# Patient Record
Sex: Female | Born: 1950 | Race: White | Hispanic: No | Marital: Married | Smoking: Never smoker
Health system: Southern US, Community
[De-identification: ages and names within clinical notes are randomized; demographics above are authoritative.]

## PROBLEM LIST (undated history)

## (undated) DIAGNOSIS — T7840XA Allergy, unspecified, initial encounter: Secondary | ICD-10-CM

## (undated) DIAGNOSIS — N189 Chronic kidney disease, unspecified: Secondary | ICD-10-CM

## (undated) DIAGNOSIS — E785 Hyperlipidemia, unspecified: Secondary | ICD-10-CM

## (undated) DIAGNOSIS — M1990 Unspecified osteoarthritis, unspecified site: Secondary | ICD-10-CM

## (undated) DIAGNOSIS — K219 Gastro-esophageal reflux disease without esophagitis: Secondary | ICD-10-CM

## (undated) DIAGNOSIS — H269 Unspecified cataract: Secondary | ICD-10-CM

## (undated) DIAGNOSIS — K645 Perianal venous thrombosis: Secondary | ICD-10-CM

## (undated) DIAGNOSIS — H8109 Meniere's disease, unspecified ear: Secondary | ICD-10-CM

## (undated) DIAGNOSIS — Z780 Asymptomatic menopausal state: Secondary | ICD-10-CM

## (undated) DIAGNOSIS — H2510 Age-related nuclear cataract, unspecified eye: Secondary | ICD-10-CM

## (undated) HISTORY — DX: Chronic kidney disease, unspecified: N18.9

## (undated) HISTORY — DX: Unspecified osteoarthritis, unspecified site: M19.90

## (undated) HISTORY — DX: Gastro-esophageal reflux disease without esophagitis: K21.9

## (undated) HISTORY — DX: Hyperlipidemia, unspecified: E78.5

## (undated) HISTORY — DX: Asymptomatic menopausal state: Z78.0

## (undated) HISTORY — DX: Meniere's disease, unspecified ear: H81.09

## (undated) HISTORY — DX: Perianal venous thrombosis: K64.5

## (undated) HISTORY — DX: Allergy, unspecified, initial encounter: T78.40XA

---
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
## 1999-05-05 ENCOUNTER — Other Ambulatory Visit: Admission: RE | Admit: 1999-05-05 | Discharge: 1999-05-05 | Payer: Self-pay | Admitting: Obstetrics and Gynecology

## 2000-05-10 ENCOUNTER — Other Ambulatory Visit: Admission: RE | Admit: 2000-05-10 | Discharge: 2000-05-10 | Payer: Self-pay | Admitting: Obstetrics and Gynecology

## 2004-02-05 ENCOUNTER — Ambulatory Visit: Payer: Self-pay | Admitting: Internal Medicine

## 2004-02-16 ENCOUNTER — Ambulatory Visit: Payer: Self-pay | Admitting: Internal Medicine

## 2004-03-26 ENCOUNTER — Ambulatory Visit: Payer: Self-pay | Admitting: Internal Medicine

## 2004-04-02 ENCOUNTER — Ambulatory Visit: Payer: Self-pay | Admitting: Internal Medicine

## 2004-04-02 ENCOUNTER — Ambulatory Visit: Payer: Self-pay

## 2004-04-14 ENCOUNTER — Ambulatory Visit: Payer: Self-pay | Admitting: Internal Medicine

## 2004-04-29 ENCOUNTER — Ambulatory Visit: Payer: Self-pay | Admitting: Internal Medicine

## 2004-07-15 ENCOUNTER — Ambulatory Visit: Payer: Self-pay | Admitting: Internal Medicine

## 2004-07-30 ENCOUNTER — Ambulatory Visit: Payer: Self-pay | Admitting: Internal Medicine

## 2004-08-13 ENCOUNTER — Encounter: Payer: Self-pay | Admitting: Internal Medicine

## 2004-08-13 ENCOUNTER — Ambulatory Visit: Payer: Self-pay | Admitting: Internal Medicine

## 2004-08-13 ENCOUNTER — Encounter (INDEPENDENT_AMBULATORY_CARE_PROVIDER_SITE_OTHER): Payer: Self-pay | Admitting: Specialist

## 2004-08-13 LAB — HM COLONOSCOPY

## 2004-08-20 ENCOUNTER — Ambulatory Visit: Payer: Self-pay | Admitting: Internal Medicine

## 2004-09-30 ENCOUNTER — Ambulatory Visit: Payer: Self-pay | Admitting: Internal Medicine

## 2004-12-16 ENCOUNTER — Ambulatory Visit: Payer: Self-pay | Admitting: Internal Medicine

## 2004-12-23 ENCOUNTER — Ambulatory Visit: Payer: Self-pay | Admitting: Family Medicine

## 2005-01-08 ENCOUNTER — Ambulatory Visit: Payer: Self-pay | Admitting: Family Medicine

## 2005-02-10 ENCOUNTER — Ambulatory Visit: Payer: Self-pay | Admitting: Internal Medicine

## 2005-02-21 ENCOUNTER — Ambulatory Visit: Payer: Self-pay | Admitting: Internal Medicine

## 2005-03-15 ENCOUNTER — Ambulatory Visit: Payer: Self-pay | Admitting: Internal Medicine

## 2005-03-22 ENCOUNTER — Ambulatory Visit: Payer: Self-pay | Admitting: Internal Medicine

## 2005-06-27 ENCOUNTER — Ambulatory Visit: Payer: Self-pay | Admitting: Internal Medicine

## 2005-07-14 ENCOUNTER — Ambulatory Visit: Payer: Self-pay | Admitting: Internal Medicine

## 2005-08-11 ENCOUNTER — Ambulatory Visit: Payer: Self-pay | Admitting: Internal Medicine

## 2005-10-05 ENCOUNTER — Ambulatory Visit: Payer: Self-pay | Admitting: Internal Medicine

## 2005-10-20 ENCOUNTER — Ambulatory Visit: Payer: Self-pay | Admitting: Internal Medicine

## 2006-01-05 ENCOUNTER — Ambulatory Visit: Payer: Self-pay | Admitting: Internal Medicine

## 2006-02-24 ENCOUNTER — Ambulatory Visit: Payer: Self-pay | Admitting: Internal Medicine

## 2006-02-24 LAB — CONVERTED CEMR LAB
AST: 25 units/L (ref 0–37)
Albumin: 3.9 g/dL (ref 3.5–5.2)
Alkaline Phosphatase: 79 units/L (ref 39–117)
Direct LDL: 154.5 mg/dL
Total CHOL/HDL Ratio: 3.9
VLDL: 15 mg/dL (ref 0–40)

## 2006-03-03 ENCOUNTER — Ambulatory Visit: Payer: Self-pay | Admitting: Internal Medicine

## 2006-05-02 ENCOUNTER — Ambulatory Visit: Payer: Self-pay | Admitting: Internal Medicine

## 2006-05-02 LAB — CONVERTED CEMR LAB: Cholesterol: 188 mg/dL (ref 0–200)

## 2006-05-09 ENCOUNTER — Ambulatory Visit: Payer: Self-pay | Admitting: Internal Medicine

## 2006-06-26 ENCOUNTER — Ambulatory Visit: Payer: Self-pay | Admitting: Internal Medicine

## 2006-08-14 ENCOUNTER — Telehealth: Payer: Self-pay | Admitting: Internal Medicine

## 2006-10-23 ENCOUNTER — Telehealth: Payer: Self-pay | Admitting: Internal Medicine

## 2006-10-25 ENCOUNTER — Ambulatory Visit: Payer: Self-pay | Admitting: Internal Medicine

## 2006-10-25 DIAGNOSIS — T887XXA Unspecified adverse effect of drug or medicament, initial encounter: Secondary | ICD-10-CM | POA: Insufficient documentation

## 2006-10-25 DIAGNOSIS — E785 Hyperlipidemia, unspecified: Secondary | ICD-10-CM

## 2006-10-25 LAB — CONVERTED CEMR LAB
ALT: 25 units/L (ref 0–35)
Albumin: 4.1 g/dL (ref 3.5–5.2)
Alkaline Phosphatase: 58 units/L (ref 39–117)
Bilirubin, Direct: 0.1 mg/dL (ref 0.0–0.3)
Cholesterol: 193 mg/dL (ref 0–200)
HDL: 62.2 mg/dL (ref >39.0)
Total Bilirubin: 0.5 mg/dL (ref 0.3–1.2)
Total CHOL/HDL Ratio: 3.1
Total Protein: 6.8 g/dL (ref 6.0–8.3)

## 2006-11-03 ENCOUNTER — Ambulatory Visit: Payer: Self-pay | Admitting: Internal Medicine

## 2006-11-03 LAB — CONVERTED CEMR LAB: HDL goal, serum: 40 mg/dL

## 2006-11-07 ENCOUNTER — Telehealth: Payer: Self-pay | Admitting: *Deleted

## 2006-11-20 ENCOUNTER — Telehealth: Payer: Self-pay | Admitting: Internal Medicine

## 2006-12-02 ENCOUNTER — Telehealth: Payer: Self-pay | Admitting: Internal Medicine

## 2006-12-19 ENCOUNTER — Encounter: Admission: RE | Admit: 2006-12-19 | Discharge: 2006-12-19 | Payer: Self-pay | Admitting: Internal Medicine

## 2006-12-29 ENCOUNTER — Telehealth: Payer: Self-pay | Admitting: Internal Medicine

## 2007-01-08 ENCOUNTER — Ambulatory Visit: Payer: Self-pay | Admitting: Family Medicine

## 2007-01-08 ENCOUNTER — Encounter: Payer: Self-pay | Admitting: Internal Medicine

## 2007-03-22 ENCOUNTER — Telehealth: Payer: Self-pay | Admitting: Internal Medicine

## 2007-05-04 ENCOUNTER — Telehealth: Payer: Self-pay | Admitting: Internal Medicine

## 2007-05-08 ENCOUNTER — Telehealth (INDEPENDENT_AMBULATORY_CARE_PROVIDER_SITE_OTHER): Payer: Self-pay | Admitting: *Deleted

## 2007-05-16 ENCOUNTER — Encounter: Payer: Self-pay | Admitting: Internal Medicine

## 2007-06-07 ENCOUNTER — Ambulatory Visit: Payer: Self-pay | Admitting: Internal Medicine

## 2007-06-07 DIAGNOSIS — F325 Major depressive disorder, single episode, in full remission: Secondary | ICD-10-CM | POA: Insufficient documentation

## 2007-07-19 ENCOUNTER — Ambulatory Visit: Payer: Self-pay | Admitting: Internal Medicine

## 2007-10-01 ENCOUNTER — Ambulatory Visit: Payer: Self-pay | Admitting: Internal Medicine

## 2007-10-01 DIAGNOSIS — M25539 Pain in unspecified wrist: Secondary | ICD-10-CM | POA: Insufficient documentation

## 2007-10-22 ENCOUNTER — Ambulatory Visit: Payer: Self-pay | Admitting: Internal Medicine

## 2007-10-22 LAB — CONVERTED CEMR LAB
Bilirubin Urine: NEGATIVE
Glucose, Urine, Semiquant: NEGATIVE
Ketones, urine, test strip: NEGATIVE
Nitrite: NEGATIVE
Protein, U semiquant: NEGATIVE
Specific Gravity, Urine: 1.02
Urobilinogen, UA: 0.2
pH: 6

## 2007-12-12 ENCOUNTER — Telehealth: Payer: Self-pay | Admitting: Internal Medicine

## 2008-01-09 ENCOUNTER — Ambulatory Visit: Payer: Self-pay | Admitting: Internal Medicine

## 2008-01-09 DIAGNOSIS — K219 Gastro-esophageal reflux disease without esophagitis: Secondary | ICD-10-CM

## 2008-01-09 DIAGNOSIS — K645 Perianal venous thrombosis: Secondary | ICD-10-CM

## 2008-01-09 DIAGNOSIS — K589 Irritable bowel syndrome without diarrhea: Secondary | ICD-10-CM | POA: Insufficient documentation

## 2008-01-09 HISTORY — DX: Perianal venous thrombosis: K64.5

## 2008-01-30 ENCOUNTER — Ambulatory Visit: Payer: Self-pay | Admitting: Internal Medicine

## 2008-01-30 LAB — CONVERTED CEMR LAB
Albumin: 4.2 g/dL (ref 3.5–5.2)
BUN: 23 mg/dL (ref 6–23)
Calcium: 9.9 mg/dL (ref 8.4–10.5)
Chloride: 105 meq/L (ref 96–112)

## 2008-02-06 ENCOUNTER — Ambulatory Visit: Payer: Self-pay | Admitting: Internal Medicine

## 2008-04-04 ENCOUNTER — Ambulatory Visit: Payer: Self-pay | Admitting: Internal Medicine

## 2008-04-07 ENCOUNTER — Telehealth: Payer: Self-pay | Admitting: Internal Medicine

## 2008-06-19 ENCOUNTER — Telehealth (INDEPENDENT_AMBULATORY_CARE_PROVIDER_SITE_OTHER): Payer: Self-pay | Admitting: *Deleted

## 2008-06-30 ENCOUNTER — Ambulatory Visit: Payer: Self-pay | Admitting: Internal Medicine

## 2008-07-08 ENCOUNTER — Encounter: Payer: Self-pay | Admitting: Internal Medicine

## 2008-07-30 ENCOUNTER — Encounter: Payer: Self-pay | Admitting: Internal Medicine

## 2008-08-08 ENCOUNTER — Ambulatory Visit: Payer: Self-pay | Admitting: Internal Medicine

## 2008-08-08 LAB — CONVERTED CEMR LAB
Cholesterol: 183 mg/dL (ref 0–200)
Direct LDL: 90.3 mg/dL
HDL: 72.4 mg/dL (ref >39.00)

## 2008-08-12 ENCOUNTER — Encounter: Payer: Self-pay | Admitting: Internal Medicine

## 2008-08-19 ENCOUNTER — Telehealth (INDEPENDENT_AMBULATORY_CARE_PROVIDER_SITE_OTHER): Payer: Self-pay | Admitting: *Deleted

## 2008-08-29 ENCOUNTER — Encounter: Payer: Self-pay | Admitting: Internal Medicine

## 2008-10-17 LAB — CONVERTED CEMR LAB: Pap Smear: NORMAL

## 2008-10-29 ENCOUNTER — Encounter: Payer: Self-pay | Admitting: Internal Medicine

## 2008-11-03 ENCOUNTER — Ambulatory Visit: Payer: Self-pay | Admitting: Internal Medicine

## 2008-11-03 LAB — CONVERTED CEMR LAB
AST: 25 units/L (ref 0–37)
Albumin: 4.4 g/dL (ref 3.5–5.2)
Alkaline Phosphatase: 46 units/L (ref 39–117)
BUN: 25 mg/dL — ABNORMAL HIGH (ref 6–23)
Bilirubin, Direct: 0 mg/dL (ref 0.0–0.3)
Calcium: 9.2 mg/dL (ref 8.4–10.5)
Creatinine, Ser: 1 mg/dL (ref 0.4–1.2)
GFR calc non Af Amer: 60.25 mL/min (ref >60)
Glucose, Bld: 97 mg/dL (ref 70–99)
HCT: 39.6 % (ref 36.0–46.0)
HDL: 72.7 mg/dL (ref >39.00)
LDL Cholesterol: 102 mg/dL — ABNORMAL HIGH (ref 0–99)
Lymphs Abs: 1.4 10*3/uL (ref 0.7–4.0)
MCHC: 33.7 g/dL (ref 30.0–36.0)
MCV: 90.8 fL (ref 78.0–100.0)
Neutro Abs: 1.2 10*3/uL — ABNORMAL LOW (ref 1.4–7.7)
RBC: 4.36 M/uL (ref 3.87–5.11)
RDW: 12.3 % (ref 11.5–14.6)
TSH: 2.96 microintl units/mL (ref 0.35–5.50)
Total Bilirubin: 0.5 mg/dL (ref 0.3–1.2)
Total Protein: 7.4 g/dL (ref 6.0–8.3)
VLDL: 12.8 mg/dL (ref 0.0–40.0)
WBC: 3.1 10*3/uL — ABNORMAL LOW (ref 4.5–10.5)

## 2008-11-10 ENCOUNTER — Ambulatory Visit: Payer: Self-pay | Admitting: Internal Medicine

## 2009-01-28 ENCOUNTER — Ambulatory Visit: Payer: Self-pay | Admitting: Internal Medicine

## 2009-01-28 DIAGNOSIS — J218 Acute bronchiolitis due to other specified organisms: Secondary | ICD-10-CM | POA: Insufficient documentation

## 2009-01-30 ENCOUNTER — Telehealth: Payer: Self-pay | Admitting: Internal Medicine

## 2009-02-11 ENCOUNTER — Encounter: Payer: Self-pay | Admitting: Internal Medicine

## 2009-02-13 ENCOUNTER — Encounter: Payer: Self-pay | Admitting: Internal Medicine

## 2009-02-24 ENCOUNTER — Telehealth: Payer: Self-pay | Admitting: Internal Medicine

## 2009-03-23 ENCOUNTER — Telehealth: Payer: Self-pay | Admitting: Internal Medicine

## 2009-03-23 ENCOUNTER — Ambulatory Visit: Payer: Self-pay | Admitting: Internal Medicine

## 2009-04-14 ENCOUNTER — Ambulatory Visit: Payer: Self-pay | Admitting: Internal Medicine

## 2009-04-14 ENCOUNTER — Encounter: Payer: Self-pay | Admitting: Internal Medicine

## 2009-04-14 LAB — HM DEXA SCAN: HM Dexa Scan: NORMAL

## 2009-04-27 ENCOUNTER — Ambulatory Visit: Payer: Self-pay | Admitting: Internal Medicine

## 2009-04-27 DIAGNOSIS — R5383 Other fatigue: Secondary | ICD-10-CM

## 2009-04-27 DIAGNOSIS — R5381 Other malaise: Secondary | ICD-10-CM

## 2009-04-27 DIAGNOSIS — L659 Nonscarring hair loss, unspecified: Secondary | ICD-10-CM

## 2009-05-07 ENCOUNTER — Telehealth: Payer: Self-pay | Admitting: Internal Medicine

## 2009-05-29 ENCOUNTER — Encounter: Payer: Self-pay | Admitting: Internal Medicine

## 2009-07-22 ENCOUNTER — Encounter (INDEPENDENT_AMBULATORY_CARE_PROVIDER_SITE_OTHER): Payer: Self-pay | Admitting: *Deleted

## 2009-07-22 ENCOUNTER — Telehealth: Payer: Self-pay | Admitting: Internal Medicine

## 2009-09-28 ENCOUNTER — Telehealth: Payer: Self-pay | Admitting: Internal Medicine

## 2009-11-04 ENCOUNTER — Ambulatory Visit: Payer: Self-pay | Admitting: Internal Medicine

## 2009-11-04 LAB — CONVERTED CEMR LAB
AST: 34 units/L (ref 0–37)
Alkaline Phosphatase: 47 units/L (ref 39–117)
Basophils Absolute: 0.1 10*3/uL (ref 0.0–0.1)
Calcium: 10.7 mg/dL — ABNORMAL HIGH (ref 8.4–10.5)
Cholesterol: 193 mg/dL (ref 0–200)
Creatinine, Ser: 1 mg/dL (ref 0.4–1.2)
GFR calc non Af Amer: 60.04 mL/min (ref >60)
MCHC: 34.3 g/dL (ref 30.0–36.0)
Potassium: 4.4 meq/L (ref 3.5–5.1)
RBC: 4.16 M/uL (ref 3.87–5.11)
RDW: 13.8 % (ref 11.5–14.6)
Total Bilirubin: 0.4 mg/dL (ref 0.3–1.2)
Triglycerides: 68 mg/dL (ref 0.0–149.0)
VLDL: 13.6 mg/dL (ref 0.0–40.0)

## 2009-11-24 ENCOUNTER — Ambulatory Visit: Payer: Self-pay | Admitting: Internal Medicine

## 2009-11-25 ENCOUNTER — Telehealth: Payer: Self-pay | Admitting: Internal Medicine

## 2010-01-27 ENCOUNTER — Ambulatory Visit
Admission: RE | Admit: 2010-01-27 | Discharge: 2010-01-27 | Payer: Self-pay | Source: Home / Self Care | Attending: Internal Medicine | Admitting: Internal Medicine

## 2010-01-28 ENCOUNTER — Telehealth: Payer: Self-pay | Admitting: Internal Medicine

## 2010-02-05 ENCOUNTER — Telehealth: Payer: Self-pay | Admitting: Internal Medicine

## 2010-02-16 NOTE — Progress Notes (Signed)
Summary: numbness  Phone Note Call from Patient   Caller: Patient Call For: Stacie Glaze MD Summary of Call: Pt states she has had numbness of top of left ear that radiates to the back of her neck with a "pulling".  Her lips feel slightly numb on the left side also.  Symptoms started Sunday morning.  No weakness in extremity or difficulty with speech.  No eye droop.  Smile is symmetrical. Initial call taken by: Lynann Beaver CMA,  February 24, 2009 8:15 AM  Follow-up for Phone Call        could be bells palsy  monter for progressive symtoms is virla and ther is little to do to prevent any arm or leg numbness call back asap Follow-up by: Stacie Glaze MD,  February 24, 2009 10:02 AM  Additional Follow-up for Phone Call Additional follow up Details #1::        Pt. given Dr. York Ram recommendations, and she understands. Additional Follow-up by: Lynann Beaver CMA,  February 24, 2009 10:13 AM

## 2010-02-16 NOTE — Assessment & Plan Note (Signed)
Summary: HEAD/CHEST CONGESTION/FEELING WEAK/RCD   Vital Signs:  Patient profile:   60 year old female Height:      66 inches Weight:      164 pounds BMI:     26.57 Temp:     98.1 degrees F oral Pulse rate:   72 / minute Resp:     14 per minute BP sitting:   140 / 80  (left arm)  Vitals Entered By: Willy Eddy, LPN (January 28, 2009 9:55 AM) CC: c/o cough and congestion   CC:  c/o cough and congestion.  History of Present Illness: cough and cold for 1 weeks with muscinex and cold eaze, now chest is tight and cough results in asthma ( reactive airway) with wheezing no fever, but has chills cough is clear, slightly productive mild SOB, no leg sweeling of CV symptoms    Preventive Screening-Counseling & Management  Alcohol-Tobacco     Smoking Status: never     Passive Smoke Exposure: no  Problems Prior to Update: 1)  Preventive Health Care  (ICD-V70.0) 2)  Irritable Bowel Syndrome  (ICD-564.1) 3)  Hiatal Hernia With Reflux  (ICD-553.3) 4)  Internal Thrombosed Hemorrhoids  (ICD-455.1) 5)  Uti  (ICD-599.0) 6)  Wrist Pain, Right, Chronic  (ICD-719.43) 7)  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 Once Daily 2)  Mucinex Dm 30-600 Mg  Tb12 (Dextromethorphan-Guaifenesin) .... Two Times A Day As Needed 3)  Zyrtec Allergy 10 Mg  Tabs (Cetirizine Hcl) .... Once Daily 4)  Maxzide-25 37.5-25 Mg  Tabs (Triamterene-Hctz) .... Once Daily 5)  Tricor 145 Mg  Tabs (Fenofibrate) .... Once Daily 6)  Valium 5 Mg  Tabs (Diazepam) .... Two Times A Day As Needed 7)  Astelin 137 Mcg/spray  Soln (Azelastine Hcl) .... 2spray Each Nostril Once Daily 8)  Pristiq 100 Mg Xr24h-Tab (Desvenlafaxine Succinate) .... One By Mouth Daily 9)  Proctocort 30 Mg Supp (Hydrocortisone Acetate) .Marland Kitchen.. 1 Two Times A Day As Needed Hemorrhoidal  Painr 10)  Probiotic  Caps (Misc Intestinal Flora Regulat) .Marland Kitchen.. 1 Once Daily 11)  Ciprofloxacin Hcl 500 Mg Tabs (Ciprofloxacin Hcl) .... One Tablet Twice Daily  Current Medications (verified): 1)  Singulair 10 Mg  Tabs (Montelukast Sodium) .... Once Daily 2)  Mucinex Dm 30-600 Mg  Tb12 (Dextromethorphan-Guaifenesin) .... Two Times A Day As Needed 3)  Zyrtec Allergy 10 Mg  Tabs (Cetirizine Hcl) .... Once Daily 4)  Maxzide-25 37.5-25 Mg  Tabs (Triamterene-Hctz) .... Once Daily 5)  Tricor 145 Mg  Tabs (Fenofibrate) .... Once Daily 6)  Valium 5 Mg  Tabs (Diazepam) .... Two Times A Day As Needed 7)  Astelin 137 Mcg/spray  Soln (Azelastine Hcl) .... 2spray Each Nostril Once Daily 8)  Pristiq 100 Mg Xr24h-Tab (Desvenlafaxine Succinate) .... One By Mouth Daily 9)  Proctocort 30 Mg Supp (Hydrocortisone Acetate) .Marland Kitchen.. 1 Two Times A Day As Needed Hemorrhoidal Painr 10)  Probiotic  Caps (Misc Intestinal Flora Regulat) .Marland Kitchen.. 1 Once Daily 11)  Ciprofloxacin Hcl 500 Mg Tabs (Ciprofloxacin Hcl) .... One Tablet Twice Daily  Allergies (verified): 1)  ! Sulfa 2)  ! Erythromycin  Past History:  Family History: Last updated: 07/17/2006 Family History of Cardiovascular disorder  Social History: Last updated: 07/17/2006 Married Never Smoked Alcohol use-no  Risk Factors: Caffeine Use: 1 (11/03/2006)  Risk Factors: Smoking Status: never (01/28/2009) Passive  Smoke Exposure: no (01/28/2009)  Past medical, surgical, family and social histories (including risk factors) reviewed, and no changes noted (except as noted below).  Past Medical History: Reviewed history from 11/03/2006 and no changes required. Menopause Meniere's dz Hyperlipidemia  Family History: Reviewed history from 07/17/2006 and no changes required. Family History of Cardiovascular disorder  Social History: Reviewed history from 07/17/2006 and no changes required. Married Never Smoked Alcohol use-no  Review of Systems        The patient complains of dyspnea on exertion and prolonged cough.  The patient denies anorexia, fever, weight loss, weight gain, vision loss, decreased hearing, hoarseness, chest pain, syncope, peripheral edema, headaches, hemoptysis, abdominal pain, melena, hematochezia, severe indigestion/heartburn, hematuria, incontinence, genital sores, muscle weakness, suspicious skin lesions, transient blindness, difficulty walking, depression, unusual weight change, abnormal bleeding, enlarged lymph nodes, angioedema, and breast masses.    Physical Exam  General:  Well-developed,well-nourished,in no acute distress; alert,appropriate and cooperative throughout examination Head:  normocephalic and no abnormalities palpated.   Eyes:  pupils equal and pupils round.   Ears:  R ear normal and L ear normal.   Nose:  mucosal erythema, mucosal edema, and airflow obstruction.   Mouth:  posterior lymphoid hypertrophy, postnasal drip, and pharyngeal crowding.   Neck:  No deformities, masses, or tenderness noted. Lungs:  normal respiratory effort, R wheezes, and L wheezes.   Heart:  normal rate and regular rhythm.   Abdomen:  soft and non-tender.   Neurologic:  alert & oriented X3 and gait normal.   Skin:  turgor normal and color normal.   Psych:  Oriented X3 and good eye contact.     Impression & Recommendations:  Problem # 1:  ACUTE BRONCHIOLITIS DUE OTH INFECTIOUS ORGANISMS (ICD-466.19) Assessment New  The following medications were removed from the medication list:    Ciprofloxacin Hcl 500 Mg Tabs (Ciprofloxacin hcl) ..... One tablet twice daily Her updated medication list for this problem includes:    Singulair 10 Mg Tabs (Montelukast sodium) ..... Once daily    Mucinex Dm 30-600 Mg Tb12 (Dextromethorphan-guaifenesin) .Marland Kitchen..Marland Kitchen Two times a day as needed    Clarithromycin 500 Mg Xr24h-tab (Clarithromycin) ..... One by mouth two times a day with food    Hydromet 5-1.5 Mg/27ml Syrp (Hydrocodone-homatropine)  .Marland Kitchen..Marland Kitchen Two tsp by mouth q 6 hours  Orders: Depo- Medrol 80mg  (J1040) Admin of Therapeutic Inj  intramuscular or subcutaneous ([address])  Complete Medication List: 1)  Singulair 10 Mg Tabs (Montelukast sodium) .... Once daily 2)  Mucinex Dm 30-600 Mg Tb12 (Dextromethorphan-guaifenesin) .... Two times a day as needed 3)  Zyrtec Allergy 10 Mg Tabs (Cetirizine hcl) .... Once daily 4)  Maxzide-25 37.5-25 Mg Tabs (Triamterene-hctz) .... Once daily 5)  Tricor 145 Mg Tabs (Fenofibrate) .... Once daily 6)  Valium 5 Mg Tabs (Diazepam) .... Two times a day as needed 7)  Astelin 137 Mcg/spray Soln (Azelastine hcl) .... 2spray each nostril once daily 8)  Pristiq 100 Mg Xr24h-tab (Desvenlafaxine succinate) .... One by mouth daily 9)  Proctocort 30 Mg Supp (Hydrocortisone acetate) .Marland Kitchen.. 1 two times a day as needed hemorrhoidal painr 10)  Probiotic Caps (Misc intestinal flora regulat) .Marland Kitchen.. 1 once daily 11)  Clarithromycin 500 Mg Xr24h-tab (Clarithromycin) .... One by mouth two times a day with food 12)  Methylprednisolone 4 Mg Tabs (Methylprednisolone) .... 5 for one day then 4 for 1 days then 3 for 1 day then 2 for 1 day then 1 for 1 days 13)  Hydromet 5-1.5 Mg/70ml Syrp (  Hydrocodone-homatropine) .... Two tsp by mouth q 6 hours  Patient Instructions: 1)  Take your antibiotic as prescribed until ALL of it is gone, but stop if you develop a rash or swelling and contact our office as soon as possible. Prescriptions: HYDROMET 5-1.5 MG/5ML SYRP (HYDROCODONE-HOMATROPINE) two tsp by mouth q 6 hours  #6 oz x 1   Entered and Authorized by:   Stacie Glaze MD   Signed by:   Stacie Glaze MD on 01/28/2009   Method used:   Print then Give to Patient   RxID:   [id number] METHYLPREDNISOLONE 4 MG TABS (METHYLPREDNISOLONE) 5 for one day then 4 for 1 days then 3 for 1 day then 2 for 1 day then 1 for 1 days  #15 x 0   Entered and Authorized by:   Stacie Glaze MD   Signed by:   Stacie Glaze MD on 01/28/2009    Method used:   Electronically to        [facility]  [phone]* (retail)       [address] [place], [place]  [address]       Ph: [phone] or [phone]       Fax: [phone]   RxID:   [id number] CLARITHROMYCIN 500 MG XR24H-TAB (CLARITHROMYCIN) one by mouth two times a day with food  #20 x 0   Entered and Authorized by:   Stacie Glaze MD   Signed by:   Stacie Glaze MD on 01/28/2009   Method used:   Electronically to        [facility]  [phone]* (retail)       [address] [place], [place]  [address]       Ph: [phone] or [phone]       Fax: [phone]   RxID:   [id number]    Medication Administration  Injection # 1:    Medication: Depo- Medrol 80mg     Diagnosis: ACUTE BRONCHIOLITIS DUE OTH INFECTIOUS ORGANISMS (ICD-466.19)    Route: IM    Site: LUOQ gluteus    Exp Date: 10/27/2009    Lot #: [id number]    Mfr: Oswaldo Done    Given by: Stacie Glaze MD (January 28, 2009 10:48 AM)  Orders Added: 1)  Est. Patient Level IV [37106] 2)  Depo- Medrol 80mg  [J1040] 3)  Admin of Therapeutic Inj  intramuscular or subcutaneous [26948]

## 2010-02-16 NOTE — Medication Information (Signed)
Summary: Coverage Approval for Singulair Tablet  Coverage Approval for Singulair Tablet   Imported By: Maryln Gottron 06/05/2009 10:54:27  _____________________________________________________________________  External Attachment:    Type:   Image     Comment:   External Document

## 2010-02-16 NOTE — Progress Notes (Signed)
Summary: refill  Phone Note Refill Request Call back at Home Phone [phone] Message from:  Patient---live call  Refills Requested: Medication #1:  SINGULAIR 10 MG  TABS once daily fax to [facility]  Initial call taken by: Warnell Forester,  May 07, 2009 1:28 PM    Prescriptions: SINGULAIR 10 MG  TABS (MONTELUKAST SODIUM) once daily  #90 x 3   Entered by:   Willy Eddy, LPN   Authorized by:   Stacie Glaze MD   Signed by:   Willy Eddy, LPN on 29/56/2130   Method used:   Electronically to        [facility]* (mail-order)             ,          Ph: [phone]       Fax: [phone]   RxID:   [id number]

## 2010-02-16 NOTE — Progress Notes (Signed)
Summary: questions x3  Phone Note Call from Patient Call back at Home Phone [phone]   Caller: vm Call For: Zeven Kocak Reason for Call: Acute Illness Summary of Call: Wed ov shot & meds given. 1) Can I take Mucinex with other meds?  2)Am I contagious & how long? Started med Wed.   3)When will I start getting some energy back?  I still don't have any. Initial call taken by: Rudy Jew, RN,  January 30, 2009 11:50 AM  Follow-up for Phone Call        GIve it time-- she needs to rest and take it easy-can take mucinex,(per dr Cato Mulligan) but take as directed--drink plenty of liquids and take it easy Follow-up by: Willy Eddy, LPN,  January 30, 2009 11:53 AM  Additional Follow-up for Phone Call Additional follow up Details #1::        Phone Call Completed Additional Follow-up by: Rudy Jew, RN,  January 30, 2009 2:36 PM

## 2010-02-16 NOTE — Progress Notes (Signed)
Summary: Pt req refill of Valium, Maxzide and Tricor to [facility]  Phone Note Refill Request Call back at Home Phone [phone] Message from:  Patient on July 22, 2009 11:27 AM  Refills Requested: Medication #1:  VALIUM 5 MG  TABS two times a day as needed   Dosage confirmed as above?Dosage Confirmed   Brand Name Necessary? No   Supply Requested: 3 months  Medication #2:  MAXZIDE-25 37.5-25 MG  TABS once daily   Dosage confirmed as above?Dosage Confirmed   Brand Name Necessary? No   Supply Requested: 3 months  Medication #3:  TRICOR 145 MG  TABS once daily   Dosage confirmed as above?Dosage Confirmed   Brand Name Necessary? Yes   Supply Requested: 3 months Please send to [facility] and be sure to include pts member Id and dob.   Fax # [phone]  Phone [phone]   Method Requested: Electronic Initial call taken by: Lucy Antigua,  July 22, 2009 11:29 AM    Prescriptions: VALIUM 5 MG  TABS (DIAZEPAM) two times a day as needed  #180 x 1   Entered by:   Willy Eddy, LPN   Authorized by:   Stacie Glaze MD   Signed by:   Willy Eddy, LPN on 84/69/6295   Method used:   Printed then faxed to ...       [facility]* (retail)             ,          Ph: [phone]       Fax: [phone]   RxID:   [id number] MAXZIDE-25 37.5-25 MG  TABS (TRIAMTERENE-HCTZ) once daily  #90 x 3   Entered by:   Willy Eddy, LPN   Authorized by:   Stacie Glaze MD   Signed by:   Willy Eddy, LPN on 33/29/5188   Method used:   Electronically to        [facility]* (retail)             ,          Ph: [phone]       Fax: [phone]   RxID:   [id number] TRICOR 145 MG  TABS (FENOFIBRATE) once daily  #90 x 3   Entered by:   Willy Eddy, LPN   Authorized by:   Stacie Glaze MD   Signed by:   Willy Eddy, LPN on 83/15/1761   Method used:   Electronically to        [facility]* (retail)             ,           Ph: [phone]       Fax: [phone]   RxID:   [id number]

## 2010-02-16 NOTE — Assessment & Plan Note (Signed)
Summary: uri/bmw   Vital Signs:  Patient profile:   60 year old female Height:      66 inches Weight:      164 pounds BMI:     26.57 Temp:     98. degrees F oral Pulse rate:   72 / minute Resp:     14 per minute BP sitting:   130 / 80  (left arm)  Vitals Entered By: Willy Eddy, LPN (March 23, 1608 3:53 PM) CC: c/o head congestion, non produtive cough for about5-6 days although beginning to get better, URI symptoms   CC:  c/o head congestion, non produtive cough for about5-6 days although beginning to get better, and URI symptoms.  History of Present Illness:  URI Symptoms      This is a 60 year old woman who presents with URI symptoms.  The patient reports nasal congestion and dry cough.  The patient denies fever, low-grade fever (<100.5 degrees), fever of 100.5-103 degrees, fever of 103.1-104 degrees, fever to >104 degrees, stiff neck, dyspnea, wheezing, rash, vomiting, diarrhea, use of an antipyretic, and response to antipyretic.  The patient also reports headache and severe fatigue.  The patient denies the following risk factors for Strep sinusitis: unilateral facial pain, unilateral nasal discharge, poor response to decongestant, double sickening, tooth pain, Strep exposure, tender adenopathy, and absence of cough.    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) 10)  Advef, Drug/medicinal/biological Subst Nos  (ICD-995.20) 11)  Hyperlipidemia  (ICD-272.4)  Current Problems (verified): 1)  Wheezing   (ICD-786.07) 2)  Acute Bronchiolitis Due Oth Infectious Organisms  (ICD-466.19) 3)  Preventive Health Care  (ICD-V70.0) 4)  Irritable Bowel Syndrome  (ICD-564.1) 5)  Hiatal Hernia With Reflux  (ICD-553.3) 6)  Internal Thrombosed Hemorrhoids  (ICD-455.1) 7)  Uti  (ICD-599.0) 8)  Wrist Pain, Right, Chronic  (ICD-719.43) 9)  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 Once Daily 2)  Mucinex Dm 30-600 Mg  Tb12 (Dextromethorphan-Guaifenesin) .... Two Times A Day As Needed 3)  Zyrtec Allergy 10 Mg  Tabs (Cetirizine Hcl) .... Once Daily 4)  Maxzide-25 37.5-25 Mg  Tabs (Triamterene-Hctz) .... Once Daily 5)  Tricor 145 Mg  Tabs (Fenofibrate) .... Once Daily 6)  Valium 5 Mg  Tabs (Diazepam) .... Two Times A Day As Needed 7)  Astelin 137 Mcg/spray  Soln (Azelastine Hcl) .... 2spray Each Nostril Once Daily 8)  Pristiq 100 Mg Xr24h-Tab (Desvenlafaxine Succinate) .... One By Mouth Daily 9)  Proctocort 30 Mg Supp (Hydrocortisone Acetate) .Marland Kitchen.. 1 Two Times A Day As Needed Hemorrhoidal Painr 10)  Probiotic  Caps (Misc Intestinal Flora Regulat) .Marland Kitchen.. 1 Once Daily 11)  Clarithromycin 500 Mg Xr24h-Tab (Clarithromycin) .... One By Mouth Two Times A Day With Food 12)  Methylprednisolone 4 Mg Tabs (Methylprednisolone) .... 5 For One Day Then 4 For 1 Days Then 3 For  1 Day Then 2 For 1 Day Then 1 For 1 Days 13)  Hydromet 5-1.5 Mg/63ml Syrp (Hydrocodone-Homatropine) .... Two Tsp By Mouth Q 6 Hours  Current Medications (verified): 1)  Singulair 10 Mg  Tabs (Montelukast Sodium) .... Once Daily 2)  Mucinex Dm 30-600 Mg  Tb12 (Dextromethorphan-Guaifenesin) .... Two Times A Day As Needed 3)  Zyrtec Allergy 10 Mg  Tabs (Cetirizine Hcl) .... Once Daily 4)  Maxzide-25 37.5-25 Mg  Tabs (Triamterene-Hctz) .... Once Daily 5)  Tricor 145 Mg  Tabs (Fenofibrate)  .... Once Daily 6)  Valium 5 Mg  Tabs (Diazepam) .... Two Times A Day As Needed 7)  Astelin 137 Mcg/spray  Soln (Azelastine Hcl) .... 2spray Each Nostril Once Daily 8)  Pristiq 100 Mg Xr24h-Tab (Desvenlafaxine Succinate) .... One By Mouth Daily 9)  Proctocort 30 Mg Supp (Hydrocortisone Acetate) .Marland Kitchen.. 1 Two Times A Day As Needed Hemorrhoidal Painr 10)  Probiotic  Caps (Misc Intestinal Flora Regulat) .Marland Kitchen.. 1 Once Daily 11)  Clarithromycin 500 Mg Xr24h-Tab (Clarithromycin) .... One By Mouth Two Times A Day With Food 12)  Methylprednisolone 4 Mg Tabs (Methylprednisolone) .... 5 For One Day Then 4 For 1 Days Then 3 For 1 Day Then 2 For 1 Day Then 1 For 1 Days 13)  Hydromet 5-1.5 Mg/11ml Syrp (Hydrocodone-Homatropine) .... Two Tsp By Mouth Q 6 Hours  Allergies (verified): 1)  ! Sulfa 2)  ! Erythromycin  Past History:  Family History: Last updated: 07/17/2006 Family History of Cardiovascular disorder  Social History: Last updated: 07/17/2006 Married Never Smoked Alcohol use-no  Risk Factors: Caffeine Use: 1 (11/03/2006)  Risk Factors: Smoking Status: never (03/23/2009) Passive Smoke Exposure: no (03/23/2009)  Past medical, surgical, family and social histories (including risk factors) reviewed, and no changes noted (except as noted below).  Past Medical History: Reviewed history from 11/03/2006 and no changes required. Menopause Meniere's dz Hyperlipidemia  Family History: Reviewed history from 07/17/2006 and no changes required. Family History of Cardiovascular disorder  Social History: Reviewed history from 07/17/2006 and no changes required. Married Never Smoked Alcohol use-no  Review of Systems  The patient denies anorexia, fever, weight loss, weight gain, vision loss, decreased hearing, hoarseness, chest pain, syncope, dyspnea on exertion, peripheral edema, prolonged cough, headaches, hemoptysis, abdominal pain, melena, hematochezia, severe indigestion/heartburn,  hematuria, incontinence, genital sores, muscle weakness, suspicious skin lesions, transient blindness, difficulty walking, depression, unusual weight change, abnormal bleeding, enlarged lymph nodes, angioedema, breast masses, and testicular masses.    Physical Exam  General:  Well-developed,well-nourished,in no acute distress; alert,appropriate and cooperative throughout examination Head:  normocephalic and no abnormalities palpated.   Eyes:  pupils equal and pupils round.   Ears:  R ear normal and L ear normal.   Nose:  mucosal erythema, mucosal edema, and airflow obstruction.   Mouth:  posterior lymphoid hypertrophy, postnasal drip, and pharyngeal crowding.   Neck:  No deformities, masses, or tenderness noted. Lungs:  normal respiratory effort, R wheezes, and L wheezes.   Heart:  normal rate and regular rhythm.   Abdomen:  soft and non-tender.   Msk:  No deformity or scoliosis noted of thoracic or lumbar spine.     Impression & Recommendations:  Problem # 1:  ACUTE BRONCHIOLITIS DUE OTH INFECTIOUS ORGANISMS (ICD-466.19)  The following medications were removed from the medication list:    Clarithromycin 500 Mg Xr24h-tab (Clarithromycin) ..... One by mouth two times a day with food Her updated medication list for this problem includes:    Singulair 10 Mg  Tabs (Montelukast sodium) ..... Once daily    Mucinex Dm 30-600 Mg Tb12 (Dextromethorphan-guaifenesin) .Marland Kitchen..Marland Kitchen Two times a day as needed    Hydromet 5-1.5 Mg/29ml Syrp (Hydrocodone-homatropine) .Marland Kitchen..Marland Kitchen Two tsp by mouth q 6 hours    Cefuroxime Axetil 250 Mg Tabs (Cefuroxime axetil) ..... One by mouth bid    Dm-pe-chlor 3-1.5-1 Mg/ml Liqd (Phenylephrine-chlorphen-dm) .Marland Kitchen..Marland Kitchen Two twp by mouth two times a day  Take antibiotics and other medications as directed. Encouraged to push clear liquids, get enough rest, and take acetaminophen as needed. To be seen in 5-7 days if no improvement, sooner if worse.  Complete Medication List: 1)  Singulair  10 Mg Tabs (Montelukast sodium) .... Once daily 2)  Mucinex Dm 30-600 Mg Tb12 (Dextromethorphan-guaifenesin) .... Two times a day as needed 3)  Zyrtec Allergy 10 Mg Tabs (Cetirizine hcl) .... Once daily 4)  Maxzide-25 37.5-25 Mg Tabs (Triamterene-hctz) .... Once daily 5)  Tricor 145 Mg Tabs (Fenofibrate) .... Once daily 6)  Valium 5 Mg Tabs (Diazepam) .... Two times a day as needed 7)  Astelin 137 Mcg/spray Soln (Azelastine hcl) .... 2spray each nostril once daily 8)  Pristiq 100 Mg Xr24h-tab (Desvenlafaxine succinate) .... One by mouth daily 9)  Proctocort 30 Mg Supp (Hydrocortisone acetate) .Marland Kitchen.. 1 two times a day as needed hemorrhoidal painr 10)  Probiotic Caps (Misc intestinal flora regulat) .Marland Kitchen.. 1 once daily 11)  Hydromet 5-1.5 Mg/46ml Syrp (Hydrocodone-homatropine) .... Two tsp by mouth q 6 hours 12)  Cefuroxime Axetil 250 Mg Tabs (Cefuroxime axetil) .... One by mouth bid 13)  Dm-pe-chlor 3-1.5-1 Mg/ml Liqd (Phenylephrine-chlorphen-dm) .... Two twp by mouth two times a day Prescriptions: DM-PE-CHLOR 3-1.5-1 MG/ML LIQD (PHENYLEPHRINE-CHLORPHEN-DM) two twp by mouth two times a day  #6 0z x 1   Entered and Authorized by:   Stacie Glaze MD   Signed by:   Stacie Glaze MD on 03/23/2009   Method used:   Electronically to        [facility]  [phone]* (retail)       [address]       [place], [place]  [address]       Ph: [phone] or [phone]       Fax: [phone]   RxID:   [id number] CEFUROXIME AXETIL 250 MG TABS (CEFUROXIME AXETIL) one by mouth BID  #20 x 0   Entered and Authorized by:   Stacie Glaze MD   Signed by:   Stacie Glaze MD on 03/23/2009   Method used:   Electronically to        [facility]  [phone]* (retail)       [address]       [place], [place]  [address]       Ph: [phone] or [phone]       Fax: [phone]   RxID:   [id number]

## 2010-02-16 NOTE — Letter (Signed)
Summary: [facility]   Imported By: Maryln Gottron 02/19/2009 09:28:45  _____________________________________________________________________  External Attachment:    Type:   Image     Comment:   External Document

## 2010-02-16 NOTE — Letter (Signed)
Summary: Colonoscopy Letter  [facility]  [address] [place], [place] [address]   Phone: [phone]  Fax: [phone]      July 22, 2009 MRN: [id number]   [facility] [address] [place], [place]  [address]   Dear Ms. Hopwood,   According to your medical record, it is time for you to schedule a Colonoscopy. The American Cancer Society recommends this procedure as a method to detect early colon cancer. Patients with a family history of colon cancer, or a personal history of colon polyps or inflammatory bowel disease are at increased risk.  This letter has beeen generated based on the recommendations made at the time of your procedure. If you feel that in your particular situation this may no longer apply, please contact our office.  Please call our office at [phone] to schedule this appointment or to update your records at your earliest convenience.  Thank you for cooperating with [place] to provide you with the very best care possible.   Sincerely,  Hedwig Morton. Juanda Chance, M.D.  [facility] [phone]

## 2010-02-16 NOTE — Assessment & Plan Note (Signed)
Summary: cpx--no pap//ccm---PT RSC (BMP) // RS rsc bmp/njr   Vital Signs:  Patient profile:   60 year old female Height:      66 inches Weight:      170 pounds BMI:     27.54 Temp:     98.2 degrees F oral Pulse rate:   72 / minute Resp:     14 per minute BP sitting:   120 / 76  (left arm)  Vitals Entered By: Willy Eddy, LPN (November 24, 2009 2:49 PM) CC: cpx-colonoscopy next month Is Patient Diabetic? No   Primary Care Provider:  Stacie Glaze MD  CC:  cpx-colonoscopy next month.  History of Present Illness: The pt was asked about all immunizations, health maint. services that are appropriate to their age and was given guidance on diet exercize  and weight management   Preventive Screening-Counseling & Management  Alcohol-Tobacco     Smoking Status: never     Passive Smoke Exposure: no     Tobacco Counseling: not indicated; no tobacco use  Problems Prior to Update: 1)  Malaise and Fatigue  (ICD-780.79) 2)  Alopecia  (ICD-704.00) 3)  Wheezing  (ICD-786.07) 4)  Acute Bronchiolitis Due Oth Infectious Organisms  (ICD-466.19) 5)  Preventive Health Care  (ICD-V70.0) 6)  Irritable Bowel Syndrome  (ICD-564.1) 7)  Hiatal Hernia With Reflux  (ICD-553.3) 8)  Internal Thrombosed Hemorrhoids  (ICD-455.1) 9)  Uti  (ICD-599.0) 10)  Wrist Pain, Right, Chronic  (ICD-719.43) 11)  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) 12)  Advef, Drug/medicinal/biological Subst Nos  (ICD-995.20) 13)  Hyperlipidemia  (ICD-272.4)  Current Problems (verified): 1)  Malaise and Fatigue  (ICD-780.79) 2)  Alopecia  (ICD-704.00) 3)  Wheezing  (ICD-786.07) 4)  Acute Bronchiolitis Due Oth Infectious Organisms  (ICD-466.19) 5)  Preventive Health Care  (ICD-V70.0) 6)  Irritable Bowel Syndrome  (ICD-564.1) 7)  Hiatal Hernia With Reflux  (ICD-553.3) 8)  Internal Thrombosed Hemorrhoids  (ICD-455.1) 9)  Uti  (ICD-599.0) 10)  Wrist Pain, Right, Chronic  (ICD-719.43) 11)  Dysthymic Disorder   (ICD-300.4) 12)  Advef, Drug/medicinal/biological Subst Nos  (ICD-995.20) 13)  Hyperlipidemia  (ICD-272.4)  Medications Prior to Update: 1)  Singulair 10 Mg  Tabs (Montelukast Sodium) .... Once Daily 2)  Mucinex Dm 30-600 Mg  Tb12 (Dextromethorphan-Guaifenesin) .... Two Times A Day As Needed 3)  Zyrtec Allergy 10 Mg  Tabs (Cetirizine Hcl) .... Once Daily 4)  Maxzide-25 37.5-25 Mg  Tabs (Triamterene-Hctz) .... Once Daily 5)  Tricor 145 Mg  Tabs (Fenofibrate) .... Once Daily 6)  Valium 5 Mg  Tabs (Diazepam) .... Two Times A Day As Needed 7)  Astelin 137 Mcg/spray  Soln (Azelastine Hcl) .... 2spray Each Nostril Once Daily 8)  Pristiq 100 Mg Xr24h-Tab (Desvenlafaxine Succinate) .... One By Mouth Daily 9)  Proctocort 30 Mg Supp (Hydrocortisone Acetate) .Marland Kitchen.. 1 Two Times A Day As Needed Hemorrhoidal Painr 10)  Probiotic  Caps (Misc Intestinal Flora Regulat) .Marland Kitchen.. 1 Once Daily 11)  Hydromet 5-1.5 Mg/5ml Syrp (Hydrocodone-Homatropine) .... Two Tsp By Mouth Q 6 Hours  Current Medications (verified): 1)  Singulair 10 Mg  Tabs (Montelukast Sodium) .... Once Daily 2)  Mucinex Dm 30-600 Mg  Tb12 (Dextromethorphan-Guaifenesin) .... Two Times A Day As Needed 3)  Zyrtec Allergy 10 Mg  Tabs (Cetirizine Hcl) .... Once Daily 4)  Maxzide-25 37.5-25 Mg  Tabs (Triamterene-Hctz) .... Once Daily 5)  Tricor 145 Mg  Tabs (Fenofibrate) .... Once Daily 6)  Valium 5 Mg  Tabs (Diazepam) .Marland KitchenMarland KitchenMarland Kitchen  Two Times A Day As Needed 7)  Astelin 137 Mcg/spray  Soln (Azelastine Hcl) .... 2spray Each Nostril Once Daily As Needed 8)  Proctocort 30 Mg Supp (Hydrocortisone Acetate) .Marland Kitchen.. 1 Two Times A Day As Needed Hemorrhoidal Painr 9)  Probiotic  Caps (Misc Intestinal Flora Regulat) .Marland Kitchen.. 1 Once Daily 10)  Calcium 1200 1200-1000 Mg-Unit Chew (Calcium Carbonate-Vit D-Min) .... One By Mouth Daily 11)  Meclizine Hcl 25 Mg Tabs (Meclizine Hcl) .... One By Mouth Three Times A Day As Needed Dizzyness  Allergies (verified): 1)  ! Sulfa 2)  !  Erythromycin  Past History:  Family History: Last updated: 07/17/2006 Family History of Cardiovascular disorder  Social History: Last updated: 07/17/2006 Married Never Smoked Alcohol use-no  Risk Factors: Caffeine Use: 1 (11/03/2006)  Risk Factors: Smoking Status: never (11/24/2009) Passive Smoke Exposure: no (11/24/2009)  Past medical, surgical, family and social histories (including risk factors) reviewed, and no changes noted (except as noted below).  Past Medical History: Reviewed history from 11/03/2006 and no changes required. Menopause Meniere's dz Hyperlipidemia  Family History: Reviewed history from 07/17/2006 and no changes required. Family History of Cardiovascular disorder  Social History: Reviewed history from 07/17/2006 and no changes required. Married Never Smoked Alcohol use-no  Review of Systems  The patient denies anorexia, fever, weight loss, weight gain, vision loss, decreased hearing, hoarseness, chest pain, syncope, dyspnea on exertion, peripheral edema, prolonged cough, headaches, hemoptysis, abdominal pain, melena, hematochezia, severe indigestion/heartburn, hematuria, incontinence, genital sores, muscle weakness, suspicious skin lesions, transient blindness, difficulty walking, depression, unusual weight change, abnormal bleeding, enlarged lymph nodes, angioedema, breast masses, and testicular masses.         Flu Vaccine Consent Questions     Do you have a history of severe allergic reactions to this vaccine? no    Any prior history of allergic reactions to egg and/or gelatin? no    Do you have a sensitivity to the preservative Thimersol? no    Do you have a past history of Guillan-Barre Syndrome? no    Do you currently have an acute febrile illness? no    Have you ever had a severe reaction to latex? no    Vaccine information given and explained to patient? yes    Are you currently pregnant? no    Lot Number:[id number]   Exp  Date:07/17/2010   Site Given  Left Deltoid IM   Physical Exam  General:  Well-developed,well-nourished,in no acute distress; alert,appropriate and cooperative throughout examination Head:  normocephalic and no abnormalities palpated.   Eyes:  pupils equal and pupils round.   Ears:  R ear normal and L ear normal.   Nose:  mucosal erythema, mucosal edema, and airflow obstruction.   Mouth:  posterior lymphoid hypertrophy, postnasal drip, and pharyngeal crowding.   Neck:  No deformities, masses, or tenderness noted. Lungs:  normal respiratory effort, R wheezes, and L wheezes.   Heart:  normal rate and regular rhythm.   Abdomen:  soft and non-tender.   Msk:  No deformity or scoliosis noted of thoracic or lumbar spine.   Pulses:  R radial normal, R femoral normal, R popliteal normal, and R posterior tibial normal.   Extremities:  trace left pedal edema and trace right pedal edema.   Neurologic:  alert & oriented X3 and gait normal.   Skin:  turgor normal and color normal.   Psych:  Oriented X3 and good eye contact.     Impression & Recommendations:  Problem # 1:  PREVENTIVE HEALTH  CARE (ICD-V70.0) The pt was asked about all immunizations, health maint. services that are appropriate to their age and was given guidance on diet exercize  and weight management  Mammogram: normal (01/16/2009) Pap smear: normal (11/13/2009) Colonoscopy: abnormal (08/13/2004) Td Booster: Historical (01/18/2000)   Flu Vax: Fluvax 3+ (11/24/2009)   Chol: 193 (11/04/2009)   HDL: 67.40 (11/04/2009)   LDL: 112 (11/04/2009)   TG: 68.0 (11/04/2009) TSH: 3.39 (11/04/2009)   Next mammogram due:: 01/2010 (11/24/2009) Next Colonoscopy due:: 08/2009 (11/10/2008)  Discussed using sunscreen, use of alcohol, drug use, self breast exam, routine dental care, routine eye care, schedule for GYN exam, routine physical exam, seat belts, multiple vitamins, osteoporosis prevention, adequate calcium intake in diet, recommendations for  immunizations, mammograms and Pap smears.  Discussed exercise and checking cholesterol.  Discussed gun safety, safe sex, and contraception.  Complete Medication List: 1)  Singulair 10 Mg Tabs (Montelukast sodium) .... Once daily 2)  Mucinex Dm 30-600 Mg Tb12 (Dextromethorphan-guaifenesin) .... Two times a day as needed 3)  Zyrtec Allergy 10 Mg Tabs (Cetirizine hcl) .... Once daily 4)  Maxzide-25 37.5-25 Mg Tabs (Triamterene-hctz) .... Once daily 5)  Tricor 145 Mg Tabs (Fenofibrate) .... Once daily 6)  Valium 5 Mg Tabs (Diazepam) .... Two times a day as needed 7)  Astelin 137 Mcg/spray Soln (Azelastine hcl) .... 2spray each nostril once daily as needed 8)  Proctocort 30 Mg Supp (Hydrocortisone acetate) .Marland Kitchen.. 1 two times a day as needed hemorrhoidal painr 9)  Probiotic Caps (Misc intestinal flora regulat) .Marland Kitchen.. 1 once daily 10)  Calcium 1200 1200-1000 Mg-unit Chew (Calcium carbonate-vit d-min) .... One by mouth daily 11)  Meclizine Hcl 25 Mg Tabs (Meclizine hcl) .... One by mouth three times a day as needed dizzyness  Other Orders: Admin 1st Vaccine ([address]) Flu Vaccine 70yrs + ([address]454)  Patient Instructions: 1)  Please schedule a follow-up appointment in 6 months. Prescriptions: MECLIZINE HCL 25 MG TABS (MECLIZINE HCL) one by mouth three times a day as needed dizzyness  #30 x 11   Entered and Authorized by:   Stacie Glaze MD   Signed by:   Stacie Glaze MD on 11/24/2009   Method used:   Electronically to        [facility]  [phone]* (retail)       [address] [place], [place]  [address]       Ph: [phone] or [phone]       Fax: [phone]   RxID:   [id number]    Orders Added: 1)  Admin 1st Vaccine [90471] 2)  Flu Vaccine 32yrs + [25956] 3)  Est. Patient 40-64 years [38756]       Preventive Care Screening  Mammogram:    Date:  01/16/2009    Next Due:  01/2010    Results:  normal   Pap Smear:    Date:  11/13/2009    Next Due:  11/2010     Results:  normal

## 2010-02-16 NOTE — Progress Notes (Signed)
Summary: DC Pristque  Phone Note Call from Patient Call back at Home Phone [phone]   Caller: Patient Call For: Stacie Glaze MD Summary of Call: Pt wants to stop Pristique.....Marland KitchenMarland KitchenShe is down to 50 mg.  She cannot afford it any longer.  Does she taper?  Initial call taken by: Lynann Beaver CMA,  September 28, 2009 1:35 PM  Follow-up for Phone Call        50 mg q o days x 10 days and stop Follow-up by: Lynann Beaver CMA,  September 28, 2009 2:55 PM  Additional Follow-up for Phone Call Additional follow up Details #1::        Notified pt. Additional Follow-up by: Lynann Beaver CMA,  September 28, 2009 5:05 PM

## 2010-02-16 NOTE — Assessment & Plan Note (Signed)
Summary: 6 month rov/njr   Vital Signs:  Patient profile:   60 year old female Height:      66 inches Weight:      167 pounds BMI:     27.05 Temp:     98.2 degrees F oral Pulse rate:   72 / minute Resp:     14 per minute BP sitting:   130 / 80  (left arm)  Vitals Entered By: Willy Eddy, LPN (April 27, 2009 3:30 PM) CC: roa- c/o hair thinning,fatigue and chronic constipation   CC:  roa- c/o hair thinning and fatigue and chronic constipation.  History of Present Illness: reveiwed the bone density results with recommendations for vit d and calcium stopped the tricor and  she still has constipation she used magcitrate and "cleaned out" now used milk of magnesium as needed" 2-3 times a weeks taking fiber cabsules... 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 Once Daily 2)  Mucinex Dm 30-600 Mg  Tb12 (Dextromethorphan-Guaifenesin) .... Two Times A Day As Needed 3)  Zyrtec Allergy 10 Mg  Tabs (Cetirizine Hcl) .... Once Daily 4)  Maxzide-25 37.5-25 Mg  Tabs (Triamterene-Hctz) .... Once Daily 5)  Tricor 145 Mg  Tabs (Fenofibrate) .... Once Daily 6)  Valium 5 Mg  Tabs (Diazepam) .... Two Times A Day As Needed 7)  Astelin 137  Mcg/spray  Soln (Azelastine Hcl) .... 2spray Each Nostril Once Daily 8)  Pristiq 100 Mg Xr24h-Tab (Desvenlafaxine Succinate) .... One By Mouth Daily 9)  Proctocort 30 Mg Supp (Hydrocortisone Acetate) .Marland Kitchen.. 1 Two Times A Day As Needed Hemorrhoidal Painr 10)  Probiotic  Caps (Misc Intestinal Flora Regulat) .Marland Kitchen.. 1 Once Daily 11)  Hydromet 5-1.5 Mg/59ml Syrp (Hydrocodone-Homatropine) .... Two Tsp By Mouth Q 6 Hours 12)  Cefuroxime Axetil 250 Mg Tabs (Cefuroxime Axetil) .... One By Mouth Bid 13)  Dm-Pe-Chlor 3-1.5-1 Mg/ml Liqd (Phenylephrine-Chlorphen-Dm) .... Two Twp By Mouth Two Times A Day  Current Medications (verified): 1)  Singulair 10 Mg  Tabs (Montelukast Sodium) .... Once Daily 2)  Mucinex Dm 30-600 Mg  Tb12 (Dextromethorphan-Guaifenesin) .... Two Times A Day As Needed 3)  Zyrtec Allergy 10 Mg  Tabs (Cetirizine Hcl) .... Once Daily 4)  Maxzide-25 37.5-25 Mg  Tabs (Triamterene-Hctz) .... Once Daily 5)  Tricor 145 Mg  Tabs (Fenofibrate) .... Once Daily 6)  Valium 5 Mg  Tabs (Diazepam) .... Two Times A Day As Needed 7)  Astelin 137 Mcg/spray  Soln (Azelastine Hcl) .... 2spray Each Nostril Once Daily 8)  Pristiq 100 Mg Xr24h-Tab (Desvenlafaxine Succinate) .... One By Mouth Daily 9)  Proctocort 30 Mg Supp (Hydrocortisone Acetate) .Marland Kitchen.. 1 Two Times A Day As Needed Hemorrhoidal Painr 10)  Probiotic  Caps (Misc Intestinal Flora Regulat) .Marland KitchenMarland KitchenMarland Kitchen 1  Once Daily 11)  Hydromet 5-1.5 Mg/18ml Syrp (Hydrocodone-Homatropine) .... Two Tsp By Mouth Q 6 Hours  Allergies (verified): 1)  ! Sulfa 2)  ! Erythromycin  Past History:  Family History: Last updated: 07/17/2006 Family History of Cardiovascular disorder  Social History: Last updated: 07/17/2006 Married Never Smoked Alcohol use-no  Risk Factors: Caffeine Use: 1 (11/03/2006)  Risk Factors: Smoking Status: never (04/27/2009) Passive Smoke Exposure: no (04/27/2009)  Past medical, surgical, family and social histories (including risk factors)  reviewed, and no changes noted (except as noted below).  Past Medical History: Reviewed history from 11/03/2006 and no changes required. Menopause Meniere's dz Hyperlipidemia  Family History: Reviewed history from 07/17/2006 and no changes required. Family History of Cardiovascular disorder  Social History: Reviewed history from 07/17/2006 and no changes required. Married Never Smoked Alcohol use-no  Review of Systems  The patient denies anorexia, fever, weight loss, weight gain, vision loss, decreased hearing, hoarseness, chest pain, syncope, dyspnea on exertion, peripheral edema, prolonged cough, headaches, hemoptysis, abdominal pain, melena, hematochezia, severe indigestion/heartburn, hematuria, incontinence, genital sores, muscle weakness, suspicious skin lesions, transient blindness, difficulty walking, depression, unusual weight change, abnormal bleeding, enlarged lymph nodes, angioedema, and breast masses.    Physical Exam  General:  Well-developed,well-nourished,in no acute distress; alert,appropriate and cooperative throughout examination Head:  normocephalic and no abnormalities palpated.   Eyes:  pupils equal and pupils round.   Ears:  R ear normal and L ear normal.   Nose:  mucosal erythema, mucosal edema, and airflow obstruction.   Mouth:  posterior lymphoid hypertrophy, postnasal drip, and pharyngeal crowding.   Neck:  No deformities, masses, or tenderness noted. Lungs:  normal respiratory effort, R wheezes, and L wheezes.   Heart:  normal rate and regular rhythm.   Abdomen:  soft and non-tender.     Impression & Recommendations:  Problem # 1:  DYSTHYMIC DISORDER (ICD-300.4) mood is stable  Problem # 2:  ALOPECIA (ICD-704.00)  glass of soy mild a day and biotin 600 two times a day  Orders: Venipuncture ([address]) TLB-TSH (Thyroid Stimulating Hormone) (84443-TSH) TLB-Sedimentation Rate (ESR) (85652-ESR)  Problem # 3:  MALAISE AND FATIGUE  (ICD-780.79) Assessment: Unchanged  Orders: Venipuncture (60454) TLB-TSH (Thyroid Stimulating Hormone) (84443-TSH) TLB-Sedimentation Rate (ESR) (85652-ESR)  Complete Medication List: 1)  Singulair 10 Mg Tabs (Montelukast sodium) .... Once daily 2)  Mucinex Dm 30-600 Mg Tb12 (Dextromethorphan-guaifenesin) .... Two times a day as needed 3)  Zyrtec Allergy 10 Mg Tabs (Cetirizine hcl) .... Once daily 4)  Maxzide-25 37.5-25 Mg Tabs (Triamterene-hctz) .... Once daily 5)  Tricor 145 Mg Tabs (Fenofibrate) .... Once daily 6)  Valium 5 Mg Tabs (Diazepam) .... Two times a day as needed 7)  Astelin 137 Mcg/spray Soln (Azelastine hcl) .... 2spray each nostril once daily 8)  Pristiq 100 Mg Xr24h-tab (Desvenlafaxine succinate) .... One by mouth daily 9)  Proctocort 30 Mg Supp (Hydrocortisone acetate) .Marland Kitchen.. 1 two times a day as needed hemorrhoidal painr 10)  Probiotic Caps (Misc intestinal flora regulat) .Marland Kitchen.. 1 once daily 11)  Hydromet 5-1.5 Mg/65ml Syrp (Hydrocodone-homatropine) .... Two tsp by mouth q 6 hours  Patient Instructions: 1)  one glass of soy mild a day and biotin 600 two times a day 2)  oct for cpx

## 2010-02-16 NOTE — Progress Notes (Signed)
Summary: virus  Phone Note Call from Patient   Caller: Patient Call For: Stacie Glaze MD Summary of Call: Complaining of sore throat , cough, head congestion and chest congestion.  No fever. Taking Tussionex, but wants to know what else they can take? [phone] Initial call taken by: Lynann Beaver CMA,  March 23, 2009 10:07 AM  Follow-up for Phone Call        ov given for today Follow-up by: Willy Eddy, LPN,  March 23, 2009 11:28 AM

## 2010-02-16 NOTE — Progress Notes (Signed)
  Phone Note Call from Patient Call back at Home Phone [phone]   Caller: Patient Call For: Stacie Glaze MD Summary of Call: Pt. told Dr. Lovell Sheehan yesterday the wrong amount of Ca that she is taking..  She is on 1200 mg daily, and wants to know whether to continue?  Initial call taken by: Lynann Beaver CMA AAMA,  November 25, 2009 12:44 PM  Follow-up for Phone Call        yes continue that Follow-up by: Willy Eddy, LPN,  November 25, 2009 1:18 PM  Additional Follow-up for Phone Call Additional follow up Details #1::        Notified pt. Additional Follow-up by: Lynann Beaver CMA AAMA,  November 25, 2009 2:21 PM

## 2010-02-16 NOTE — Procedures (Signed)
Summary: [facility]  [facility]   Imported By: Maryln Gottron 02/16/2009 11:26:09  _____________________________________________________________________  External Attachment:    Type:   Image     Comment:   External Document

## 2010-02-16 NOTE — Miscellaneous (Signed)
Summary: BONE DENSITY  Clinical Lists Changes  Orders: Added new Test order of T-Bone Densitometry (77080) - Signed Added new Test order of T-Lumbar Vertebral Assessment (77082) - Signed 

## 2010-02-18 NOTE — Assessment & Plan Note (Signed)
Summary: fu on med/njr [facility] [place] PER DOC/NJR   Vital Signs:  Patient profile:   60 year old female Height:      66 inches Weight:      165 pounds BMI:     26.73 Temp:     98.2 degrees F oral Pulse rate:   72 / minute Resp:     14 per minute BP sitting:   130 / 76  (left arm)  Vitals Entered By: Willy Eddy, LPN (January 27, 2010 2:03 PM) CC: roa- discuss welbutrin and repeat colonoscopy due this January , Lipid Management, Depressive symptoms Is Patient Diabetic? No   Primary Care Provider:  Stacie Glaze MD  CC:  roa- discuss welbutrin and repeat colonoscopy due this January , Lipid Management, and Depressive symptoms.  History of Present Illness: The mood control is not as good as the pristiq but cost of the medication was too high   Depressive Symptoms      This is a 60 year old woman who presents with Depressive symptoms.  The patient reports depressed mood, but denies loss of interest/pleasure, significant weight loss, significant weight gain, insomnia, hypersomnia, psychomotor agitation, and psychomotor retardation.  The patient also reports fatigue or loss of energy and indecisiveness.  The patient denies feelings of worthlessness, diminished concentration, thoughts of death, thoughts of suicide, suicidal intent, and suicidal plans.  The patient reports the following manic symptoms: abnormally irritable mood and distractibility.    Lipid Management History:      Positive NCEP/ATP III risk factors include female age 63 years old or older, early menopause without estrogen hormone replacement, and family history for ischemic heart disease (males less than 28 years old).  Negative NCEP/ATP III risk factors include non-diabetic, HDL cholesterol greater than 60, non-tobacco-user status, non-hypertensive, no ASHD (atherosclerotic heart disease), no prior stroke/TIA, no peripheral vascular disease, and no history of aortic aneurysm.     Preventive Screening-Counseling &  Management  Alcohol-Tobacco     Smoking Status: never     Passive Smoke Exposure: no     Tobacco Counseling: not indicated; no tobacco use  Problems Prior to Update: 1)  Malaise and Fatigue  (ICD-780.79) 2)  Alopecia  (ICD-704.00) 3)  Wheezing  (ICD-786.07) 4)  Acute Bronchiolitis Due Oth Infectious Organisms  (ICD-466.19) 5)  Preventive Health Care  (ICD-V70.0) 6)  Irritable Bowel Syndrome  (ICD-564.1) 7)  Hiatal Hernia With Reflux  (ICD-553.3) 8)  Internal Thrombosed Hemorrhoids  (ICD-455.1) 9)  Uti  (ICD-599.0) 10)  Wrist Pain, Right, Chronic  (ICD-719.43) 11)  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) 12)  Advef, Drug/medicinal/biological Subst Nos  (ICD-995.20) 13)  Hyperlipidemia  (ICD-272.4)  Medications Prior to Update: 1)  Singulair 10 Mg  Tabs (Montelukast Sodium) .... Once Daily 2)  Mucinex Dm 30-600 Mg  Tb12 (Dextromethorphan-Guaifenesin) .... Two Times A Day As Needed 3)  Zyrtec Allergy 10 Mg  Tabs (Cetirizine Hcl) .... Once Daily 4)  Maxzide-25 37.5-25 Mg  Tabs (Triamterene-Hctz) .... Once Daily 5)  Tricor 145 Mg  Tabs (Fenofibrate) .... Once Daily 6)  Valium 5 Mg  Tabs (Diazepam) .... Two Times A Day As Needed 7)  Astelin 137 Mcg/spray  Soln (Azelastine Hcl) .... 2spray Each Nostril Once Daily As Needed 8)  Proctocort 30 Mg Supp (Hydrocortisone Acetate) .Marland Kitchen.. 1 Two Times A Day As Needed Hemorrhoidal Painr 9)  Probiotic  Caps (Misc Intestinal Flora Regulat) .Marland Kitchen.. 1 Once Daily 10)  Calcium 1200 1200-1000 Mg-Unit Chew (Calcium Carbonate-Vit D-Min) .... One  By Mouth Daily 11)  Meclizine Hcl 25 Mg Tabs (Meclizine Hcl) .... One By Mouth Three Times A Day As Needed Dizzyness  Current Medications (verified): 1)  Singulair 10 Mg  Tabs (Montelukast Sodium) .... Once Daily 2)  Zyrtec Allergy 10 Mg  Tabs (Cetirizine Hcl) .... Once Daily 3)  Maxzide-25 37.5-25 Mg  Tabs (Triamterene-Hctz) .... Once Daily 4)  Tricor 145 Mg  Tabs (Fenofibrate) .... Once Daily 5)  Valium 5 Mg  Tabs  (Diazepam) .... Two Times A Day As Needed 6)  Calcium 1200 1200-1000 Mg-Unit Chew (Calcium Carbonate-Vit D-Min) .... One By Mouth Daily 7)  Wellbutrin Xl 150 Mg Xr24h-Tab (Bupropion Hcl) .... One By Mouth Daily ( She Has Branded Copay Card) 8)  Miralax  Powd (Polyethylene Glycol 3350) .... One Scoop Twice A Week Prn 9)  Biotin 1000 Mcg Tabs (Biotin) .... One By Mouth Two Times A Day  Allergies (verified): 1)  ! Sulfa 2)  ! Erythromycin  Past History:  Family History: Last updated: 07/17/2006 Family History of Cardiovascular disorder  Social History: Last updated: 07/17/2006 Married Never Smoked Alcohol use-no  Risk Factors: Caffeine Use: 1 (11/03/2006)  Risk Factors: Smoking Status: never (01/27/2010) Passive Smoke Exposure: no (01/27/2010)  Past medical, surgical, family and social histories (including risk factors) reviewed, and no changes noted (except as noted below).  Past Medical History: Reviewed history from 11/03/2006 and no changes required. Menopause Meniere's dz Hyperlipidemia  Family History: Reviewed history from 07/17/2006 and no changes required. Family History of Cardiovascular disorder  Social History: Reviewed history from 07/17/2006 and no changes required. Married Never Smoked Alcohol use-no  Review of Systems  The patient denies anorexia, fever, weight loss, weight gain, vision loss, decreased hearing, hoarseness, chest pain, syncope, dyspnea on exertion, peripheral edema, prolonged cough, headaches, hemoptysis, abdominal pain, melena, hematochezia, severe indigestion/heartburn, hematuria, incontinence, genital sores, muscle weakness, suspicious skin lesions, transient blindness, difficulty walking, depression, unusual weight change, abnormal bleeding, enlarged lymph nodes, angioedema, and breast masses.    Physical Exam  General:  Well-developed,well-nourished,in no acute distress; alert,appropriate and cooperative throughout  examination Head:  normocephalic and no abnormalities palpated.   Eyes:  pupils equal and pupils round.   Ears:  R ear normal and L ear normal.   Nose:  mucosal erythema, mucosal edema, and airflow obstruction.   Mouth:  posterior lymphoid hypertrophy, postnasal drip, and pharyngeal crowding.   Neck:  No deformities, masses, or tenderness noted. Lungs:  normal respiratory effort, R wheezes, and L wheezes.   Heart:  normal rate and regular rhythm.   Abdomen:  soft and non-tender.   Neurologic:  alert & oriented X3 and gait normal.   Psych:  flat affect, subdued, and withdrawn.     Impression & Recommendations:  Problem # 1:  MALAISE AND FATIGUE (ICD-780.79) secondary to number 2  Problem # 2:  DYSTHYMIC DISORDER (ICD-300.4) change to welbutrin  Complete Medication List: 1)  Singulair 10 Mg Tabs (Montelukast sodium) .... Once daily 2)  Zyrtec Allergy 10 Mg Tabs (Cetirizine hcl) .... Once daily 3)  Maxzide-25 37.5-25 Mg Tabs (Triamterene-hctz) .... Once daily 4)  Tricor 145 Mg Tabs (Fenofibrate) .... Once daily 5)  Valium 5 Mg Tabs (Diazepam) .... Two times a day as needed 6)  Calcium 1200 1200-1000 Mg-unit Chew (Calcium carbonate-vit d-min) .... One by mouth daily 7)  Bupropion Hcl 150 Mg Xr24h-tab (Bupropion hcl) .Marland Kitchen.. 1 once daily 8)  Miralax Powd (Polyethylene glycol 3350) .... One scoop twice a week prn 9)  Biotin 1000 Mcg Tabs (Biotin) .... One by mouth two times a day  Lipid Assessment/Plan:      Based on NCEP/ATP III, the patient's risk factor category is "2 or more risk factors and a calculated 10 year CAD risk of > 20%".  The patient's lipid goals are as follows: Total cholesterol goal is 200; LDL cholesterol goal is 130; HDL cholesterol goal is 40; Triglyceride goal is 150.  Her LDL cholesterol goal has been met.    Patient Instructions: 1)  Please keep april appointment Prescriptions: WELLBUTRIN XL 150 MG XR24H-TAB (BUPROPION HCL) one by mouth daily ( she has branded  copay card)  #30 x 11   Entered and Authorized by:   Stacie Glaze MD   Signed by:   Stacie Glaze MD on 01/27/2010   Method used:   Electronically to        [facility]  [phone]* (retail)       [address] [place], [place]  [address]       Ph: [phone] or [phone]       Fax: [phone]   RxID:   [id number]    Orders Added: 1)  Est. Patient Level III [32202]

## 2010-02-18 NOTE — Progress Notes (Signed)
Summary: Pt req refill of generic Valium to [facility]  Phone Note Refill Request Call back at [facility] Phone [phone]   Refills Requested: Medication #1:  VALIUM 5 MG  TABS two times a day as needed   Dosage confirmed as above?Dosage Confirmed   Supply Requested: 3 months Pls send this to [facility] order.    Method Requested: Telephone to [facility] Initial call taken by: Lucy Antigua,  February 05, 2010 2:45 PM    Prescriptions: VALIUM 5 MG  TABS (DIAZEPAM) two times a day as needed  #180 x 1   Entered by:   Willy Eddy, LPN   Authorized by:   Stacie Glaze MD   Signed by:   Willy Eddy, LPN on 09/81/1914   Method used:   Printed then faxed to ...       [facility]  [phone]* (retail)       [address] [place], [place]  [address]       Ph: [phone] or [phone]       Fax: [phone]   RxID:   [id number]   Appended Document: Pt req refill of generic Valium [facility]

## 2010-02-18 NOTE — Progress Notes (Signed)
Summary: please advise of generics  Phone Note Call from Patient Call back at Home Phone [phone]   Caller: Patient---triage vm Summary of Call: Wellbutirn XL 150mg , at the brand name. Even with the discount card, she would still owe around $130.00 per month. She is requesting the generic to be sent to [facility] @ [phone].  [phone]. she will need something to take until mail order arrives......samples or rx. please return her call. Initial call taken by: Warnell Forester,  January 28, 2010 9:39 AM  Follow-up for Phone Call        may send in generic 150 mg 90 day to mail order Follow-up by: Stacie Glaze MD,  January 28, 2010 1:11 PM    New/Updated Medications: BUPROPION HCL 150 MG XR24H-TAB (BUPROPION HCL) 1 once daily Prescriptions: BUPROPION HCL 150 MG XR24H-TAB (BUPROPION HCL) 1 once daily  #90 x 3   Entered by:   Willy Eddy, LPN   Authorized by:   Stacie Glaze MD   Signed by:   Willy Eddy, LPN on 02/72/5366   Method used:   Electronically to        [facility]* (retail)             ,          Ph: [phone]       Fax: [phone]   RxID:   [id number]   Appended Document: please advise of generics Pt wants to know what to take until the mail order comes in, Dr. Lovell Sheehan.   You were going to think about this.........Marland Kitchen

## 2010-04-28 ENCOUNTER — Ambulatory Visit: Payer: Self-pay | Admitting: Internal Medicine

## 2010-04-30 ENCOUNTER — Encounter: Payer: Self-pay | Admitting: Internal Medicine

## 2010-05-11 ENCOUNTER — Encounter: Payer: Self-pay | Admitting: Internal Medicine

## 2010-05-11 ENCOUNTER — Ambulatory Visit (INDEPENDENT_AMBULATORY_CARE_PROVIDER_SITE_OTHER): Payer: PRIVATE HEALTH INSURANCE | Admitting: Internal Medicine

## 2010-05-11 VITALS — BP 120/70 | HR 72 | Temp 98.2°F | Resp 14 | Ht 62.0 in | Wt 161.0 lb

## 2010-05-11 DIAGNOSIS — K219 Gastro-esophageal reflux disease without esophagitis: Secondary | ICD-10-CM

## 2010-05-11 DIAGNOSIS — R0789 Other chest pain: Secondary | ICD-10-CM

## 2010-05-11 MED ORDER — ESOMEPRAZOLE MAGNESIUM 40 MG PO CPDR: 40.0000 mg | DELAYED_RELEASE_CAPSULE | Freq: Two times a day (BID) | ORAL | Status: DC

## 2010-05-11 NOTE — Progress Notes (Signed)
  Subjective:    Patient ID: Kristin Bonilla, female    DOB: March 01, 1950, 60 y.o.   MRN: [id number]  HPI is a 60 year old white female who presents with a copy of her recent [facility] she had a polyp at the cecum which was considered benign with any evidence of cancer she will have a colonoscopy in 2 years.  Other issues we are covering today include her dysthymic disorder for whichs he is taking bupropion, history of irritable bowel syndrome status post recent colonoscopy and a new complaint of she has a history of hiatal hernia with reflux and now presents with a severe episode of acid reflux resulting in esophagitis has not been responsive to the ranitidine.  I I recommend that she be  put on a proton pump inhibitor for a period of time for complete esophageal healing   Review of Systems  Constitutional: Negative for activity change, appetite change and fatigue.  HENT: Negative for ear pain, congestion, neck pain, postnasal drip and sinus pressure.   Eyes: Negative for redness and visual disturbance.  Respiratory: Negative for cough, shortness of breath and wheezing.   Gastrointestinal: Negative for abdominal pain and abdominal distention.  Genitourinary: Negative for dysuria, frequency and menstrual problem.  Musculoskeletal: Negative for myalgias, joint swelling and arthralgias.  Skin: Negative for rash and wound.  Neurological: Negative for dizziness, weakness and headaches.  Hematological: Negative for adenopathy. Does not bruise/bleed easily.  Psychiatric/Behavioral: Negative for sleep disturbance and decreased concentration.       Objective:   Physical Exam  Nursing note and vitals reviewed. Constitutional: She is oriented to person, place, and time. She appears well-developed and well-nourished. No distress.  HENT:  Head: Normocephalic and atraumatic.  Right Ear: External ear normal.  Left Ear: External ear normal.  Nose: Nose normal.    Mouth/Throat: Oropharynx is clear and moist.  Eyes: Conjunctivae and EOM are normal. Pupils are equal, round, and reactive to light.  Neck: Normal range of motion. Neck supple. No JVD present. No tracheal deviation present. No thyromegaly present.  Cardiovascular: Normal rate, regular rhythm, normal heart sounds and intact distal pulses.   No murmur heard. Pulmonary/Chest: Effort normal and breath sounds normal. She has no wheezes. She exhibits no tenderness.  Abdominal: Soft. Bowel sounds are normal.  Musculoskeletal: Normal range of motion. She exhibits no edema and no tenderness.  Lymphadenopathy:    She has no cervical adenopathy.  Neurological: She is alert and oriented to person, place, and time. She has normal reflexes. No cranial nerve deficit.  Skin: Skin is warm and dry. She is not diaphoretic.  Psychiatric: She has a normal mood and affect. Her behavior is normal.          Assessment & Plan:  Erosive esophagitis we will begin a proton pump inhibitor I have samples of Nexium we'll put her on Nexium 40 mg twice daily for a minimum of 6 weeks and then she can resume her ranitidine on a nightly basis I believe since she has a hiatal hernia and this is recurrent reflux she should be on ranitidine chronically. Her colonoscopy results were reviewed we agree with colonoscopy in 2 years Her dysthymic symptoms are stable on the generic Wellbutrin

## 2010-05-14 ENCOUNTER — Telehealth: Payer: Self-pay | Admitting: Internal Medicine

## 2010-05-14 NOTE — Telephone Encounter (Signed)
Pt called and said that Dr Lovell Sheehan gave pt Nexium samples to try. Pt thought that Dr Lovell Sheehan told her to take this med twice a day for 6 wks, but pt appt is not until 07/12/10. Pt wants to know when she should start taking the Ranitidine? Pls call to clarify instructions from doctor.

## 2010-05-14 NOTE — Telephone Encounter (Signed)
As instructed  6weeks and then start ranitidine- and at ov will evaluate-pt informed

## 2010-05-25 ENCOUNTER — Ambulatory Visit: Payer: Self-pay | Admitting: Internal Medicine

## 2010-06-20 ENCOUNTER — Other Ambulatory Visit: Payer: Self-pay | Admitting: Internal Medicine

## 2010-07-09 ENCOUNTER — Other Ambulatory Visit: Payer: Self-pay | Admitting: Internal Medicine

## 2010-07-12 ENCOUNTER — Encounter: Payer: Self-pay | Admitting: Internal Medicine

## 2010-07-12 ENCOUNTER — Ambulatory Visit (INDEPENDENT_AMBULATORY_CARE_PROVIDER_SITE_OTHER): Payer: PRIVATE HEALTH INSURANCE | Admitting: Internal Medicine

## 2010-07-12 VITALS — BP 124/80 | HR 72 | Temp 98.2°F | Resp 16 | Ht 62.0 in | Wt 159.0 lb

## 2010-07-12 DIAGNOSIS — R5381 Other malaise: Secondary | ICD-10-CM

## 2010-07-12 DIAGNOSIS — K219 Gastro-esophageal reflux disease without esophagitis: Secondary | ICD-10-CM

## 2010-07-12 DIAGNOSIS — E785 Hyperlipidemia, unspecified: Secondary | ICD-10-CM

## 2010-07-12 DIAGNOSIS — K589 Irritable bowel syndrome without diarrhea: Secondary | ICD-10-CM

## 2010-07-12 DIAGNOSIS — R5383 Other fatigue: Secondary | ICD-10-CM

## 2010-07-12 MED ORDER — RANITIDINE HCL 150 MG PO TABS: 150.0000 mg | ORAL_TABLET | Freq: Two times a day (BID) | ORAL | Status: DC

## 2010-07-12 MED ORDER — DESVENLAFAXINE SUCCINATE ER 50 MG PO TB24: 50.0000 mg | ORAL_TABLET | Freq: Every day | ORAL | Status: DC

## 2010-07-12 MED ORDER — FENOFIBRATE 160 MG PO TABS: 160.0000 mg | ORAL_TABLET | Freq: Every day | ORAL | Status: DC

## 2010-07-12 NOTE — Progress Notes (Signed)
  Subjective:    Patient ID: Kristin Bonilla, female    DOB: May 13, 1950, 60 y.o.   MRN: [id number]  HPI  The pristiq worked better but she is doing "ok" weight stable Has increased stressors asthma stable Change in lipid therapy for cost  Review of Systems  Constitutional: Negative for activity change, appetite change and fatigue.  HENT: Negative for ear pain, congestion, neck pain, postnasal drip and sinus pressure.   Eyes: Negative for redness and visual disturbance.  Respiratory: Negative for cough, shortness of breath and wheezing.   Gastrointestinal: Negative for abdominal pain and abdominal distention.  Genitourinary: Negative for dysuria, frequency and menstrual problem.  Musculoskeletal: Negative for myalgias, joint swelling and arthralgias.  Skin: Negative for rash and wound.  Neurological: Negative for dizziness, weakness and headaches.  Hematological: Negative for adenopathy. Does not bruise/bleed easily.  Psychiatric/Behavioral: Negative for sleep disturbance and decreased concentration.   Past Medical History  Diagnosis Date  . Menopause   . Meniere disease   . Hyperlipidemia    History reviewed. No pertinent past surgical history.  reports that she has never smoked. She does not have any smokeless tobacco history on file. She reports that she does not drink alcohol or use illicit drugs. family history includes Heart disease in an unspecified family member. Allergies  Allergen Reactions  . Erythromycin   . Sulfonamide Derivatives        Objective:   Physical Exam  Constitutional: She is oriented to person, place, and time. She appears well-developed and well-nourished. No distress.  HENT:  Head: Normocephalic and atraumatic.  Right Ear: External ear normal.  Left Ear: External ear normal.  Nose: Nose normal.  Mouth/Throat: Oropharynx is clear and moist.  Eyes: Conjunctivae and EOM are normal. Pupils are equal, round, and reactive to light.  Neck: Normal  range of motion. Neck supple. No JVD present. No tracheal deviation present. No thyromegaly present.  Cardiovascular: Normal rate, regular rhythm, normal heart sounds and intact distal pulses.   No murmur heard. Pulmonary/Chest: Effort normal and breath sounds normal. She has no wheezes. She exhibits no tenderness.  Abdominal: Soft. Bowel sounds are normal.  Musculoskeletal: Normal range of motion. She exhibits no edema and no tenderness.  Lymphadenopathy:    She has no cervical adenopathy.  Neurological: She is alert and oriented to person, place, and time. She has normal reflexes. No cranial nerve deficit.  Skin: Skin is warm and dry. She is not diaphoretic.  Psychiatric: She has a normal mood and affect. Her behavior is normal.          Assessment & Plan:  Change back to pristiq Good reponse to the zantac increase to BID Add  Nioxin shampoo

## 2010-08-05 ENCOUNTER — Other Ambulatory Visit: Payer: Self-pay | Admitting: Internal Medicine

## 2010-08-05 MED ORDER — TRIAMTERENE-HCTZ 37.5-25 MG PO TABS: 1.0000 | ORAL_TABLET | Freq: Every day | ORAL | Status: DC

## 2010-08-13 ENCOUNTER — Other Ambulatory Visit: Payer: Self-pay | Admitting: *Deleted

## 2010-08-13 MED ORDER — DIAZEPAM 5 MG PO TABS: 5.0000 mg | ORAL_TABLET | Freq: Two times a day (BID) | ORAL | Status: DC | PRN

## 2010-09-27 ENCOUNTER — Telehealth: Payer: Self-pay | Admitting: Internal Medicine

## 2010-09-27 NOTE — Telephone Encounter (Signed)
Pt has ov sch for nov 2012. Pt is requesting more samples of pristiq

## 2010-09-28 NOTE — Telephone Encounter (Signed)
Samples up front ready for p/u, pt aware 

## 2010-10-29 ENCOUNTER — Other Ambulatory Visit: Payer: Self-pay | Admitting: *Deleted

## 2010-10-29 ENCOUNTER — Telehealth: Payer: Self-pay | Admitting: *Deleted

## 2010-10-29 MED ORDER — TRIAMTERENE-HCTZ 37.5-25 MG PO TABS: 1.0000 | ORAL_TABLET | Freq: Every day | ORAL | Status: DC

## 2010-10-29 NOTE — Telephone Encounter (Signed)
Done

## 2010-11-19 ENCOUNTER — Other Ambulatory Visit (INDEPENDENT_AMBULATORY_CARE_PROVIDER_SITE_OTHER): Payer: BC Managed Care – PPO

## 2010-11-19 DIAGNOSIS — Z0000 Encounter for general adult medical examination without abnormal findings: Secondary | ICD-10-CM

## 2010-11-19 LAB — CBC WITH DIFFERENTIAL/PLATELET
Basophils Absolute: 0.1 10*3/uL (ref 0.0–0.1)
Eosinophils Absolute: 0.2 10*3/uL (ref 0.0–0.7)
Lymphocytes Relative: 39.8 % (ref 12.0–46.0)
MCHC: 33.9 g/dL (ref 30.0–36.0)
Neutrophils Relative %: 40.7 % — ABNORMAL LOW (ref 43.0–77.0)
Platelets: 262 10*3/uL (ref 150.0–400.0)
RDW: 14.1 % (ref 11.5–14.6)

## 2010-11-19 LAB — HEPATIC FUNCTION PANEL
Albumin: 4.5 g/dL (ref 3.5–5.2)
Alkaline Phosphatase: 51 U/L (ref 39–117)
Bilirubin, Direct: 0.1 mg/dL (ref 0.0–0.3)

## 2010-11-19 LAB — BASIC METABOLIC PANEL
CO2: 29 mEq/L (ref 19–32)
Calcium: 10.2 mg/dL (ref 8.4–10.5)
Creatinine, Ser: 1.4 mg/dL — ABNORMAL HIGH (ref 0.4–1.2)
Glucose, Bld: 96 mg/dL (ref 70–99)

## 2010-11-19 LAB — LIPID PANEL
HDL: 69.5 mg/dL (ref >39.00)
Total CHOL/HDL Ratio: 3
VLDL: 16 mg/dL (ref 0.0–40.0)

## 2010-11-19 LAB — TSH: TSH: 3.03 u[IU]/mL (ref 0.35–5.50)

## 2010-11-19 NOTE — Progress Notes (Signed)
Addended by: Bonnye Fava on: 11/19/2010 08:39 AM   Modules accepted: Orders

## 2010-11-26 ENCOUNTER — Encounter: Payer: Self-pay | Admitting: Internal Medicine

## 2010-11-26 ENCOUNTER — Ambulatory Visit (INDEPENDENT_AMBULATORY_CARE_PROVIDER_SITE_OTHER): Payer: BC Managed Care – PPO | Admitting: Internal Medicine

## 2010-11-26 ENCOUNTER — Ambulatory Visit (INDEPENDENT_AMBULATORY_CARE_PROVIDER_SITE_OTHER)
Admission: RE | Admit: 2010-11-26 | Discharge: 2010-11-26 | Disposition: A | Discharge status: Home / Self Care | Payer: BC Managed Care – PPO | Source: Ambulatory Visit | Attending: Internal Medicine | Admitting: Internal Medicine

## 2010-11-26 VITALS — BP 124/80 | HR 72 | Temp 98.2°F | Resp 16 | Ht 62.5 in | Wt 162.0 lb

## 2010-11-26 DIAGNOSIS — T887XXA Unspecified adverse effect of drug or medicament, initial encounter: Secondary | ICD-10-CM

## 2010-11-26 DIAGNOSIS — M169 Osteoarthritis of hip, unspecified: Secondary | ICD-10-CM

## 2010-11-26 DIAGNOSIS — M1610 Unilateral primary osteoarthritis, unspecified hip: Secondary | ICD-10-CM

## 2010-11-26 DIAGNOSIS — M7150 Other bursitis, not elsewhere classified, unspecified site: Secondary | ICD-10-CM

## 2010-11-26 DIAGNOSIS — N289 Disorder of kidney and ureter, unspecified: Secondary | ICD-10-CM

## 2010-11-26 DIAGNOSIS — Z23 Encounter for immunization: Secondary | ICD-10-CM

## 2010-11-26 DIAGNOSIS — N390 Urinary tract infection, site not specified: Secondary | ICD-10-CM

## 2010-11-26 DIAGNOSIS — Z0000 Encounter for general adult medical examination without abnormal findings: Secondary | ICD-10-CM

## 2010-11-26 DIAGNOSIS — Z136 Encounter for screening for cardiovascular disorders: Secondary | ICD-10-CM

## 2010-11-26 LAB — BASIC METABOLIC PANEL
CO2: 30 mEq/L (ref 19–32)
Calcium: 10.1 mg/dL (ref 8.4–10.5)
Creatinine, Ser: 1.2 mg/dL (ref 0.4–1.2)

## 2010-11-26 MED ORDER — METHYLPREDNISOLONE ACETATE 40 MG/ML IJ SUSP: 40.0000 mg | Freq: Once | INTRAMUSCULAR | Status: DC

## 2010-11-26 MED ORDER — CIPROFLOXACIN HCL 250 MG PO TABS: 250.0000 mg | ORAL_TABLET | Freq: Two times a day (BID) | ORAL | Status: AC

## 2010-11-26 NOTE — Patient Instructions (Signed)
The patient is instructed to continue all medications as prescribed. Schedule followup with check out clerk upon leaving the clinic  

## 2010-11-26 NOTE — Progress Notes (Signed)
Subjective:    Patient ID: Kristin Bonilla, female    DOB: 01-24-1950, 60 y.o.   MRN: [id number]  HPI  CPX She is also followed for hyperlipidemia mild depression hiatal hernia with reflux . Reviewed all her medications and past history  uti symtoms currently she has some mild dysuria increased frequency without back pain or fever Renal insuffliciency Bursitis in left hip... Taking aleve. The patient has been using Naprosyn for bursitis in her hip she states that her pain began after a fall  Review of Systems  Constitutional: Negative for activity change, appetite change and fatigue.  HENT: Negative for ear pain, congestion, neck pain, postnasal drip and sinus pressure.   Eyes: Negative for redness and visual disturbance.  Respiratory: Negative for cough, shortness of breath and wheezing.   Gastrointestinal: Negative for abdominal pain and abdominal distention.  Genitourinary: Negative for dysuria, frequency and menstrual problem.  Musculoskeletal: Negative for myalgias, joint swelling and arthralgias.  Skin: Negative for rash and wound.  Neurological: Negative for dizziness, weakness and headaches.  Hematological: Negative for adenopathy. Does not bruise/bleed easily.  Psychiatric/Behavioral: Negative for sleep disturbance and decreased concentration.   Past Medical History  Diagnosis Date  . Menopause   . Meniere disease   . Hyperlipidemia    No past surgical history on file.  reports that she has never smoked. She does not have any smokeless tobacco history on file. She reports that she does not drink alcohol or use illicit drugs. family history includes Heart disease in an unspecified family member. Allergies  Allergen Reactions  . Erythromycin   . Sulfonamide Derivatives         Objective:   Physical Exam  Vitals reviewed. Constitutional: She is oriented to person, place, and time. She appears well-developed and well-nourished. No distress.  HENT:  Head:  Normocephalic and atraumatic.  Right Ear: External ear normal.  Left Ear: External ear normal.  Nose: Nose normal.  Mouth/Throat: Oropharynx is clear and moist.  Eyes: Conjunctivae and EOM are normal. Pupils are equal, round, and reactive to light.  Neck: Normal range of motion. Neck supple. No JVD present. No tracheal deviation present. No thyromegaly present.  Cardiovascular: Normal rate, regular rhythm, normal heart sounds and intact distal pulses.   No murmur heard. Pulmonary/Chest: Effort normal and breath sounds normal. She has no wheezes. She exhibits no tenderness.  Abdominal: Soft. Bowel sounds are normal.  Musculoskeletal: Normal range of motion. She exhibits no edema and no tenderness.  Lymphadenopathy:    She has no cervical adenopathy.  Neurological: She is alert and oriented to person, place, and time. She has normal reflexes. No cranial nerve deficit.  Skin: Skin is warm and dry. She is not diaphoretic.  Psychiatric: She has a normal mood and affect. Her behavior is normal.          Assessment & Plan:   This is a routine physical examination for this healthy  Female. Reviewed all health maintenance protocols including mammography colonoscopy bone density and reviewed appropriate screening labs. Her immunization history was reviewed as well as her current medications and allergies refills of her chronic medications were given and the plan for yearly health maintenance was discussed all orders and referrals were made as appropriate.  Acute renal insufficiency not seen before this could be due to the high dehydration this could also be due to the excessive use of nonsteroidals for her hip pain.  Traumatic bursitis  Today her hip was prepped and a Depo-Medrol injection  was placed into the hip joint x-rays of the hip will followup if pain persists she is instructed not to take nonsteroidals and renal function will be drawn today to monitor for progression of acute renal  insufficiency. Ultrasound the abdomen would be a next evaluate chronic renal insufficiency

## 2010-11-30 ENCOUNTER — Telehealth: Payer: Self-pay | Admitting: *Deleted

## 2010-11-30 NOTE — Telephone Encounter (Signed)
Please give pt her lab results.

## 2010-11-30 NOTE — Telephone Encounter (Signed)
Ok to  call, and review lab work

## 2010-12-06 NOTE — Telephone Encounter (Signed)
Patient is aware of lab results but is worried about her GFR.  She does complain of some body aches.  She has been drinking more fluids. Any suggestions?

## 2010-12-07 NOTE — Telephone Encounter (Signed)
Pt came to office and was given labs results with explanation

## 2011-01-27 ENCOUNTER — Encounter: Payer: Self-pay | Admitting: Internal Medicine

## 2011-01-27 ENCOUNTER — Ambulatory Visit (INDEPENDENT_AMBULATORY_CARE_PROVIDER_SITE_OTHER): Payer: BC Managed Care – PPO | Admitting: Internal Medicine

## 2011-01-27 VITALS — BP 124/74 | HR 72 | Temp 98.2°F | Resp 16 | Ht 62.0 in | Wt 162.0 lb

## 2011-01-27 DIAGNOSIS — N289 Disorder of kidney and ureter, unspecified: Secondary | ICD-10-CM

## 2011-01-27 DIAGNOSIS — M549 Dorsalgia, unspecified: Secondary | ICD-10-CM

## 2011-01-27 DIAGNOSIS — I10 Essential (primary) hypertension: Secondary | ICD-10-CM

## 2011-01-27 MED ORDER — DIAZEPAM 5 MG PO TABS: 5.0000 mg | ORAL_TABLET | Freq: Two times a day (BID) | ORAL | Status: DC | PRN

## 2011-01-27 MED ORDER — BUPROPION HCL ER (XL) 150 MG PO TB24: 150.0000 mg | ORAL_TABLET | Freq: Every day | ORAL | Status: DC

## 2011-01-27 MED ORDER — CELECOXIB 200 MG PO CAPS: 200.0000 mg | ORAL_CAPSULE | Freq: Two times a day (BID) | ORAL | Status: AC

## 2011-01-27 MED ORDER — TRIAMTERENE-HCTZ 37.5-25 MG PO TABS: 0.5000 | ORAL_TABLET | Freq: Every day | ORAL | Status: DC

## 2011-01-27 NOTE — Progress Notes (Signed)
Subjective:    Patient ID: Kristin Bonilla, female    DOB: 1950-03-07, 61 y.o.   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  We discussed these findings with the patient in detail patient has a history of irritable bowel syndrome and hiatal hernia with reflux on chronic proton pump inhibitor would also related this to risk for osteoporosis    Review of Systems  Constitutional: Negative for activity change, appetite change and fatigue.  HENT: Negative for ear pain, congestion, neck pain, postnasal drip and sinus pressure.   Eyes: Negative for redness and visual disturbance.  Respiratory: Negative for cough, shortness of breath and wheezing.   Gastrointestinal: Negative for abdominal pain and abdominal distention.  Genitourinary: Negative for dysuria, frequency and menstrual problem.  Musculoskeletal: Negative for myalgias, joint swelling and arthralgias.  Skin: Negative for rash and wound.  Neurological: Negative for dizziness, weakness and headaches.  Hematological: Negative for adenopathy. Does not bruise/bleed easily.  Psychiatric/Behavioral: Negative for sleep disturbance and decreased concentration.   Past Medical History  Diagnosis Date  . Menopause   . Meniere disease   . Hyperlipidemia     History   Social History  . Marital Status: Married    Spouse Name: N/A    Number of Children: N/A  . Years of Education: N/A   Occupational History  . Not on file.   Social History Main Topics  . Smoking status: Never Smoker   . Smokeless tobacco: Not on file  . Alcohol Use: No  . Drug Use: No  . Sexually Active: Yes   Other Topics Concern  . Not on file   Social History Narrative  . No narrative on file    No past surgical history on file.  Family History  Problem  Relation Age of Onset  . Heart disease      Allergies  Allergen Reactions  . Erythromycin   . Sulfonamide Derivatives     Current Outpatient Prescriptions on File Prior to Visit  Medication Sig Dispense Refill  . Biotin 1000 MCG tablet Take 1,000 mcg by mouth 2 (two) times daily.        . Calcium Carbonate-Vit D-Min (CALCIUM 1200 PO) Take by mouth daily.        . cetirizine (ZYRTEC) 10 MG tablet Take 10 mg by mouth daily.        . fenofibrate (TRICOR) 160 MG tablet Take 1 tablet (160 mg total) by mouth daily.  90 tablet  2  . polyethylene glycol powder (MIRALAX) powder Take 17 g by mouth 2 (two) times a week.        . ranitidine (ZANTAC) 150 MG tablet Take 1 tablet (150 mg total) by mouth 2 (two) times daily.  180 tablet  3   Current Facility-Administered Medications on File Prior to Visit  Medication Dose Route Frequency Provider Last Rate Last Dose  . methylPREDNISolone acetate (DEPO-MEDROL) injection 40 mg  40 mg Intra-articular Once Stacie Glaze, MD        BP 124/74  Pulse 72  Temp 98.2 F (36.8 C)  Resp 16  Ht 5\' 2"  (1.575 m)  Wt 162 lb (73.483 kg)  BMI 29.63 kg/m2       Objective:   Physical Exam  Nursing note and vitals reviewed. Constitutional:  She is oriented to person, place, and time. She appears well-developed and well-nourished. No distress.  HENT:  Head: Normocephalic and atraumatic.  Right Ear: External ear normal.  Left Ear: External ear normal.  Nose: Nose normal.  Mouth/Throat: Oropharynx is clear and moist.  Eyes: Conjunctivae and EOM are normal. Pupils are equal, round, and reactive to light.  Neck: Normal range of motion. Neck supple. No JVD present. No tracheal deviation present. No thyromegaly present.  Cardiovascular: Normal rate, regular rhythm, normal heart sounds and intact distal pulses.   No murmur heard. Pulmonary/Chest: Effort normal and breath sounds normal. She has no wheezes. She exhibits no tenderness.  Abdominal: Soft. Bowel  sounds are normal.  Musculoskeletal: Normal range of motion. She exhibits no edema and no tenderness.  Lymphadenopathy:    She has no cervical adenopathy.  Neurological: She is alert and oriented to person, place, and time. She has normal reflexes. No cranial nerve deficit.  Skin: Skin is warm and dry. She is not diaphoretic.  Psychiatric: She has a normal mood and affect. Her behavior is normal.          Assessment & Plan:  We had a detailed discussion of radicular pain versus true hip pain as etiology of her hip discomfort and will review an x-ray of the LS-spine and hip was appropriate referral for either physical therapy or orthopedic. She is stable on her Wellbutrin.  She tolerates the fenofibrate for her hyperlipidemia and we discussed the use of Zantac rather than a PPI for GERD due to the increased risk for osteoporosis.  Her blood pressure is stable and she is on Maxide as a diuretic therapy both for hypertension and for Mnire's. There is an elevation of creatinine and BUN and maybe dehydration we recommended that she cut the Maxzide to one half tablet by mouth daily and we will monitor his basic metabolic panel in approximately 2 months

## 2011-01-27 NOTE — Patient Instructions (Signed)
Back Exercises Back exercises help treat and prevent back injuries. The goal of back exercises is to increase the strength of your abdominal and back muscles and the flexibility of your back. These exercises should be started when you no longer have back pain. Back exercises include:  Pelvic Tilt. Lie on your back with your knees bent. Tilt your pelvis until the lower part of your back is against the floor. Hold this position 5 to 10 sec and repeat 5 to 10 times.   Knee to Chest. Pull first 1 knee up against your chest and hold for 20 to 30 seconds, repeat this with the other knee, and then both knees. This may be done with the other leg straight or bent, whichever feels better.   Sit-Ups or Curl-Ups. Bend your knees 90 degrees. Start with tilting your pelvis, and do a partial, slow sit-up, lifting your trunk only 30 to 45 degrees off the floor. Take at least 2 to 3 seconds for each sit-up. Do not do sit-ups with your knees out straight. If partial sit-ups are difficult, simply do the above but with only tightening your abdominal muscles and holding it as directed.   Hip-Lift. Lie on your back with your knees flexed 90 degrees. Push down with your feet and shoulders as you raise your hips a couple inches off the floor; hold for 10 seconds, repeat 5 to 10 times.   Back arches. Lie on your stomach, propping yourself up on bent elbows. Slowly press on your hands, causing an arch in your low back. Repeat 3 to 5 times. Any initial stiffness and discomfort should lessen with repetition over time.   Shoulder-Lifts. Lie face down with arms beside your body. Keep hips and torso pressed to floor as you slowly lift your head and shoulders off the floor.  Do not overdo your exercises, especially in the beginning. Exercises may cause you some mild back discomfort which lasts for a few minutes; however, if the pain is more severe, or lasts for more than 15 minutes, do not continue exercises until you see your  caregiver. Improvement with exercise therapy for back problems is slow.  See your caregivers for assistance with developing a proper back exercise program. Document Released: 02/11/2004 Document Revised: 09/01/2010 Document Reviewed: 01/03/2005 [employer] Patient Information 2012 [employer]. 

## 2011-01-28 ENCOUNTER — Telehealth: Payer: Self-pay | Admitting: *Deleted

## 2011-01-28 NOTE — Telephone Encounter (Signed)
Pt went to get her Maxzide and they do not have the tablet anymore.   It comes in a capsule now.  Advised to take one q o day until Monday, and we will talk to Dr. Lovell Sheehan.

## 2011-02-08 ENCOUNTER — Other Ambulatory Visit: Payer: Self-pay | Admitting: *Deleted

## 2011-02-08 MED ORDER — DIAZEPAM 5 MG PO TABS: 5.0000 mg | ORAL_TABLET | Freq: Two times a day (BID) | ORAL | Status: DC | PRN

## 2011-02-23 ENCOUNTER — Other Ambulatory Visit: Payer: Self-pay | Admitting: Internal Medicine

## 2011-03-02 ENCOUNTER — Other Ambulatory Visit (INDEPENDENT_AMBULATORY_CARE_PROVIDER_SITE_OTHER): Payer: BC Managed Care – PPO

## 2011-03-02 DIAGNOSIS — N289 Disorder of kidney and ureter, unspecified: Secondary | ICD-10-CM

## 2011-03-02 LAB — BASIC METABOLIC PANEL
CO2: 28 mEq/L (ref 19–32)
Calcium: 10.4 mg/dL (ref 8.4–10.5)
Chloride: 105 mEq/L (ref 96–112)
Sodium: 143 mEq/L (ref 135–145)

## 2011-03-03 ENCOUNTER — Other Ambulatory Visit: Payer: BC Managed Care – PPO

## 2011-03-10 ENCOUNTER — Encounter: Payer: Self-pay | Admitting: Internal Medicine

## 2011-03-10 ENCOUNTER — Ambulatory Visit (INDEPENDENT_AMBULATORY_CARE_PROVIDER_SITE_OTHER): Payer: BC Managed Care – PPO | Admitting: Internal Medicine

## 2011-03-10 VITALS — BP 130/80 | HR 72 | Temp 98.3°F | Resp 16 | Ht 62.0 in | Wt 160.0 lb

## 2011-03-10 DIAGNOSIS — N289 Disorder of kidney and ureter, unspecified: Secondary | ICD-10-CM

## 2011-03-10 NOTE — Progress Notes (Signed)
Subjective:    Patient ID: Kristin Bonilla, female    DOB: Aug 27, 1950, 61 y.o.   MRN: [id number]  HPI Monitor for increased creatinine Stable blood pressure   Review of Systems  Constitutional: Negative for activity change, appetite change and fatigue.  HENT: Negative for ear pain, congestion, neck pain, postnasal drip and sinus pressure.   Eyes: Negative for redness and visual disturbance.  Respiratory: Negative for cough, shortness of breath and wheezing.   Gastrointestinal: Negative for abdominal pain and abdominal distention.  Genitourinary: Negative for dysuria, frequency and menstrual problem.  Musculoskeletal: Negative for myalgias, joint swelling and arthralgias.  Skin: Negative for rash and wound.  Neurological: Negative for dizziness, weakness and headaches.  Hematological: Negative for adenopathy. Does not bruise/bleed easily.  Psychiatric/Behavioral: Negative for sleep disturbance and decreased concentration.   Past Medical History  Diagnosis Date  . Menopause   . Meniere disease   . Hyperlipidemia     History   Social History  . Marital Status: Married    Spouse Name: N/A    Number of Children: N/A  . Years of Education: N/A   Occupational History  . Not on file.   Social History Main Topics  . Smoking status: Never Smoker   . Smokeless tobacco: Not on file  . Alcohol Use: No  . Drug Use: No  . Sexually Active: Yes   Other Topics Concern  . Not on file   Social History Narrative  . No narrative on file    No past surgical history on file.  Family History  Problem Relation Age of Onset  . Heart disease      Allergies  Allergen Reactions  . Erythromycin   . Sulfonamide Derivatives     Current Outpatient Prescriptions on File Prior to Visit  Medication Sig Dispense Refill  . Biotin 1000 MCG tablet Take 1,000 mcg by mouth 2 (two) times daily.        Marland Kitchen buPROPion (WELLBUTRIN XL) 150 MG 24 hr tablet Take 1 tablet (150 mg total) by mouth  daily.  90 tablet  3  . Calcium Carbonate-Vit D-Min (CALCIUM 1200 PO) Take by mouth daily.        . cetirizine (ZYRTEC) 10 MG tablet Take 10 mg by mouth daily.        . diazepam (VALIUM) 5 MG tablet Take 1 tablet (5 mg total) by mouth every 12 (twelve) hours as needed.  180 tablet  1  . fenofibrate (TRICOR) 160 MG tablet Take 1 tablet (160 mg total) by mouth daily.  90 tablet  2  . montelukast (SINGULAIR) 10 MG tablet TAKE 1 TABLET DAILY  90 tablet  1  . polyethylene glycol powder (MIRALAX) powder Take 17 g by mouth 2 (two) times a week.        . ranitidine (ZANTAC) 150 MG tablet Take 1 tablet (150 mg total) by mouth 2 (two) times daily.  180 tablet  3  . triamterene-hydrochlorothiazide (MAXZIDE-25) 37.5-25 MG per tablet Take 0.5 each (0.5 tablets total) by mouth daily.  90 tablet  3   Current Facility-Administered Medications on File Prior to Visit  Medication Dose Route Frequency Provider Last Rate Last Dose  . methylPREDNISolone acetate (DEPO-MEDROL) injection 40 mg  40 mg Intra-articular Once Carrie Mew, MD        BP 130/80  Pulse 72  Temp 98.3 F (36.8 C)  Resp 16  Ht 5\' 2"  (1.575 m)  Wt 160 lb (72.576 kg)  BMI  29.26 kg/m2       Objective:   Physical Exam  Nursing note and vitals reviewed. Constitutional: She is oriented to person, place, and time. She appears well-developed and well-nourished. No distress.  HENT:  Head: Normocephalic and atraumatic.  Right Ear: External ear normal.  Left Ear: External ear normal.  Nose: Nose normal.  Mouth/Throat: Oropharynx is clear and moist.  Eyes: Conjunctivae and EOM are normal. Pupils are equal, round, and reactive to light.  Neck: Normal range of motion. Neck supple. No JVD present. No tracheal deviation present. No thyromegaly present.  Cardiovascular: Normal rate, regular rhythm, normal heart sounds and intact distal pulses.   No murmur heard. Pulmonary/Chest: Effort normal and breath sounds normal. She has no wheezes.  She exhibits no tenderness.  Abdominal: Soft. Bowel sounds are normal.  Musculoskeletal: Normal range of motion. She exhibits no edema and no tenderness.  Lymphadenopathy:    She has no cervical adenopathy.  Neurological: She is alert and oriented to person, place, and time. She has normal reflexes. No cranial nerve deficit.  Skin: Skin is warm and dry. She is not diaphoretic.  Psychiatric: She has a normal mood and affect. Her behavior is normal.          Assessment & Plan:  The trend shows gradual improvement of the creatinine as well as gradual improvement of the glomerular filtration rate indicating that changing to every other day on the diuretic was an appropriate plan.  She has not experienced worsening of her Mnire's she will continue with the diuretic on an every other day basis and continue to encourage fluid hydration

## 2011-03-10 NOTE — Patient Instructions (Signed)
The patient is instructed to continue all medications as prescribed. Schedule followup with check out clerk upon leaving the clinic  

## 2011-04-04 ENCOUNTER — Telehealth: Payer: Self-pay | Admitting: *Deleted

## 2011-04-04 NOTE — Telephone Encounter (Signed)
Left message on machine We sent that in on 2-6 so there must be a problem at [facility]- please give them a call and ask what is going on

## 2011-04-04 NOTE — Telephone Encounter (Signed)
[facility] needs refills on Montelukast 10 mg 90 days Case number [id number] or phone number [phone].

## 2011-04-05 ENCOUNTER — Other Ambulatory Visit: Payer: Self-pay | Admitting: *Deleted

## 2011-04-05 MED ORDER — MONTELUKAST SODIUM 10 MG PO TABS: 10.0000 mg | ORAL_TABLET | Freq: Every day | ORAL | Status: DC

## 2011-04-19 ENCOUNTER — Other Ambulatory Visit: Payer: Self-pay | Admitting: Internal Medicine

## 2011-05-02 ENCOUNTER — Other Ambulatory Visit (INDEPENDENT_AMBULATORY_CARE_PROVIDER_SITE_OTHER): Payer: BC Managed Care – PPO

## 2011-05-02 DIAGNOSIS — N289 Disorder of kidney and ureter, unspecified: Secondary | ICD-10-CM

## 2011-05-02 LAB — BASIC METABOLIC PANEL
BUN: 27 mg/dL — ABNORMAL HIGH (ref 6–23)
Chloride: 107 mEq/L (ref 96–112)
Potassium: 3.8 mEq/L (ref 3.5–5.1)

## 2011-05-09 ENCOUNTER — Ambulatory Visit (INDEPENDENT_AMBULATORY_CARE_PROVIDER_SITE_OTHER): Payer: BC Managed Care – PPO | Admitting: Internal Medicine

## 2011-05-09 VITALS — BP 134/80 | HR 72 | Temp 98.3°F | Resp 16 | Ht 62.0 in | Wt 162.0 lb

## 2011-05-09 DIAGNOSIS — T887XXA Unspecified adverse effect of drug or medicament, initial encounter: Secondary | ICD-10-CM

## 2011-05-09 DIAGNOSIS — N181 Chronic kidney disease, stage 1: Secondary | ICD-10-CM

## 2011-05-09 NOTE — Patient Instructions (Signed)
The patient is instructed to continue all medications as prescribed. Schedule followup with check out clerk upon leaving the clinic  

## 2011-05-09 NOTE — Progress Notes (Signed)
Subjective:    Patient ID: Kristin Bonilla, female    DOB: 31-Dec-1950, 61 y.o.   MRN: [id number]  HPI  Patient is a 61 year old female followed for hypertension hyperlipidemia gastroesophageal reflux she presents today to discuss dysthymic disorder.  She has had a increasing problem with mood and anxiety control.  She is approaching retirement and has increased anxiety.  This is also shown itself increased gastroesophageal reflux and irritable bowel symptomatology including constipation alternating with diarrhea Slight worsening of creatinine has been noted possibly medication side effects possibly early renal disease versus dehydration  Review of Systems  Constitutional: Negative for activity change, appetite change and fatigue.  HENT: Negative for ear pain, congestion, neck pain, postnasal drip and sinus pressure.   Eyes: Negative for redness and visual disturbance.  Respiratory: Negative for cough, shortness of breath and wheezing.   Gastrointestinal: Positive for abdominal pain and abdominal distention.  Genitourinary: Negative for dysuria, frequency and menstrual problem.  Musculoskeletal: Negative for myalgias, joint swelling and arthralgias.  Skin: Negative for rash and wound.  Neurological: Negative for dizziness, weakness and headaches.  Hematological: Negative for adenopathy. Does not bruise/bleed easily.  Psychiatric/Behavioral: Positive for dysphoric mood and agitation. Negative for disturbed wake/sleep cycle and decreased concentration.   Past Medical History  Diagnosis Date  . Menopause   . Meniere disease   . Hyperlipidemia   . Arthritis   . GERD (gastroesophageal reflux disease)     History   Social History  . Marital Status: Married    Spouse Name: N/A    Number of Children: N/A  . Years of Education: N/A   Occupational History  . Not on file.   Social History Main Topics  . Smoking status: Never Smoker   . Smokeless tobacco: Never Used  . Alcohol  Use: No  . Drug Use: No  . Sexually Active: Yes   Other Topics Concern  . Not on file   Social History Narrative  . No narrative on file    No past surgical history on file.  Family History  Problem Relation Age of Onset  . Heart disease    . Cancer Father     melanoma    Allergies  Allergen Reactions  . Erythromycin   . Sulfonamide Derivatives     Current Outpatient Prescriptions on File Prior to Visit  Medication Sig Dispense Refill  . Biotin 1000 MCG tablet Take 1,000 mcg by mouth 3 (three) times daily.       Marland Kitchen buPROPion (WELLBUTRIN XL) 150 MG 24 hr tablet TAKE 1 TABLET DAILY  90 tablet  2  . Calcium Carbonate-Vit D-Min (CALCIUM 1200 PO) Take by mouth daily.        . cetirizine (ZYRTEC) 10 MG tablet Take 10 mg by mouth daily.        . fish oil-omega-3 fatty acids 1000 MG capsule Take 2 g by mouth daily.      . montelukast (SINGULAIR) 10 MG tablet Take 1 tablet (10 mg total) by mouth at bedtime.  90 tablet  1  . Multiple Vitamin (MULTIVITAMIN) tablet Take 1 tablet by mouth daily.      . polyethylene glycol powder (MIRALAX) powder Take 17 g by mouth 2 (two) times a week.        . triamterene-hydrochlorothiazide (MAXZIDE-25) 37.5-25 MG per tablet Take 0.5 each (0.5 tablets total) by mouth daily.  90 tablet  3  . fenofibrate 160 MG tablet TAKE 1 TABLET DAILY  90 tablet  1  .  ranitidine (ZANTAC) 150 MG tablet TAKE 1 TABLET TWICE A DAY  180 tablet  2   Current Facility-Administered Medications on File Prior to Visit  Medication Dose Route Frequency Provider Last Rate Last Dose  . methylPREDNISolone acetate (DEPO-MEDROL) injection 40 mg  40 mg Intra-articular Once Stacie Glaze, MD        BP 134/80  Pulse 72  Temp 98.3 F (36.8 C)  Resp 16  Ht 5\' 2"  (1.575 m)  Wt 162 lb (73.483 kg)  BMI 29.63 kg/m2       Objective:   Physical Exam  Nursing note and vitals reviewed. Constitutional: She is oriented to person, place, and time. She appears well-developed and  well-nourished. No distress.  HENT:  Head: Normocephalic and atraumatic.  Right Ear: External ear normal.  Left Ear: External ear normal.  Nose: Nose normal.  Mouth/Throat: Oropharynx is clear and moist.  Eyes: Conjunctivae normal and EOM are normal. Pupils are equal, round, and reactive to light.  Neck: Normal range of motion. Neck supple. No JVD present. No tracheal deviation present. No thyromegaly present.  Cardiovascular: Normal rate, regular rhythm, normal heart sounds and intact distal pulses.   No murmur heard. Pulmonary/Chest: Effort normal and breath sounds normal. She has no wheezes. She exhibits no tenderness.  Abdominal: Soft. Bowel sounds are normal. She exhibits distension. There is tenderness.  Musculoskeletal: Normal range of motion. She exhibits no edema and no tenderness.  Lymphadenopathy:    She has no cervical adenopathy.  Neurological: She is alert and oriented to person, place, and time. She has normal reflexes. No cranial nerve deficit.  Skin: Skin is warm and dry. She is not diaphoretic.          Assessment & Plan:  Stable hypertension.  Worsening irritable bowel syndrome secondary to anxiety depression discuss alternative medications to Wellbutrin trial of SSRI may be warranted to help with the anxiety component of her depression.  Continued use of diazepam as a anxiety a lytic.  Asthma stable. Irritable bowel has been more recently constipation prone leaving to increase use of MiraLax Monitoring of basic metabolic panel in the setting of chronic renal insufficiency that may be due to dehydration

## 2011-06-25 ENCOUNTER — Other Ambulatory Visit: Payer: Self-pay | Admitting: Internal Medicine

## 2011-06-29 ENCOUNTER — Other Ambulatory Visit: Payer: Self-pay | Admitting: Internal Medicine

## 2011-08-01 ENCOUNTER — Other Ambulatory Visit (INDEPENDENT_AMBULATORY_CARE_PROVIDER_SITE_OTHER): Payer: BC Managed Care – PPO

## 2011-08-01 DIAGNOSIS — T887XXA Unspecified adverse effect of drug or medicament, initial encounter: Secondary | ICD-10-CM

## 2011-08-01 LAB — BASIC METABOLIC PANEL
Calcium: 10.1 mg/dL (ref 8.4–10.5)
GFR: 50.3 mL/min — ABNORMAL LOW (ref >60.00)
Potassium: 4.1 mEq/L (ref 3.5–5.1)
Sodium: 144 mEq/L (ref 135–145)

## 2011-08-08 ENCOUNTER — Ambulatory Visit (INDEPENDENT_AMBULATORY_CARE_PROVIDER_SITE_OTHER): Payer: BC Managed Care – PPO | Admitting: Internal Medicine

## 2011-08-08 ENCOUNTER — Encounter: Payer: Self-pay | Admitting: Internal Medicine

## 2011-08-08 VITALS — BP 110/70 | HR 72 | Temp 98.2°F | Resp 16 | Ht 62.0 in | Wt 162.0 lb

## 2011-08-08 DIAGNOSIS — H8109 Meniere's disease, unspecified ear: Secondary | ICD-10-CM

## 2011-08-08 DIAGNOSIS — K589 Irritable bowel syndrome without diarrhea: Secondary | ICD-10-CM

## 2011-08-08 DIAGNOSIS — N289 Disorder of kidney and ureter, unspecified: Secondary | ICD-10-CM

## 2011-08-08 DIAGNOSIS — I10 Essential (primary) hypertension: Secondary | ICD-10-CM

## 2011-08-08 MED ORDER — DIAZEPAM 5 MG PO TABS: 5.0000 mg | ORAL_TABLET | Freq: Two times a day (BID) | ORAL | Status: DC | PRN

## 2011-08-08 NOTE — Progress Notes (Signed)
Subjective:    Patient ID: Kristin Bonilla, female    DOB: [birth year], 61 y.o.   MRN: [id number]  HPI Patient presents for followup of hyperlipidemia.  She also wishes to discuss diet and exercise since she continues to gain weight in the face of needing to lose weight for multiple medical problems.  We also discussed her irritable bowel symptoms she has increased episodes of diarrhea associated with cramping intermittently and followed by constipation.     Review of Systems  Constitutional: Negative for activity change, appetite change and fatigue.  HENT: Negative for ear pain, congestion, neck pain, postnasal drip and sinus pressure.   Eyes: Negative for redness and visual disturbance.  Respiratory: Negative for cough, shortness of breath and wheezing.   Gastrointestinal: Negative for abdominal pain and abdominal distention.  Genitourinary: Negative for dysuria, frequency and menstrual problem.  Musculoskeletal: Negative for myalgias, joint swelling and arthralgias.  Skin: Negative for rash and wound.  Neurological: Negative for dizziness, weakness and headaches.  Hematological: Negative for adenopathy. Does not bruise/bleed easily.  Psychiatric/Behavioral: Negative for disturbed wake/sleep cycle and decreased concentration.   Past Medical History  Diagnosis Date  . Menopause   . Meniere disease   . Hyperlipidemia     History   Social History  . Marital Status: Married    Spouse Name: N/A    Number of Children: N/A  . Years of Education: N/A   Occupational History  . Not on file.   Social History Main Topics  . Smoking status: Never Smoker   . Smokeless tobacco: Not on file  . Alcohol Use: No  . Drug Use: No  . Sexually Active: Yes   Other Topics Concern  . Not on file   Social History Narrative  . No narrative on file    No past surgical history on file.  Family History  Problem Relation Age of Onset  . Heart disease      Allergies  Allergen  Reactions  . Erythromycin   . Sulfonamide Derivatives     Current Outpatient Prescriptions on File Prior to Visit  Medication Sig Dispense Refill  . Biotin 1000 MCG tablet Take 1,000 mcg by mouth 2 (two) times daily.        Marland Kitchen buPROPion (WELLBUTRIN XL) 150 MG 24 hr tablet TAKE 1 TABLET DAILY  90 tablet  2  . Calcium Carbonate-Vit D-Min (CALCIUM 1200 PO) Take by mouth daily.        . cetirizine (ZYRTEC) 10 MG tablet Take 10 mg by mouth daily.        . diazepam (VALIUM) 5 MG tablet Take 1 tablet (5 mg total) by mouth every 12 (twelve) hours as needed.  180 tablet  1  . fenofibrate 160 MG tablet TAKE 1 TABLET DAILY  90 tablet  1  . fish oil-omega-3 fatty acids 1000 MG capsule Take 2 g by mouth daily.      . montelukast (SINGULAIR) 10 MG tablet Take 1 tablet (10 mg total) by mouth at bedtime.  90 tablet  1  . Multiple Vitamin (MULTIVITAMIN) tablet Take 1 tablet by mouth daily.      . polyethylene glycol powder (MIRALAX) powder Take 17 g by mouth 2 (two) times a week.        . ranitidine (ZANTAC) 150 MG tablet TAKE 1 TABLET TWICE A DAY  180 tablet  2  . triamterene-hydrochlorothiazide (MAXZIDE-25) 37.5-25 MG per tablet Take 0.5 each (0.5 tablets total) by mouth daily.  90  tablet  3   Current Facility-Administered Medications on File Prior to Visit  Medication Dose Route Frequency Provider Last Rate Last Dose  . methylPREDNISolone acetate (DEPO-MEDROL) injection 40 mg  40 mg Intra-articular Once Stacie Glaze, MD        BP 110/70  Pulse 72  Temp 98.2 F (36.8 C)  Resp 16  Ht 5\' 2"  (1.575 m)  Wt 162 lb (73.483 kg)  BMI 29.63 kg/m2       Objective:   Physical Exam  Nursing note and vitals reviewed. Constitutional: She is oriented to person, place, and time. She appears well-developed and well-nourished. No distress.  HENT:  Head: Normocephalic and atraumatic.  Right Ear: External ear normal.  Left Ear: External ear normal.  Nose: Nose normal.  Mouth/Throat: Oropharynx is clear  and moist.  Eyes: Conjunctivae and EOM are normal. Pupils are equal, round, and reactive to light.  Neck: Normal range of motion. Neck supple. No JVD present. No tracheal deviation present. No thyromegaly present.  Cardiovascular: Normal rate, regular rhythm, normal heart sounds and intact distal pulses.   No murmur heard. Pulmonary/Chest: Effort normal and breath sounds normal. She has no wheezes. She exhibits no tenderness.  Abdominal: Soft. Bowel sounds are normal.  Musculoskeletal: Normal range of motion. She exhibits no edema and no tenderness.  Lymphadenopathy:    She has no cervical adenopathy.  Neurological: She is alert and oriented to person, place, and time. She has normal reflexes. No cranial nerve deficit.  Skin: Skin is warm and dry. She is not diaphoretic.  Psychiatric: She has a normal mood and affect. Her behavior is normal.          Assessment & Plan:  discussed gluten free diet Hearing loss and Mnire's Discussed findings of the ENT is whether not she would want to be referred to [facility] for further evaluation.  Reviewed hyperlipidemia lab set goals for weight loss exercise.  Stable dysthymic disorder stable irritable bowel

## 2011-08-15 ENCOUNTER — Telehealth: Payer: Self-pay | Admitting: Internal Medicine

## 2011-08-15 NOTE — Telephone Encounter (Signed)
Caller: Kristin Bonilla/Patient; PCP: Darryll Capers; CB#: [phone]; ; ; Call regarding Stepped On A. Nail That Went Into Foot. When She Pulled It Out, Did Not Bleed. Washed With Soap and Water, Hydrogen Peroxide, ABX Ointment and A. Bandaid. Onset Today, 08/15/11 @. 1415.; All emeregent sxs per Abrasions, Lacerations and Puncture Wounds protocol r/o. Home care advice given. Verified Tetanus UTD in EPIC.

## 2011-09-05 ENCOUNTER — Telehealth: Payer: Self-pay | Admitting: Internal Medicine

## 2011-09-05 ENCOUNTER — Encounter: Payer: Self-pay | Admitting: Internal Medicine

## 2011-09-05 NOTE — Telephone Encounter (Signed)
Recall Project: no contact with pt, letter mailed °

## 2011-09-20 ENCOUNTER — Ambulatory Visit (INDEPENDENT_AMBULATORY_CARE_PROVIDER_SITE_OTHER): Payer: BC Managed Care – PPO | Admitting: Family Medicine

## 2011-09-20 ENCOUNTER — Encounter: Payer: Self-pay | Admitting: Family Medicine

## 2011-09-20 VITALS — BP 110/80 | Temp 98.5°F | Wt 159.0 lb

## 2011-09-20 DIAGNOSIS — L0232 Furuncle of buttock: Secondary | ICD-10-CM

## 2011-09-20 DIAGNOSIS — L0233 Carbuncle of buttock: Secondary | ICD-10-CM

## 2011-09-20 MED ORDER — DOXYCYCLINE HYCLATE 100 MG PO CAPS: 100.0000 mg | ORAL_CAPSULE | Freq: Two times a day (BID) | ORAL | Status: DC

## 2011-09-20 NOTE — Progress Notes (Signed)
  Subjective:    Patient ID: Kristin Bonilla, female    DOB: 30-Jun-1950, 62 y.o.   MRN: [id number]  HPI Here for 5 days of a painful lump on the left buttock. No fever. Applying heat.    Review of Systems  Constitutional: Negative.        Objective:   Physical Exam  Constitutional: She appears well-developed and well-nourished.       She avoids sitting on that side due to pain   Skin:       The left buttock has a firm, tender, erythematous mass towards the intergluteal fold           Assessment & Plan:  Use hot tub soaks. Start on Doxycycline. Recheck if this gets any larger

## 2011-09-23 ENCOUNTER — Encounter: Payer: Self-pay | Admitting: Family Medicine

## 2011-09-23 ENCOUNTER — Other Ambulatory Visit: Payer: Self-pay | Admitting: Internal Medicine

## 2011-09-23 ENCOUNTER — Telehealth: Payer: Self-pay | Admitting: Internal Medicine

## 2011-09-23 ENCOUNTER — Ambulatory Visit (INDEPENDENT_AMBULATORY_CARE_PROVIDER_SITE_OTHER): Payer: BC Managed Care – PPO | Admitting: Family Medicine

## 2011-09-23 VITALS — BP 110/70 | HR 99 | Temp 98.6°F | Wt 155.0 lb

## 2011-09-23 DIAGNOSIS — L03317 Cellulitis of buttock: Secondary | ICD-10-CM

## 2011-09-23 DIAGNOSIS — L0231 Cutaneous abscess of buttock: Secondary | ICD-10-CM

## 2011-09-23 MED ORDER — CIPROFLOXACIN HCL 500 MG PO TABS: 500.0000 mg | ORAL_TABLET | Freq: Two times a day (BID) | ORAL | Status: DC

## 2011-09-23 NOTE — Progress Notes (Signed)
  Subjective:    Patient ID: Kristin Bonilla, female    DOB: Jul 04, 1950, 61 y.o.   MRN: [id number]  HPI Here to recheck an abscess on the right buttock. She was seen 3 days ago and was started on Doxycycline. She feels about the same now with pain in the area. It is difficult to sit. No fever.    Review of Systems  Constitutional: Negative.        Objective:   Physical Exam  Constitutional: She appears well-developed and well-nourished.  Skin:       The area of erythema and firmness  on the right buttock is slightly smaller than before but is still quite tender.           Assessment & Plan:  We lanced the abscess with a scalpel today and had a return of some bloody purulent material. This was sent for a culture. We will add Cipro to the Doxycycline. Recheck prn

## 2011-09-23 NOTE — Telephone Encounter (Signed)
Caller: Dametra/Patient; Patient Name: Kristin Bonilla; PCP: Darryll Capers (Adults only); Best Callback Phone Number: [phone].  Patient states shewas seen in office 09/20/11, by Dr. Clent Ridges, for an abscess on buttock. States she was prescribed Doxycyline and was advised to return call to office at the end of the week to report progress. Patient states area is approximately the size of a nickel. States white "head" on top of abscess, approximately the size of a pencil eraser. States some of the skin has peeled off. States area is draining yellowish, watery, blood tinged drainage. Denies purulent drainage. Patient states she has been applying warm compresses. Afebrile. States no increase in pain but are remains tender. States area is "hard" to touch. States area has increased in size since office visit 09/20/11. Triage per Rectal Symptoms Protocol. No emergent symptoms identified. Care advice given per guidelines related to positive triage assessment for " Constant pain in rectum or rectal area AND Swelling."  Patient advised warm sitz bath, continue Doxycycline as prescribed. Appointment scheduled at office with Dr. Clent Ridges due to patient currently taking Doxycycline and had initial evaluation. Appointment scheduled for 1330 09/23/11 with Dr. Clent Ridges. Patient advised to return call if symtoms increase or fever develops prior to appointment time. Call back parameters reviewed. Patient verbalizes understanding.

## 2011-09-23 NOTE — Addendum Note (Signed)
Addended by: Gershon Crane A on: 09/23/2011 03:45 PM   Modules accepted: Orders

## 2011-09-27 LAB — WOUND CULTURE: Gram Stain: NONE SEEN

## 2011-09-28 ENCOUNTER — Telehealth: Payer: Self-pay | Admitting: Family Medicine

## 2011-09-28 NOTE — Telephone Encounter (Signed)
Pt would like blood work results ordered by Dr Clent Ridges

## 2011-09-28 NOTE — Progress Notes (Signed)
Quick Note:  I spoke with pt ______ 

## 2011-09-28 NOTE — Telephone Encounter (Signed)
I spoke with pt and gave results.  

## 2011-09-30 ENCOUNTER — Ambulatory Visit (INDEPENDENT_AMBULATORY_CARE_PROVIDER_SITE_OTHER): Payer: BC Managed Care – PPO | Admitting: Family Medicine

## 2011-09-30 ENCOUNTER — Other Ambulatory Visit: Payer: Self-pay | Admitting: Internal Medicine

## 2011-09-30 ENCOUNTER — Encounter (INDEPENDENT_AMBULATORY_CARE_PROVIDER_SITE_OTHER): Payer: Self-pay | Admitting: General Surgery

## 2011-09-30 ENCOUNTER — Encounter: Payer: Self-pay | Admitting: Family Medicine

## 2011-09-30 ENCOUNTER — Ambulatory Visit (INDEPENDENT_AMBULATORY_CARE_PROVIDER_SITE_OTHER): Payer: BC Managed Care – PPO | Admitting: General Surgery

## 2011-09-30 VITALS — BP 108/72 | HR 91 | Temp 98.5°F | Wt 156.0 lb

## 2011-09-30 VITALS — BP 110/80 | HR 89 | Temp 98.2°F | Ht 62.5 in | Wt 157.2 lb

## 2011-09-30 DIAGNOSIS — L03119 Cellulitis of unspecified part of limb: Secondary | ICD-10-CM

## 2011-09-30 DIAGNOSIS — L0232 Furuncle of buttock: Secondary | ICD-10-CM

## 2011-09-30 DIAGNOSIS — L02419 Cutaneous abscess of limb, unspecified: Secondary | ICD-10-CM

## 2011-09-30 DIAGNOSIS — L0231 Cutaneous abscess of buttock: Secondary | ICD-10-CM

## 2011-09-30 MED ORDER — DOXYCYCLINE HYCLATE 100 MG PO CAPS: 100.0000 mg | ORAL_CAPSULE | Freq: Two times a day (BID) | ORAL | Status: AC

## 2011-09-30 NOTE — Progress Notes (Signed)
Subjective:     Patient ID: Kristin Bonilla, female   DOB: [birth year], 61 y.o.   MRN: [id number]  HPI The patient is a 61 year old female with approximately 2 week history of any new abscess. Patient presented to her PCP evaluation was done on antibiotics which was key in her healing process. The area and never had any drainage has decreased in size with previous 2 weeks. Patient now able to sit without any complaints.  Review of Systems  All other systems reviewed and are negative.       Objective:   Physical Exam  Constitutional: She is oriented to person, place, and time. She appears well-developed and well-nourished.  HENT:  Head: Normocephalic and atraumatic.  Eyes: Conjunctivae normal are normal. Pupils are equal, round, and reactive to light.  Neck: Neck supple.  Cardiovascular: Normal rate and regular rhythm.   Pulmonary/Chest: Breath sounds normal.  Abdominal: Soft. Bowel sounds are normal.  Genitourinary: Vagina normal.          Small area of induration without any drainage or erythema  Neurological: She is alert and oriented to person, place, and time.       Assessment:     61 year old female with a healing gluteal abscess.    Plan:     1. Continue antibiotics as per Dr. Clent Ridges.  2. Secondary to the healing and the antibiotics I believe that the abscess has decreased and likely resolved at this time. The area of induration that she feels most likely scar tissue from the healing of the abscess. I spoke with her and recommended warm compresses to the area until the area is soft again.  3. Patient followup with Dr. Clent Ridges as scheduled.  Thank you for the referral.

## 2011-09-30 NOTE — Progress Notes (Signed)
  Subjective:    Patient ID: Kristin Bonilla, female    DOB: 01/05/1951, 61 y.o.   MRN: [id number]  HPI Here to follow up a boil on the left buttock that grew MRSA out of the culture we obtained. She has been on Doxycycline for 10 days, and she feels much better. There is less pain and the knot is much smaller.    Review of Systems  Constitutional: Negative.        Objective:   Physical Exam  Constitutional: She appears well-developed and well-nourished.  Skin:       The firm knot on the left buttock is smaller and less tender. No erythema now           Assessment & Plan:  After the current 2 weeks of Doxycycline in finished next week, she will take an additional 30 days of this to eradicate the MRSA. Recheck prn

## 2011-10-14 ENCOUNTER — Encounter: Payer: Self-pay | Admitting: Internal Medicine

## 2011-12-01 ENCOUNTER — Other Ambulatory Visit: Payer: Self-pay | Admitting: Internal Medicine

## 2011-12-05 ENCOUNTER — Other Ambulatory Visit (INDEPENDENT_AMBULATORY_CARE_PROVIDER_SITE_OTHER): Payer: BC Managed Care – PPO

## 2011-12-05 DIAGNOSIS — Z0000 Encounter for general adult medical examination without abnormal findings: Secondary | ICD-10-CM

## 2011-12-05 LAB — CBC WITH DIFFERENTIAL/PLATELET
Basophils Relative: 2.3 % (ref 0.0–3.0)
Eosinophils Absolute: 0.2 10*3/uL (ref 0.0–0.7)
HCT: 39.5 % (ref 36.0–46.0)
Lymphs Abs: 1.4 10*3/uL (ref 0.7–4.0)
MCHC: 33.1 g/dL (ref 30.0–36.0)
MCV: 90.1 fl (ref 78.0–100.0)
Monocytes Absolute: 0.4 10*3/uL (ref 0.1–1.0)
Neutrophils Relative %: 36.1 % — ABNORMAL LOW (ref 43.0–77.0)
Platelets: 267 10*3/uL (ref 150.0–400.0)

## 2011-12-05 LAB — BASIC METABOLIC PANEL
BUN: 25 mg/dL — ABNORMAL HIGH (ref 6–23)
CO2: 30 mEq/L (ref 19–32)
Calcium: 9.9 mg/dL (ref 8.4–10.5)
GFR: 51.26 mL/min — ABNORMAL LOW (ref >60.00)
Glucose, Bld: 105 mg/dL — ABNORMAL HIGH (ref 70–99)
Sodium: 143 mEq/L (ref 135–145)

## 2011-12-05 LAB — HEPATIC FUNCTION PANEL
Bilirubin, Direct: 0.1 mg/dL (ref 0.0–0.3)
Total Bilirubin: 0.4 mg/dL (ref 0.3–1.2)
Total Protein: 7.4 g/dL (ref 6.0–8.3)

## 2011-12-05 LAB — POCT URINALYSIS DIPSTICK
Ketones, UA: NEGATIVE
Leukocytes, UA: NEGATIVE
Nitrite, UA: NEGATIVE
Protein, UA: NEGATIVE
Urobilinogen, UA: 0.2

## 2011-12-05 LAB — LIPID PANEL
Cholesterol: 205 mg/dL — ABNORMAL HIGH (ref 0–200)
VLDL: 14.8 mg/dL (ref 0.0–40.0)

## 2011-12-05 LAB — TSH: TSH: 4.31 u[IU]/mL (ref 0.35–5.50)

## 2011-12-12 ENCOUNTER — Encounter: Payer: Self-pay | Admitting: Internal Medicine

## 2011-12-12 ENCOUNTER — Ambulatory Visit (INDEPENDENT_AMBULATORY_CARE_PROVIDER_SITE_OTHER): Payer: BC Managed Care – PPO | Admitting: Internal Medicine

## 2011-12-12 VITALS — BP 130/80 | HR 76 | Temp 98.2°F | Resp 16 | Ht 60.25 in | Wt 164.0 lb

## 2011-12-12 DIAGNOSIS — Z0000 Encounter for general adult medical examination without abnormal findings: Secondary | ICD-10-CM

## 2011-12-12 DIAGNOSIS — N289 Disorder of kidney and ureter, unspecified: Secondary | ICD-10-CM

## 2011-12-12 DIAGNOSIS — E785 Hyperlipidemia, unspecified: Secondary | ICD-10-CM

## 2011-12-12 DIAGNOSIS — Z23 Encounter for immunization: Secondary | ICD-10-CM

## 2011-12-12 NOTE — Patient Instructions (Signed)
The patient is instructed to continue all medications as prescribed. Schedule followup with check out clerk upon leaving the clinic  

## 2011-12-12 NOTE — Progress Notes (Signed)
Subjective:    Patient ID: Kristin Bonilla, female    DOB: 24-Dec-1950, 61 y.o.   MRN: [id number]  HPI for yearly examination.  We discussed weight its effect on cholesterol as well as blood pressure.  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  HENT: Negative for ear pain, congestion, neck pain, postnasal drip and sinus pressure.   Eyes: Negative for redness and visual disturbance.  Respiratory: Negative for cough, shortness of breath and wheezing.   Gastrointestinal: Negative for abdominal pain and abdominal distention.  Genitourinary: Negative for dysuria, frequency and menstrual problem.  Musculoskeletal: Negative for myalgias, joint swelling and arthralgias.  Skin: Negative for rash and wound.  Neurological: Negative for dizziness, weakness and headaches.  Hematological: Negative for adenopathy. Does not bruise/bleed easily.  Psychiatric/Behavioral: Negative for sleep disturbance and decreased concentration.   Past Medical History  Diagnosis Date  . Menopause   . Meniere disease   . Hyperlipidemia   . Arthritis   . GERD (gastroesophageal reflux disease)     History   Social History  . Marital Status: Married    Spouse Name: N/A    Number of Children: N/A  . Years of Education: N/A   Occupational History  . Not on file.   Social History Main Topics  . Smoking status: Never Smoker   . Smokeless tobacco: Never Used  . Alcohol Use: No  . Drug Use: No  . Sexually Active: Yes   Other Topics Concern  . Not on file   Social History Narrative  . No narrative on file    No past surgical history on file.  Family History  Problem Relation Age of Onset  . Heart disease    . Cancer Father     melanoma    Allergies  Allergen Reactions  . Erythromycin   . Sulfonamide Derivatives     Current Outpatient Prescriptions on File Prior to Visit    Medication Sig Dispense Refill  . Biotin 1000 MCG tablet Take 1,000 mcg by mouth 3 (three) times daily.       Marland Kitchen buPROPion (WELLBUTRIN XL) 150 MG 24 hr tablet TAKE 1 TABLET DAILY  90 tablet  2  . Calcium Carbonate-Vit D-Min (CALCIUM 1200 PO) Take by mouth daily.        . cetirizine (ZYRTEC) 10 MG tablet Take 10 mg by mouth daily.        . diazepam (VALIUM) 5 MG tablet Take 1 tablet (5 mg total) by mouth every 12 (twelve) hours as needed.  180 tablet  1  . fenofibrate 160 MG tablet TAKE 1 TABLET DAILY  90 tablet  0  . fish oil-omega-3 fatty acids 1000 MG capsule Take 2 g by mouth every other day.       . montelukast (SINGULAIR) 10 MG tablet Take 1 tablet (10 mg total) by mouth at bedtime.  90 tablet  1  . Multiple Vitamin (MULTIVITAMIN) tablet Take 1 tablet by mouth daily.      . polyethylene glycol powder (MIRALAX) powder Take 17 g by mouth 2 (two) times a week.        . ranitidine (ZANTAC) 150 MG tablet TAKE 1 TABLET TWICE A DAY  180 tablet  2  . triamterene-hydrochlorothiazide (DYAZIDE) 37.5-25 MG per capsule TAKE 1 CAPSULE DAILY  90 capsule  2  . [DISCONTINUED] triamterene-hydrochlorothiazide (MAXZIDE-25) 37.5-25 MG per tablet Take 0.5  each (0.5 tablets total) by mouth daily.  90 tablet  3   Current Facility-Administered Medications on File Prior to Visit  Medication Dose Route Frequency Provider Last Rate Last Dose  . methylPREDNISolone acetate (DEPO-MEDROL) injection 40 mg  40 mg Intra-articular Once Stacie Glaze, MD        BP 130/80  Pulse 76  Temp 98.2 F (36.8 C)  Resp 16  Ht 5' 0.25" (1.53 m)  Wt 164 lb (74.39 kg)  BMI 31.76 kg/m2        Objective:   Physical Exam  Constitutional: She is oriented to person, place, and time. She appears well-developed and well-nourished. No distress.  HENT:  Head: Normocephalic and atraumatic.  Right Ear: External ear normal.  Left Ear: External ear normal.  Nose: Nose normal.  Mouth/Throat: Oropharynx is clear and moist.  Eyes:  Conjunctivae normal and EOM are normal. Pupils are equal, round, and reactive to light.  Neck: Normal range of motion. Neck supple. No JVD present. No tracheal deviation present. No thyromegaly present.  Cardiovascular: Normal rate, regular rhythm, normal heart sounds and intact distal pulses.   No murmur heard. Pulmonary/Chest: Effort normal and breath sounds normal. She has no wheezes. She exhibits no tenderness.  Abdominal: Soft. Bowel sounds are normal.  Musculoskeletal: Normal range of motion. She exhibits no edema and no tenderness.  Lymphadenopathy:    She has no cervical adenopathy.  Neurological: She is alert and oriented to person, place, and time. She has normal reflexes. No cranial nerve deficit.  Skin: Skin is warm and dry. She is not diaphoretic.  Psychiatric: She has a normal mood and affect. Her behavior is normal.          Assessment & Plan:   This is a routine physical examination for this healthy  Female. Reviewed all health maintenance protocols including mammography colonoscopy bone density and reviewed appropriate screening labs. Her immunization history was reviewed as well as her current medications and allergies refills of her chronic medications were given and the plan for yearly health maintenance was discussed all orders and referrals were made as appropriate.    10 pound weight loss is the primary intervention for hyperlipidemia and elevated blood pressure

## 2012-02-09 ENCOUNTER — Other Ambulatory Visit: Payer: Self-pay | Admitting: Obstetrics and Gynecology

## 2012-03-02 ENCOUNTER — Other Ambulatory Visit: Payer: Self-pay | Admitting: Internal Medicine

## 2012-03-15 ENCOUNTER — Telehealth: Payer: Self-pay | Admitting: Internal Medicine

## 2012-03-15 MED ORDER — BUPROPION HCL ER (XL) 150 MG PO TB24: 150.0000 mg | ORAL_TABLET | ORAL | Status: DC

## 2012-03-15 MED ORDER — DIAZEPAM 5 MG PO TABS: 5.0000 mg | ORAL_TABLET | Freq: Two times a day (BID) | ORAL | Status: DC | PRN

## 2012-03-15 NOTE — Telephone Encounter (Signed)
[facility] to [facility]

## 2012-03-15 NOTE — Telephone Encounter (Signed)
Pt requesting rx refill of diazepam (VALIUM) 5 MG tablet and buPROPion (WELLBUTRIN XL) 150 MG 24 hr tablet 90 day supply sent to [facility] order pharmacy.

## 2012-03-30 ENCOUNTER — Other Ambulatory Visit (INDEPENDENT_AMBULATORY_CARE_PROVIDER_SITE_OTHER): Payer: BC Managed Care – PPO

## 2012-03-30 DIAGNOSIS — N289 Disorder of kidney and ureter, unspecified: Secondary | ICD-10-CM

## 2012-03-30 DIAGNOSIS — E785 Hyperlipidemia, unspecified: Secondary | ICD-10-CM

## 2012-03-30 LAB — BASIC METABOLIC PANEL
CO2: 27 mEq/L (ref 19–32)
Calcium: 10.8 mg/dL — ABNORMAL HIGH (ref 8.4–10.5)
Glucose, Bld: 100 mg/dL — ABNORMAL HIGH (ref 70–99)
Potassium: 4.3 mEq/L (ref 3.5–5.1)
Sodium: 140 mEq/L (ref 135–145)

## 2012-03-30 LAB — LIPID PANEL
HDL: 62.1 mg/dL (ref >39.00)
LDL Cholesterol: 110 mg/dL — ABNORMAL HIGH (ref 0–99)
Total CHOL/HDL Ratio: 3
Triglycerides: 69 mg/dL (ref 0.0–149.0)
VLDL: 13.8 mg/dL (ref 0.0–40.0)

## 2012-03-30 LAB — HEPATIC FUNCTION PANEL
Albumin: 4.5 g/dL (ref 3.5–5.2)
Total Bilirubin: 0.8 mg/dL (ref 0.3–1.2)

## 2012-04-03 ENCOUNTER — Other Ambulatory Visit: Payer: BC Managed Care – PPO

## 2012-04-06 ENCOUNTER — Encounter: Payer: Self-pay | Admitting: Internal Medicine

## 2012-04-09 ENCOUNTER — Encounter: Payer: Self-pay | Admitting: Internal Medicine

## 2012-04-09 ENCOUNTER — Ambulatory Visit (INDEPENDENT_AMBULATORY_CARE_PROVIDER_SITE_OTHER): Payer: BC Managed Care – PPO | Admitting: Internal Medicine

## 2012-04-09 ENCOUNTER — Other Ambulatory Visit: Payer: Self-pay | Admitting: *Deleted

## 2012-04-09 VITALS — BP 124/80 | HR 72 | Temp 98.0°F | Resp 16 | Ht 62.0 in | Wt 150.0 lb

## 2012-04-09 DIAGNOSIS — L500 Allergic urticaria: Secondary | ICD-10-CM

## 2012-04-09 DIAGNOSIS — T887XXA Unspecified adverse effect of drug or medicament, initial encounter: Secondary | ICD-10-CM

## 2012-04-09 DIAGNOSIS — E785 Hyperlipidemia, unspecified: Secondary | ICD-10-CM

## 2012-04-09 DIAGNOSIS — J309 Allergic rhinitis, unspecified: Secondary | ICD-10-CM

## 2012-04-09 DIAGNOSIS — K219 Gastro-esophageal reflux disease without esophagitis: Secondary | ICD-10-CM

## 2012-04-09 DIAGNOSIS — Z23 Encounter for immunization: Secondary | ICD-10-CM

## 2012-04-09 MED ORDER — FENOFIBRATE 160 MG PO TABS: ORAL_TABLET | ORAL | Status: DC

## 2012-04-09 MED ORDER — RANITIDINE HCL 150 MG PO TABS: ORAL_TABLET | ORAL | Status: DC

## 2012-04-09 MED ORDER — LEVOCETIRIZINE DIHYDROCHLORIDE 5 MG PO TABS: 5.0000 mg | ORAL_TABLET | Freq: Every evening | ORAL | Status: DC

## 2012-04-09 NOTE — Addendum Note (Signed)
Addended by: Willy Eddy on: 04/09/2012 10:27 AM   Modules accepted: Orders

## 2012-04-09 NOTE — Progress Notes (Signed)
Subjective:    Patient ID: Kristin Bonilla, female    DOB: 1950-04-06, 62 y.o.   MRN: [id number]  HPI followup of mild renal insufficiency is seen on lab work hyperlipidemia and liver function and to assess possible side effects of medications.she's been on a fenofibrate with good results her total cholesterol has declined to 186 her triglycerides are 69 her HDL cholesterol remains greater than 50 at 61 and her LDL cholesterol is 110 liver functions are normal.  Renal functions are stable  Due shingles vaccine    Review of Systems  Constitutional: Negative for activity change, appetite change and fatigue.  HENT: Negative for ear pain, congestion, neck pain, postnasal drip and sinus pressure.   Eyes: Negative for redness and visual disturbance.  Respiratory: Negative for cough, shortness of breath and wheezing.   Gastrointestinal: Negative for abdominal pain and abdominal distention.  Genitourinary: Negative for dysuria, frequency and menstrual problem.  Musculoskeletal: Negative for myalgias, joint swelling and arthralgias.  Skin: Negative for rash and wound.  Neurological: Negative for dizziness, weakness and headaches.  Hematological: Negative for adenopathy. Does not bruise/bleed easily.  Psychiatric/Behavioral: Negative for sleep disturbance and decreased concentration.   Past Medical History  Diagnosis Date  . Menopause   . Meniere disease   . Hyperlipidemia   . Arthritis   . GERD (gastroesophageal reflux disease)     History   Social History  . Marital Status: Married    Spouse Name: N/A    Number of Children: N/A  . Years of Education: N/A   Occupational History  . Not on file.   Social History Main Topics  . Smoking status: Never Smoker   . Smokeless tobacco: Never Used  . Alcohol Use: No  . Drug Use: No  . Sexually Active: Yes   Other Topics Concern  . Not on file   Social History Narrative  . No narrative on file    No past surgical history on  file.  Family History  Problem Relation Age of Onset  . Heart disease    . Cancer Father     melanoma    Allergies  Allergen Reactions  . Erythromycin   . Sulfonamide Derivatives     Current Outpatient Prescriptions on File Prior to Visit  Medication Sig Dispense Refill  . aspirin 81 MG tablet Take 81 mg by mouth daily.      . Biotin 1000 MCG tablet Take 1,000 mcg by mouth 3 (three) times daily.       . Calcium Carbonate-Vit D-Min (CALCIUM 1200 PO) Take by mouth daily.        . fish oil-omega-3 fatty acids 1000 MG capsule Take 2 g by mouth every other day.       . montelukast (SINGULAIR) 10 MG tablet Take 1 tablet (10 mg total) by mouth at bedtime.  90 tablet  1  . Multiple Vitamin (MULTIVITAMIN) tablet Take 1 tablet by mouth daily.      . polyethylene glycol powder (MIRALAX) powder Take 17 g by mouth 2 (two) times a week.        . triamterene-hydrochlorothiazide (DYAZIDE) 37.5-25 MG per capsule TAKE 1 CAPSULE DAILY  90 capsule  2  . [DISCONTINUED] cetirizine (ZYRTEC) 10 MG tablet Take 10 mg by mouth daily.        Marland Kitchen buPROPion (WELLBUTRIN XL) 150 MG 24 hr tablet Take 1 tablet (150 mg total) by mouth every morning.  90 tablet  3  . diazepam (VALIUM) 5 MG  tablet Take 1 tablet (5 mg total) by mouth every 12 (twelve) hours as needed.  180 tablet  1   Current Facility-Administered Medications on File Prior to Visit  Medication Dose Route Frequency Provider Last Rate Last Dose  . methylPREDNISolone acetate (DEPO-MEDROL) injection 40 mg  40 mg Intra-articular Once Stacie Glaze, MD        BP 124/80  Pulse 72  Temp(Src) 98 F (36.7 C)  Resp 16  Ht 5\' 2"  (1.575 m)  Wt 150 lb (68.04 kg)  BMI 27.43 kg/m2       Objective:   Physical Exam  Nursing note and vitals reviewed. Constitutional: She is oriented to person, place, and time. She appears well-developed and well-nourished. No distress.  HENT:  Head: Normocephalic and atraumatic.  Right Ear: External ear normal.  Left Ear:  External ear normal.  Nose: Nose normal.  Mouth/Throat: Oropharynx is clear and moist.  Eyes: Conjunctivae and EOM are normal. Pupils are equal, round, and reactive to light.  Neck: Normal range of motion. Neck supple. No JVD present. No tracheal deviation present. No thyromegaly present.  Cardiovascular: Normal rate, regular rhythm, normal heart sounds and intact distal pulses.   No murmur heard. Pulmonary/Chest: Effort normal and breath sounds normal. She has no wheezes. She exhibits no tenderness.  Abdominal: Soft. Bowel sounds are normal.  Musculoskeletal: Normal range of motion. She exhibits no edema and no tenderness.  Lymphadenopathy:    She has no cervical adenopathy.  Neurological: She is alert and oriented to person, place, and time. She has normal reflexes. No cranial nerve deficit.  Skin: Skin is warm and dry. She is not diaphoretic.  Psychiatric: She has a normal mood and affect. Her behavior is normal.          Assessment & Plan:  Shingles vaccine Stable  CRI IBS Discussed protient in diet, exercise and hydration

## 2012-04-13 ENCOUNTER — Telehealth: Payer: Self-pay | Admitting: Internal Medicine

## 2012-04-13 NOTE — Telephone Encounter (Signed)
Patient Information:  Caller Name: Camdyn  Phone: [phone]  Patient: Kristin Bonilla, Kristin Bonilla  Gender: Female  DOB: 17-Jul-1950  Age: 62 Years  PCP: Darryll Capers (Adults only)  Office Follow Up:  Does the office need to follow up with this patient?: No  Instructions For The Office: N/A  RN Note:  Site of imunization is not painful but is red in color. Has itching. No swelling. Home care advice given per Skin Lesion protocol.  Symptoms  Reason For Call & Symptoms: Had shingles vaccine 3-24. Has red area on arm and is itching since 3-27.  Reviewed Health History In EMR: Yes  Reviewed Medications In EMR: Yes  Reviewed Allergies In EMR: Yes  Reviewed Surgeries / Procedures: Yes  Date of Onset of Symptoms: 04/12/2012  Guideline(s) Used:  Adult Immunizations, Contraindications and Precautions  No Protocol Available - Sick Adult  Disposition Per Guideline:   Home Care  Reason For Disposition Reached:   Patient's symptoms are safe to treat at home per nursing judgment  Advice Given:  N/A  Patient Will Follow Care Advice:  YES

## 2012-05-24 ENCOUNTER — Other Ambulatory Visit: Payer: Self-pay | Admitting: Internal Medicine

## 2012-08-27 ENCOUNTER — Other Ambulatory Visit: Payer: Self-pay | Admitting: *Deleted

## 2012-08-27 ENCOUNTER — Encounter: Payer: Self-pay | Admitting: Internal Medicine

## 2012-09-24 ENCOUNTER — Encounter: Payer: Self-pay | Admitting: Internal Medicine

## 2012-09-24 ENCOUNTER — Ambulatory Visit (INDEPENDENT_AMBULATORY_CARE_PROVIDER_SITE_OTHER): Payer: BC Managed Care – PPO | Admitting: Internal Medicine

## 2012-09-24 VITALS — BP 110/72 | HR 72 | Temp 98.6°F | Resp 16 | Ht 62.0 in | Wt 156.0 lb

## 2012-09-24 DIAGNOSIS — N289 Disorder of kidney and ureter, unspecified: Secondary | ICD-10-CM

## 2012-09-24 DIAGNOSIS — K589 Irritable bowel syndrome without diarrhea: Secondary | ICD-10-CM

## 2012-09-24 DIAGNOSIS — E785 Hyperlipidemia, unspecified: Secondary | ICD-10-CM

## 2012-09-24 DIAGNOSIS — R5381 Other malaise: Secondary | ICD-10-CM

## 2012-09-24 LAB — BASIC METABOLIC PANEL
BUN: 30 mg/dL — ABNORMAL HIGH (ref 6–23)
CO2: 28 mEq/L (ref 19–32)
Chloride: 109 mEq/L (ref 96–112)
Creatinine, Ser: 1.1 mg/dL (ref 0.4–1.2)
Potassium: 4.7 mEq/L (ref 3.5–5.1)

## 2012-09-24 LAB — T4, FREE: Free T4: 0.97 ng/dL (ref 0.60–1.60)

## 2012-09-24 NOTE — Progress Notes (Signed)
Subjective:    Patient ID: Kristin Bonilla, female    DOB: 1950/02/05, 62 y.o.   MRN: [id number]  HPI Vague symptoms Took cipro ( she had some left) and that "helped" Describes "feeling like crud"  No SOB, mild congestion, "sighing" No rash, no tick bites or fever, no diarrhea    Review of Systems  Constitutional: Positive for fatigue. Negative for activity change and appetite change.  HENT: Negative for ear pain, congestion, neck pain, postnasal drip and sinus pressure.   Eyes: Negative for redness and visual disturbance.  Respiratory: Positive for shortness of breath. Negative for cough and wheezing.   Gastrointestinal: Negative for abdominal pain and abdominal distention.  Genitourinary: Negative for dysuria, frequency and menstrual problem.  Musculoskeletal: Negative for myalgias, joint swelling and arthralgias.  Skin: Negative for rash and wound.  Neurological: Positive for weakness and light-headedness. Negative for dizziness and headaches.  Hematological: Negative for adenopathy. Does not bruise/bleed easily.  Psychiatric/Behavioral: Negative for sleep disturbance and decreased concentration.   Past Medical History  Diagnosis Date  . Menopause   . Meniere disease   . Hyperlipidemia   . Arthritis   . GERD (gastroesophageal reflux disease)     History   Social History  . Marital Status: Married    Spouse Name: N/A    Number of Children: N/A  . Years of Education: N/A   Occupational History  . Not on file.   Social History Main Topics  . Smoking status: Never Smoker   . Smokeless tobacco: Never Used  . Alcohol Use: No  . Drug Use: No  . Sexual Activity: Yes   Other Topics Concern  . Not on file   Social History Narrative  . No narrative on file    History reviewed. No pertinent past surgical history.  Family History  Problem Relation Age of Onset  . Heart disease    . Cancer Father     melanoma    Allergies  Allergen Reactions  . Erythromycin    . Sulfonamide Derivatives     Current Outpatient Prescriptions on File Prior to Visit  Medication Sig Dispense Refill  . aspirin 81 MG tablet Take 81 mg by mouth daily.      . Biotin 1000 MCG tablet Take 1,000 mcg by mouth 3 (three) times daily.       Marland Kitchen buPROPion (WELLBUTRIN XL) 150 MG 24 hr tablet Take 1 tablet (150 mg total) by mouth every morning.  90 tablet  3  . Calcium Carbonate-Vit D-Min (CALCIUM 1200 PO) Take by mouth daily.        . diazepam (VALIUM) 5 MG tablet Take 1 tablet (5 mg total) by mouth every 12 (twelve) hours as needed.  180 tablet  1  . fenofibrate 160 MG tablet TAKE 1 TABLET DAILY  90 tablet  3  . fish oil-omega-3 fatty acids 1000 MG capsule Take 2 g by mouth every other day.       . montelukast (SINGULAIR) 10 MG tablet TAKE 1 TABLET AT BEDTIME  90 tablet  49  . Multiple Vitamin (MULTIVITAMIN) tablet Take 1 tablet by mouth daily.      . polyethylene glycol powder (MIRALAX) powder Take 17 g by mouth 2 (two) times a week.        . ranitidine (ZANTAC) 150 MG tablet TAKE 1 TABLET TWICE A DAY  180 tablet  3  . triamterene-hydrochlorothiazide (DYAZIDE) 37.5-25 MG per capsule TAKE 1 CAPSULE DAILY  90 capsule  2  . [  DISCONTINUED] cetirizine (ZYRTEC) 10 MG tablet Take 10 mg by mouth daily.         No current facility-administered medications on file prior to visit.    BP 110/72  Pulse 72  Temp(Src) 98.6 F (37 C)  Resp 16  Ht 5\' 2"  (1.575 m)  Wt 156 lb (70.761 kg)  BMI 28.53 kg/m2       Objective:   Physical Exam  Nursing note and vitals reviewed. Constitutional: She is oriented to person, place, and time. She appears well-developed and well-nourished. No distress.  HENT:  Head: Normocephalic and atraumatic.  Eyes: Conjunctivae and EOM are normal. Pupils are equal, round, and reactive to light.  Neck: Normal range of motion. Neck supple. No JVD present. No tracheal deviation present. No thyromegaly present.  Cardiovascular: Normal rate, regular rhythm and  intact distal pulses.   Murmur heard. Pulmonary/Chest: Effort normal and breath sounds normal. She has no wheezes. She exhibits no tenderness.  Abdominal: Soft. Bowel sounds are normal.  Musculoskeletal: Normal range of motion. She exhibits no edema and no tenderness.  Lymphadenopathy:    She has no cervical adenopathy.  Neurological: She is alert and oriented to person, place, and time. She has normal reflexes. No cranial nerve deficit.  Skin: Skin is warm and dry. She is not diaphoretic.  Psychiatric: She has a normal mood and affect. Her behavior is normal.          Assessment & Plan:  Increased the Wellbutrin to twice a day Stable on Dyazide for Mnire's.  Monitoring for alternative causes of fatigue including a thyroid panel a CBC of the neck to make sure the renals insufficiency has not worsened.

## 2012-09-24 NOTE — Patient Instructions (Signed)
The patient is instructed to continue all medications as prescribed. Schedule followup with check out clerk upon leaving the clinic  

## 2012-09-27 ENCOUNTER — Encounter: Payer: Self-pay | Admitting: Internal Medicine

## 2012-11-22 ENCOUNTER — Other Ambulatory Visit: Payer: Self-pay

## 2012-12-03 ENCOUNTER — Other Ambulatory Visit (INDEPENDENT_AMBULATORY_CARE_PROVIDER_SITE_OTHER): Payer: BC Managed Care – PPO

## 2012-12-03 DIAGNOSIS — Z0000 Encounter for general adult medical examination without abnormal findings: Secondary | ICD-10-CM

## 2012-12-03 LAB — LIPID PANEL
HDL: 63.2 mg/dL (ref >39.00)
LDL Cholesterol: 84 mg/dL (ref 0–99)
Total CHOL/HDL Ratio: 3
VLDL: 13 mg/dL (ref 0.0–40.0)

## 2012-12-03 LAB — POCT URINALYSIS DIPSTICK
Bilirubin, UA: NEGATIVE
Blood, UA: NEGATIVE
Ketones, UA: NEGATIVE
Protein, UA: NEGATIVE
Spec Grav, UA: 1.025
pH, UA: 6.5

## 2012-12-03 LAB — BASIC METABOLIC PANEL
BUN: 16 mg/dL (ref 6–23)
CO2: 29 mEq/L (ref 19–32)
Calcium: 9.7 mg/dL (ref 8.4–10.5)
GFR: 53.81 mL/min — ABNORMAL LOW (ref >60.00)
Glucose, Bld: 92 mg/dL (ref 70–99)
Potassium: 3.6 mEq/L (ref 3.5–5.1)
Sodium: 142 mEq/L (ref 135–145)

## 2012-12-03 LAB — CBC WITH DIFFERENTIAL/PLATELET
Basophils Absolute: 0 10*3/uL (ref 0.0–0.1)
Eosinophils Absolute: 0.2 10*3/uL (ref 0.0–0.7)
HCT: 37.9 % (ref 36.0–46.0)
Hemoglobin: 12.7 g/dL (ref 12.0–15.0)
Lymphs Abs: 1.6 10*3/uL (ref 0.7–4.0)
MCHC: 33.4 g/dL (ref 30.0–36.0)
Monocytes Relative: 9 % (ref 3.0–12.0)
Neutro Abs: 1.2 10*3/uL — ABNORMAL LOW (ref 1.4–7.7)
Platelets: 254 10*3/uL (ref 150.0–400.0)
RDW: 13.3 % (ref 11.5–14.6)

## 2012-12-03 LAB — HEPATIC FUNCTION PANEL
ALT: 20 U/L (ref 0–35)
AST: 25 U/L (ref 0–37)
Alkaline Phosphatase: 41 U/L (ref 39–117)
Bilirubin, Direct: 0.1 mg/dL (ref 0.0–0.3)
Total Bilirubin: 0.5 mg/dL (ref 0.3–1.2)
Total Protein: 6.9 g/dL (ref 6.0–8.3)

## 2012-12-05 ENCOUNTER — Other Ambulatory Visit: Payer: BC Managed Care – PPO

## 2012-12-07 ENCOUNTER — Encounter: Payer: Self-pay | Admitting: Internal Medicine

## 2012-12-12 ENCOUNTER — Ambulatory Visit (INDEPENDENT_AMBULATORY_CARE_PROVIDER_SITE_OTHER): Payer: BC Managed Care – PPO | Admitting: Internal Medicine

## 2012-12-12 ENCOUNTER — Encounter: Payer: Self-pay | Admitting: Internal Medicine

## 2012-12-12 VITALS — BP 120/78 | HR 72 | Temp 98.2°F | Resp 16 | Ht 62.0 in | Wt 156.0 lb

## 2012-12-12 DIAGNOSIS — Z23 Encounter for immunization: Secondary | ICD-10-CM

## 2012-12-12 MED ORDER — DESVENLAFAXINE SUCCINATE ER 50 MG PO TB24: 50.0000 mg | ORAL_TABLET | Freq: Every day | ORAL | Status: DC

## 2012-12-12 NOTE — Patient Instructions (Signed)
The patient is instructed to continue all medications as prescribed. Schedule followup with check out clerk upon leaving the clinic  

## 2012-12-12 NOTE — Progress Notes (Signed)
Pre visit review using our clinic review tool, if applicable. No additional management support is needed unless otherwise documented below in the visit note. 

## 2012-12-12 NOTE — Progress Notes (Signed)
Subjective:    Patient ID: Kristin Bonilla, female    DOB: March 20, 1950, 62 y.o.   MRN: [id number]  HPI CPX  .increased mood issues Review of Systems  Constitutional: Negative for activity change, appetite change and fatigue.  HENT: Negative for congestion, ear pain, postnasal drip and sinus pressure.   Eyes: Negative for redness and visual disturbance.  Respiratory: Negative for cough, shortness of breath and wheezing.   Gastrointestinal: Negative for abdominal pain and abdominal distention.  Genitourinary: Negative for dysuria, frequency and menstrual problem.  Musculoskeletal: Negative for arthralgias, joint swelling, myalgias and neck pain.  Skin: Negative for rash and wound.  Neurological: Negative for dizziness, weakness and headaches.  Hematological: Negative for adenopathy. Does not bruise/bleed easily.  Psychiatric/Behavioral: Positive for dysphoric mood. Negative for sleep disturbance and decreased concentration.   Past Medical History  Diagnosis Date  . Menopause   . Meniere disease   . Hyperlipidemia   . Arthritis   . GERD (gastroesophageal reflux disease)     History   Social History  . Marital Status: Married    Spouse Name: N/A    Number of Children: N/A  . Years of Education: N/A   Occupational History  . Not on file.   Social History Main Topics  . Smoking status: Never Smoker   . Smokeless tobacco: Never Used  . Alcohol Use: No  . Drug Use: No  . Sexual Activity: Yes   Other Topics Concern  . Not on file   Social History Narrative  . No narrative on file    No past surgical history on file.  Family History  Problem Relation Age of Onset  . Heart disease    . Cancer Father     melanoma    Allergies  Allergen Reactions  . Erythromycin   . Sulfonamide Derivatives     Current Outpatient Prescriptions on File Prior to Visit  Medication Sig Dispense Refill  . aspirin 81 MG tablet Take 81 mg by mouth daily.      . Biotin 1000 MCG  tablet Take 1,000 mcg by mouth 3 (three) times daily.       Marland Kitchen buPROPion (WELLBUTRIN XL) 150 MG 24 hr tablet Take 1 tablet (150 mg total) by mouth every morning.  90 tablet  3  . Calcium Carbonate-Vit D-Min (CALCIUM 1200 PO) Take by mouth daily.        . diazepam (VALIUM) 5 MG tablet Take 1 tablet (5 mg total) by mouth every 12 (twelve) hours as needed.  180 tablet  1  . fenofibrate 160 MG tablet TAKE 1 TABLET DAILY  90 tablet  3  . fish oil-omega-3 fatty acids 1000 MG capsule Take 2 g by mouth every other day.       . montelukast (SINGULAIR) 10 MG tablet TAKE 1 TABLET AT BEDTIME  90 tablet  49  . Multiple Vitamin (MULTIVITAMIN) tablet Take 1 tablet by mouth daily.      . polyethylene glycol powder (MIRALAX) powder Take 17 g by mouth 2 (two) times a week.        . ranitidine (ZANTAC) 150 MG tablet TAKE 1 TABLET TWICE A DAY  180 tablet  3  . triamterene-hydrochlorothiazide (DYAZIDE) 37.5-25 MG per capsule TAKE 1 CAPSULE DAILY  90 capsule  2  . [DISCONTINUED] cetirizine (ZYRTEC) 10 MG tablet Take 10 mg by mouth daily.         No current facility-administered medications on file prior to visit.  BP 120/78  Pulse 72  Temp(Src) 98.2 F (36.8 C)  Resp 16  Ht 5\' 2"  (1.575 m)  Wt 156 lb (70.761 kg)  BMI 28.53 kg/m2       Objective:   Physical Exam  Nursing note and vitals reviewed. Constitutional: She is oriented to person, place, and time. She appears well-developed and well-nourished. No distress.  HENT:  Head: Normocephalic and atraumatic.  Eyes: Conjunctivae and EOM are normal. Pupils are equal, round, and reactive to light.  Neck: Normal range of motion. Neck supple. No JVD present. No tracheal deviation present. No thyromegaly present.  Cardiovascular: Normal rate and regular rhythm.   No murmur heard. Pulmonary/Chest: Effort normal and breath sounds normal. She has no wheezes. She exhibits no tenderness.  Abdominal: Soft. Bowel sounds are normal.  Musculoskeletal: Normal range  of motion. She exhibits no edema and no tenderness.  Lymphadenopathy:    She has no cervical adenopathy.  Neurological: She is alert and oriented to person, place, and time. She has normal reflexes. No cranial nerve deficit.  Skin: Skin is warm and dry. She is not diaphoretic.  Psychiatric: She has a normal mood and affect. Her behavior is normal.          Assessment & Plan:  Pristiq worked best The welbutrin is not helping as well  This is a routine physical examination for this healthy  Female. Reviewed all health maintenance protocols including mammography colonoscopy bone density and reviewed appropriate screening labs. Her immunization history was reviewed as well as her current medications and allergies refills of her chronic medications were given and the plan for yearly health maintenance was discussed all orders and referrals were made as appropriate.

## 2013-02-11 ENCOUNTER — Other Ambulatory Visit: Payer: Self-pay | Admitting: Obstetrics and Gynecology

## 2013-02-16 ENCOUNTER — Encounter: Payer: Self-pay | Admitting: Internal Medicine

## 2013-02-18 ENCOUNTER — Other Ambulatory Visit: Payer: Self-pay | Admitting: *Deleted

## 2013-02-18 MED ORDER — TRIAMTERENE-HCTZ 37.5-25 MG PO CAPS: ORAL_CAPSULE | ORAL | Status: DC

## 2013-02-18 MED ORDER — DIAZEPAM 5 MG PO TABS: 5.0000 mg | ORAL_TABLET | Freq: Two times a day (BID) | ORAL | Status: DC | PRN

## 2013-04-03 ENCOUNTER — Encounter: Payer: Self-pay | Admitting: Internal Medicine

## 2013-04-03 ENCOUNTER — Ambulatory Visit (INDEPENDENT_AMBULATORY_CARE_PROVIDER_SITE_OTHER): Payer: BC Managed Care – PPO | Admitting: Internal Medicine

## 2013-04-03 ENCOUNTER — Telehealth: Payer: Self-pay | Admitting: Internal Medicine

## 2013-04-03 VITALS — BP 120/88 | HR 72 | Temp 98.0°F | Wt 155.0 lb

## 2013-04-03 DIAGNOSIS — T887XXA Unspecified adverse effect of drug or medicament, initial encounter: Secondary | ICD-10-CM

## 2013-04-03 DIAGNOSIS — J45909 Unspecified asthma, uncomplicated: Secondary | ICD-10-CM

## 2013-04-03 MED ORDER — DESVENLAFAXINE SUCCINATE ER 50 MG PO TB24: 50.0000 mg | ORAL_TABLET | Freq: Every day | ORAL | Status: DC

## 2013-04-03 NOTE — Progress Notes (Signed)
Subjective:    Patient ID: Kristin Bonilla, female    DOB: Jul 12, 1950, 63 y.o.   MRN: [id number]  HPI pristiq stable working well Mood is very stable "i feel like me" Stable asthma     Review of Systems  Constitutional: Negative for activity change, appetite change and fatigue.  HENT: Negative for congestion, ear pain, postnasal drip and sinus pressure.   Eyes: Negative for redness and visual disturbance.  Respiratory: Negative for cough, shortness of breath and wheezing.   Gastrointestinal: Negative for abdominal pain and abdominal distention.  Genitourinary: Negative for dysuria, frequency and menstrual problem.  Musculoskeletal: Negative for arthralgias, joint swelling, myalgias and neck pain.  Skin: Negative for rash and wound.  Neurological: Negative for dizziness, weakness and headaches.  Hematological: Negative for adenopathy. Does not bruise/bleed easily.  Psychiatric/Behavioral: Negative for sleep disturbance and decreased concentration.   Past Medical History  Diagnosis Date  . Menopause   . Meniere disease   . Hyperlipidemia   . Arthritis   . GERD (gastroesophageal reflux disease)     History   Social History  . Marital Status: Married    Spouse Name: N/A    Number of Children: N/A  . Years of Education: N/A   Occupational History  . Not on file.   Social History Main Topics  . Smoking status: Never Smoker   . Smokeless tobacco: Never Used  . Alcohol Use: No  . Drug Use: No  . Sexual Activity: Yes   Other Topics Concern  . Not on file   Social History Narrative  . No narrative on file    No past surgical history on file.  Family History  Problem Relation Age of Onset  . Heart disease    . Cancer Father     melanoma    Allergies  Allergen Reactions  . Erythromycin   . Sulfonamide Derivatives     Current Outpatient Prescriptions on File Prior to Visit  Medication Sig Dispense Refill  . aspirin 81 MG tablet Take 81 mg by mouth  daily.      . Biotin 1000 MCG tablet Take 1,000 mcg by mouth 3 (three) times daily.       . Calcium Carbonate-Vit D-Min (CALCIUM 1200 PO) Take by mouth daily.        Marland Kitchen desvenlafaxine (PRISTIQ) 50 MG 24 hr tablet Take 1 tablet (50 mg total) by mouth daily.    3  . diazepam (VALIUM) 5 MG tablet Take 1 tablet (5 mg total) by mouth every 12 (twelve) hours as needed.  180 tablet  1  . fenofibrate 160 MG tablet TAKE 1 TABLET DAILY  90 tablet  3  . fish oil-omega-3 fatty acids 1000 MG capsule Take 2 g by mouth every other day.       . montelukast (SINGULAIR) 10 MG tablet TAKE 1 TABLET AT BEDTIME  90 tablet  49  . Multiple Vitamin (MULTIVITAMIN) tablet Take 1 tablet by mouth daily.      . polyethylene glycol powder (MIRALAX) powder Take 17 g by mouth 2 (two) times a week.        . ranitidine (ZANTAC) 150 MG tablet TAKE 1 TABLET TWICE A DAY  180 tablet  3  . triamterene-hydrochlorothiazide (DYAZIDE) 37.5-25 MG per capsule TAKE 1 CAPSULE DAILY  90 capsule  3  . [DISCONTINUED] cetirizine (ZYRTEC) 10 MG tablet Take 10 mg by mouth daily.         No current facility-administered medications on  file prior to visit.    BP 120/88  Pulse 72  Temp(Src) 98 F (36.7 C) (Oral)  Wt 155 lb (70.308 kg)       Objective:   Physical Exam  Nursing note and vitals reviewed. Constitutional: She is oriented to person, place, and time. She appears well-developed and well-nourished. No distress.  HENT:  Head: Normocephalic and atraumatic.  Eyes: Conjunctivae and EOM are normal. Pupils are equal, round, and reactive to light.  Neck: Normal range of motion. Neck supple. No JVD present. No tracheal deviation present. No thyromegaly present.  Cardiovascular: Normal rate and regular rhythm.   No murmur heard. Pulmonary/Chest: Effort normal and breath sounds normal. She has no wheezes. She exhibits no tenderness.  Abdominal: Soft. Bowel sounds are normal.  Musculoskeletal: Normal range of motion. She exhibits no edema  and no tenderness.  Lymphadenopathy:    She has no cervical adenopathy.  Neurological: She is alert and oriented to person, place, and time. She has normal reflexes. No cranial nerve deficit.  Skin: Skin is warm and dry. She is not diaphoretic.  Psychiatric: She has a normal mood and affect. Her behavior is normal.          Assessment & Plan:  Lipid  Management asthama stable Mood on pristiq GERD stable reviewed immunizations

## 2013-04-03 NOTE — Progress Notes (Signed)
Pre visit review using our clinic review tool, if applicable. No additional management support is needed unless otherwise documented below in the visit note. 

## 2013-04-03 NOTE — Telephone Encounter (Signed)
Pt states dr Arnoldo Morale sent this for her this am, but [facility] has no record , an you resend desvenlafaxine (PRISTIQ) 50 MG 24 hr tablet To [facility] on [address]? Thanks!

## 2013-04-03 NOTE — Patient Instructions (Signed)
The patient is instructed to continue all medications as prescribed. Schedule followup with check out clerk upon leaving the clinic  

## 2013-04-04 MED ORDER — DESVENLAFAXINE SUCCINATE ER 50 MG PO TB24: 50.0000 mg | ORAL_TABLET | Freq: Every day | ORAL | Status: DC

## 2013-04-04 NOTE — Telephone Encounter (Signed)
rx sent in electronically 

## 2013-06-03 ENCOUNTER — Encounter: Payer: Self-pay | Admitting: Internal Medicine

## 2013-06-03 DIAGNOSIS — E785 Hyperlipidemia, unspecified: Secondary | ICD-10-CM

## 2013-06-03 MED ORDER — DIAZEPAM 5 MG PO TABS: 5.0000 mg | ORAL_TABLET | Freq: Two times a day (BID) | ORAL | Status: DC | PRN

## 2013-06-12 ENCOUNTER — Other Ambulatory Visit: Payer: Self-pay | Admitting: Internal Medicine

## 2013-06-27 ENCOUNTER — Other Ambulatory Visit: Payer: Self-pay | Admitting: Internal Medicine

## 2013-07-22 ENCOUNTER — Other Ambulatory Visit: Payer: Self-pay | Admitting: Internal Medicine

## 2013-10-28 ENCOUNTER — Telehealth: Payer: Self-pay | Admitting: Internal Medicine

## 2013-10-28 MED ORDER — DIAZEPAM 5 MG PO TABS: 5.0000 mg | ORAL_TABLET | Freq: Two times a day (BID) | ORAL | Status: DC | PRN

## 2013-10-28 NOTE — Telephone Encounter (Signed)
Pt request refill of the following: diazepam (VALIUM) 5 MG tablet  Pt said it is a 90 day supply    Phamacy:  [facility]

## 2013-10-28 NOTE — Telephone Encounter (Signed)
rx faxed to pharmacy

## 2013-10-29 ENCOUNTER — Ambulatory Visit (INDEPENDENT_AMBULATORY_CARE_PROVIDER_SITE_OTHER): Payer: BC Managed Care – PPO

## 2013-10-29 DIAGNOSIS — Z23 Encounter for immunization: Secondary | ICD-10-CM

## 2013-11-22 ENCOUNTER — Encounter: Payer: Self-pay | Admitting: Internal Medicine

## 2013-12-09 ENCOUNTER — Other Ambulatory Visit: Payer: BC Managed Care – PPO

## 2013-12-09 ENCOUNTER — Telehealth: Payer: Self-pay | Admitting: Internal Medicine

## 2013-12-09 MED ORDER — AMOXICILLIN-POT CLAVULANATE 875-125 MG PO TABS: 1.0000 | ORAL_TABLET | Freq: Two times a day (BID) | ORAL | Status: DC

## 2013-12-09 NOTE — Telephone Encounter (Signed)
Patient Information:  Caller Name: Takyah  Phone: [phone]  Patient: Kristin Bonilla, Kristin Bonilla  Gender: Female  DOB: 1950/05/10  Age: 63 Years  PCP: Garret Reddish  Office Follow Up:  Does the office need to follow up with this patient?: Yes  Instructions For The Office: No appts today. Please call pt back. work-in? UC? appt tomorrow? or any other recommendations/instructions?  RN Note:  No appts available today. Assured pt will send message for review and someone will call her back shortly to f/u with MD recommendations/instructions. Agreed to plan.  Symptoms  Reason For Call & Symptoms: Sinus congestion/pain/pressure x at least 3 days. Also has Meniere's disease and states she has had "roaring" sound in her head for last few days and "white noise" sound in her ear as well. States this is worse and lasting longer than her typical intermittent Meniere's sxs. Also having intermittent earache which is not usual for her and nasal drainage. Does not think she has a fever but has also been taking Tylenol regularly for last few days for headaches. Pt would like to be seen and is really hoping to see some improvement in sxs prior to this Thursday (Thanksgiving).  Reviewed Health History In EMR: Yes  Reviewed Medications In EMR: Yes  Reviewed Allergies In EMR: Yes  Reviewed Surgeries / Procedures: Yes  Date of Onset of Symptoms: 12/07/2013  Treatments Tried: Tylenol  Treatments Tried Worked: Yes  Guideline(s) Used:  Sinus Pain and Congestion  Disposition Per Guideline:   See Today in Office  Reason For Disposition Reached:   Earache  Advice Given:  For a Stuffy Nose - Use Nasal Washes:  Introduction: Saline (salt water) nasal irrigation (nasal wash) is an effective and simple home remedy for treating stuffy nose and sinus congestion. The nose can be irrigated by pouring, spraying, or squirting salt water into the nose and then letting it run back out.  How it Helps: The salt water rinses  out excess mucus, washes out any irritants (dust, allergens) that might be present, and moistens the nasal cavity.  Methods: There are several ways to perform nasal irrigation. You can use a saline nasal spray bottle (available over-the-counter), a rubber ear syringe, a medical syringe without the needle, or a Neti Pot.  Pain and Fever Medicines:  For pain or fever relief, take either acetaminophen or ibuprofen.  Extra Notes :  Use the lowest amount of medicine that makes your fever better.  Hydration:  Drink plenty of liquids (6-8 glasses of water daily). If the air in your home is dry, use a cool mist humidifier  Call Back If:   You become worse.  Patient Will Follow Care Advice:  YES

## 2013-12-09 NOTE — Telephone Encounter (Signed)
Please Advise

## 2013-12-09 NOTE — Telephone Encounter (Signed)
FYI-Keba please let me know if there is a question from call a nurse verbally as well as sending message especially if response requested that day. Nothing else needs to be done for this.   Patient symptoms since last Tuesday primarily with sinus pressure, nasal discharge. Symptoms started getting better around Friday and then worsened over the weekend. We will call this a bacterial sinusitis given improvement and then worsening. Augmentin 7 days. Given concurrent Mnire's disease history I suggested patient follow up with ENT if symptoms worsen or fail to improve.

## 2013-12-10 MED ORDER — AMOXICILLIN-POT CLAVULANATE 875-125 MG PO TABS: 1.0000 | ORAL_TABLET | Freq: Two times a day (BID) | ORAL | Status: DC

## 2013-12-10 NOTE — Addendum Note (Signed)
Addended by: Clyde Lundborg A on: 12/10/2013 10:07 AM   Modules accepted: Orders

## 2013-12-10 NOTE — Telephone Encounter (Signed)
Medication sent in to [facility]

## 2013-12-10 NOTE — Telephone Encounter (Signed)
Patient called regarding amoxicillin-clavulanate (AUGMENTIN) 875-125 MG per tablet Rx.  [facility] but patient needed it to go to [facility] [place], [place] [address]

## 2013-12-10 NOTE — Telephone Encounter (Signed)
Patient called back stating the [facility] told her they couldn't run the below RX through her insurance until the one sent to [facility] was cancelled.  Can you please cancel the RX sent to [facility] so patient can get rx from [facility]??

## 2013-12-10 NOTE — Telephone Encounter (Signed)
Patient is aware 

## 2013-12-10 NOTE — Telephone Encounter (Signed)
Rx cancelled from [facility]

## 2013-12-16 ENCOUNTER — Encounter: Payer: BC Managed Care – PPO | Admitting: Internal Medicine

## 2013-12-20 ENCOUNTER — Ambulatory Visit (INDEPENDENT_AMBULATORY_CARE_PROVIDER_SITE_OTHER): Payer: BC Managed Care – PPO | Admitting: Family Medicine

## 2013-12-20 ENCOUNTER — Encounter: Payer: Self-pay | Admitting: Family Medicine

## 2013-12-20 VITALS — BP 112/82 | HR 72 | Temp 98.1°F | Wt 163.0 lb

## 2013-12-20 DIAGNOSIS — N289 Disorder of kidney and ureter, unspecified: Secondary | ICD-10-CM

## 2013-12-20 DIAGNOSIS — H8109 Meniere's disease, unspecified ear: Secondary | ICD-10-CM | POA: Insufficient documentation

## 2013-12-20 DIAGNOSIS — N183 Chronic kidney disease, stage 3 unspecified: Secondary | ICD-10-CM | POA: Insufficient documentation

## 2013-12-20 DIAGNOSIS — H8101 Meniere's disease, right ear: Secondary | ICD-10-CM

## 2013-12-20 MED ORDER — AZITHROMYCIN 250 MG PO TABS: ORAL_TABLET | ORAL | Status: DC

## 2013-12-20 NOTE — Progress Notes (Addendum)
Kristin Reddish, MD Phone: [phone]  Subjective:  Patient presents today to establish care with me as their new primary care provider. Patient was formerly a patient of Kristin Bonilla. Chief complaint-noted.   Meniere's disease/congestion/ear roaring/ear discomfort/bacterial sinusitis Before thanksgiving patient reported sinus pressure, nasal discharge which improved then worsened concerning for bacterial sinusitis. Augmentin 7 days prescribed as no availability on Thanksgiving day. Given concurrent Mnire's disease history I suggested patient follow up with ENT if symptoms worsen or fail to improve. Patient presents today with lack of improvement but would prefer not to go to ENT-she has been stbale for some time on regimen started by Kristin Bonilla and usually antibiotic course calms things down in flares when she has sinus issues. Current issue is a "roaring" sound in her right ear-side with already decreased hearing loss. She is only taking triamterene-hctz every other day.  ROS- complains of tinnitis, hearing loss not clear if worsening or just chronic. Low grade temperatures/chills.   CKD Stage III New diagnosis. GFR 49-55 over several years.Not on ace/arb just triamteene. Not nsaid user.  ROS- normal urination pattern, no confusion.   The following were reviewed and entered/updated in epic: Past Medical History  Diagnosis Date  . Menopause   . Meniere disease   . Hyperlipidemia   . Arthritis   . GERD (gastroesophageal reflux disease)   . 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 CKD (chronic kidney disease), stage III 12/20/2013    Priority: Medium  . Meniere disease 12/20/2013    Priority: Medium  . Depression 06/07/2007    Priority: Medium  . Hyperlipidemia 10/25/2006    Priority: Medium  . Alopecia 04/27/2009    Priority: Low  . GERD (gastroesophageal reflux disease) 01/09/2008    Priority: Low  . Irritable  bowel syndrome 01/09/2008    Priority: Low   No past surgical history on file.  Family History  Problem Relation Age of Onset  . Heart disease      died 37, around age 42-CABG, smoker  . Cancer Father     melanoma    Medications- reviewed and updated Current Outpatient Prescriptions  Medication Sig Dispense Refill  . aspirin 81 MG tablet Take 81 mg by mouth daily.    . Biotin 1000 MCG tablet Take 1,000 mcg by mouth 3 (three) times daily.     . Calcium Carbonate-Vit D-Min (CALCIUM 1200 PO) Take by mouth daily.      Marland Kitchen desvenlafaxine (PRISTIQ) 50 MG 24 hr tablet Take 1 tablet (50 mg total) by mouth daily. 30 tablet 11  . fenofibrate 160 MG tablet TAKE 1 TABLET DAILY 90 tablet 2  . fish oil-omega-3 fatty acids 1000 MG capsule Take 2 g by mouth every other day.     . levocetirizine (XYZAL) 5 MG tablet TAKE 1 TABLET IN THE EVENING 90 tablet 2  . montelukast (SINGULAIR) 10 MG tablet TAKE 1 TABLET AT BEDTIME 90 tablet 48  . Multiple Vitamin (MULTIVITAMIN) tablet Take 1 tablet by mouth daily.    . polyethylene glycol powder (MIRALAX) powder Take 17 g by mouth 2 (two) times a week.      . ranitidine (ZANTAC) 150 MG tablet TAKE 1 TABLET TWICE A DAY 180 tablet 2  . triamterene-hydrochlorothiazide (DYAZIDE) 37.5-25 MG per capsule TAKE 1 CAPSULE DAILY 90 capsule 3  . azithromycin (ZITHROMAX) 250 MG tablet Take 2 pills day 1, then 1 pill per day until finished 6 tablet 0  .  diazepam (VALIUM) 5 MG tablet Take 1 tablet (5 mg total) by mouth every 12 (twelve) hours as needed. (Patient not taking: Reported on 12/20/2013) 180 tablet 0  . [DISCONTINUED] cetirizine (ZYRTEC) 10 MG tablet Take 10 mg by mouth daily.       No current facility-administered medications for this visit.    Allergies-reviewed and updated Allergies  Allergen Reactions  . Erythromycin   . Sulfonamide Derivatives     History   Social History  . Marital Status: Married    Spouse Name: N/A    Number of Children: N/A  .  Years of Education: N/A   Social History Main Topics  . Smoking status: Never Smoker   . Smokeless tobacco: Never Used  . Alcohol Use: 0.0 oz/week    0 Not specified per week     Comment: wine every few months  . Drug Use: No  . Sexual Activity: Yes   Other Topics Concern  . None   Social History Narrative   Married. 2 children (son is special needs-in group home living). 1 grandchild      Retired from [employer] different grades      Hobbies: reading, ipad, former bowling a lot       ROS--See HPI   Objective: BP 112/82 mmHg  Pulse 72  Temp(Src) 98.1 F (36.7 C)  Wt 163 lb (73.936 kg) Gen: NAD, resting comfortably HEENT: Mucous membranes are moist. Pharynx with erythema and signs of drainage. TM normal. Turbinates swollen and erythematous. Sinus pressure noted in maxillary sinus CV: RRR no murmurs rubs or gallops Lungs: CTAB no crackles, wheeze, rhonchi Abdomen: soft/nontender/nondistended/normal bowel sounds.  Ext: no edema Skin: warm, dry, no rash  Neuro: grossly normal, moves all extremities, PERRLA  Assessment/Plan:  Meniere disease Bacterial sinusitis Chronic problem. Severe right sided hearing loss. Stablized by Kristin Bonilla on-singulair, xyzal, triamterene-hctz every other day, valium 5mg  BID. Patient still with tinnitus but when severe hears "roaring". Worse with congestion issues. Patient sounded to have sinus infection before thanksgiving and we trialed augmentin with no relief-this was treated due to the "roaring" sound and worsening ear discomfort. Will try another course of z-pack given persistence of sinus pressure symptoms and potential bacterial sinusitis per patient preference. I told her my preference would be to send to ENT but she is willing to go to ENT if azithromycin not effective. I talked with patient that I thought hctz should be used daily perhaps 12.5 mg and we need to consider ace/arb for ckd instead of triamterine-she is going  to callin what she is currently taking.   CKD (chronic kidney disease), stage III Patient to call in current meniere treatment. Discussed with patient i would want her on ace/arb and if she is not we will need to change rx to this and off triamterene and consider daily hctz for meniere at 12.5 mg. Update bmet today.   Return precautions advised.   Orders Placed This Encounter  Procedures  . Basic metabolic panel    [place]   Meds ordered this encounter  Medications  . azithromycin (ZITHROMAX) 250 MG tablet    Sig: Take 2 pills day 1, then 1 pill per day until finished    Dispense:  6 tablet    Refill:  0

## 2013-12-20 NOTE — Assessment & Plan Note (Signed)
Patient to call in current meniere treatment. Discussed with patient i would want her on ace/arb and if she is not we will need to change rx to this and off triamterene and consider daily hctz for meniere at 12.5 mg.

## 2013-12-20 NOTE — Assessment & Plan Note (Addendum)
Chronic problem. Severe right sided hearing loss. Stablized by [name] on-singulair, xyzal, triamterene-hctz every other day, valium 5mg  BID. Patient still with tinnitus but when severe hears "roaring". Worse with congestion issues. Patient sounded to have sinus infection before thanksgiving and we trialed augmentin with no relief-this was treated due to the "roaring" sound and worsening ear discomfort. Will try another course of z-pack given persistence per patient preference. I told her my preference would be to send to ENT but she is willing to go to ENT if azithromycin not effective. I talked with patient that I thought hctz should be used daily perhaps 12.5 mg and we need to consider ace/arb for ckd instead of triamterine-she is going to callin what she is currently taking.

## 2013-12-20 NOTE — Patient Instructions (Signed)
Check kidney function status today  You will call me with your diuretic medicine (taking every other day)  Z-pack and if does not calm things down-mychart message next week and we will refer to ENT.

## 2013-12-21 ENCOUNTER — Encounter: Payer: Self-pay | Admitting: Family Medicine

## 2013-12-22 LAB — BASIC METABOLIC PANEL
BUN: 23 mg/dL (ref 6–23)
CALCIUM: 10.7 mg/dL — AB (ref 8.4–10.5)
CO2: 29 mEq/L (ref 19–32)
Chloride: 106 mEq/L (ref 96–112)
Creatinine, Ser: 1.2 mg/dL (ref 0.4–1.2)
GFR: 48.94 mL/min — ABNORMAL LOW (ref >60.00)
Glucose, Bld: 74 mg/dL (ref 70–99)
Potassium: 4.4 mEq/L (ref 3.5–5.1)
SODIUM: 146 meq/L — AB (ref 135–145)

## 2013-12-26 ENCOUNTER — Encounter: Payer: Self-pay | Admitting: Family Medicine

## 2014-01-27 ENCOUNTER — Ambulatory Visit: Payer: BC Managed Care – PPO | Admitting: Family Medicine

## 2014-02-13 ENCOUNTER — Encounter: Payer: Self-pay | Admitting: Family Medicine

## 2014-02-13 DIAGNOSIS — H8109 Meniere's disease, unspecified ear: Secondary | ICD-10-CM

## 2014-02-21 LAB — HM MAMMOGRAPHY: HM Mammogram: NORMAL

## 2014-02-21 LAB — HM PAP SMEAR: HM Pap smear: NEGATIVE

## 2014-03-03 ENCOUNTER — Other Ambulatory Visit: Payer: Self-pay | Admitting: Internal Medicine

## 2014-03-05 ENCOUNTER — Other Ambulatory Visit: Payer: Self-pay

## 2014-03-05 ENCOUNTER — Encounter: Payer: Self-pay | Admitting: Family Medicine

## 2014-03-05 MED ORDER — DIAZEPAM 5 MG PO TABS: 5.0000 mg | ORAL_TABLET | Freq: Two times a day (BID) | ORAL | Status: DC | PRN

## 2014-03-25 ENCOUNTER — Other Ambulatory Visit: Payer: Self-pay | Admitting: *Deleted

## 2014-03-25 MED ORDER — DESVENLAFAXINE SUCCINATE ER 50 MG PO TB24: 50.0000 mg | ORAL_TABLET | Freq: Every day | ORAL | Status: DC

## 2014-03-28 ENCOUNTER — Other Ambulatory Visit: Payer: Self-pay | Admitting: Physician Assistant

## 2014-03-31 ENCOUNTER — Encounter: Payer: Self-pay | Admitting: Family Medicine

## 2014-04-01 ENCOUNTER — Other Ambulatory Visit: Payer: Self-pay

## 2014-04-01 MED ORDER — TRIAMTERENE-HCTZ 37.5-25 MG PO CAPS: ORAL_CAPSULE | ORAL | Status: DC

## 2014-04-01 MED ORDER — FENOFIBRATE 160 MG PO TABS: 160.0000 mg | ORAL_TABLET | Freq: Every day | ORAL | Status: DC

## 2014-04-01 MED ORDER — LEVOCETIRIZINE DIHYDROCHLORIDE 5 MG PO TABS: 5.0000 mg | ORAL_TABLET | Freq: Every evening | ORAL | Status: DC

## 2014-04-02 ENCOUNTER — Other Ambulatory Visit: Payer: Self-pay | Admitting: Physician Assistant

## 2014-04-02 DIAGNOSIS — IMO0001 Reserved for inherently not codable concepts without codable children: Secondary | ICD-10-CM

## 2014-04-02 DIAGNOSIS — H9312 Tinnitus, left ear: Secondary | ICD-10-CM

## 2014-04-02 DIAGNOSIS — H918X2 Other specified hearing loss, left ear: Secondary | ICD-10-CM

## 2014-04-07 ENCOUNTER — Ambulatory Visit
Admission: RE | Admit: 2014-04-07 | Discharge: 2014-04-07 | Disposition: A | Discharge status: Home / Self Care | Payer: BC Managed Care – PPO | Source: Ambulatory Visit | Attending: Physician Assistant | Admitting: Physician Assistant

## 2014-04-07 DIAGNOSIS — H9312 Tinnitus, left ear: Secondary | ICD-10-CM

## 2014-04-07 DIAGNOSIS — IMO0001 Reserved for inherently not codable concepts without codable children: Secondary | ICD-10-CM

## 2014-04-07 DIAGNOSIS — H918X2 Other specified hearing loss, left ear: Secondary | ICD-10-CM

## 2014-04-25 ENCOUNTER — Other Ambulatory Visit: Payer: Self-pay | Admitting: *Deleted

## 2014-04-25 MED ORDER — RANITIDINE HCL 150 MG PO TABS: 150.0000 mg | ORAL_TABLET | Freq: Two times a day (BID) | ORAL | Status: DC

## 2014-05-16 ENCOUNTER — Encounter: Payer: Self-pay | Admitting: Family Medicine

## 2014-05-20 ENCOUNTER — Other Ambulatory Visit (INDEPENDENT_AMBULATORY_CARE_PROVIDER_SITE_OTHER): Payer: BC Managed Care – PPO

## 2014-05-20 DIAGNOSIS — Z0000 Encounter for general adult medical examination without abnormal findings: Secondary | ICD-10-CM | POA: Diagnosis not present

## 2014-05-20 LAB — POCT URINALYSIS DIPSTICK
BILIRUBIN UA: NEGATIVE
Blood, UA: NEGATIVE
Glucose, UA: NEGATIVE
Ketones, UA: NEGATIVE
LEUKOCYTES UA: NEGATIVE
Nitrite, UA: NEGATIVE
PROTEIN UA: NEGATIVE
SPEC GRAV UA: 1.015
Urobilinogen, UA: 0.2
pH, UA: 6.5

## 2014-05-20 LAB — CBC WITH DIFFERENTIAL/PLATELET
Basophils Absolute: 0.1 10*3/uL (ref 0.0–0.1)
Basophils Relative: 2.2 % (ref 0.0–3.0)
EOS ABS: 0.1 10*3/uL (ref 0.0–0.7)
Eosinophils Relative: 4.1 % (ref 0.0–5.0)
HCT: 36.7 % (ref 36.0–46.0)
Hemoglobin: 12.6 g/dL (ref 12.0–15.0)
Lymphocytes Relative: 48.1 % — ABNORMAL HIGH (ref 12.0–46.0)
Lymphs Abs: 1.4 10*3/uL (ref 0.7–4.0)
MCHC: 34.3 g/dL (ref 30.0–36.0)
MCV: 88.4 fl (ref 78.0–100.0)
MONO ABS: 0.3 10*3/uL (ref 0.1–1.0)
Monocytes Relative: 11.5 % (ref 3.0–12.0)
NEUTROS PCT: 34.1 % — AB (ref 43.0–77.0)
Neutro Abs: 1 10*3/uL — ABNORMAL LOW (ref 1.4–7.7)
PLATELETS: 259 10*3/uL (ref 150.0–400.0)
RBC: 4.16 Mil/uL (ref 3.87–5.11)
RDW: 13.8 % (ref 11.5–15.5)
WBC: 2.8 10*3/uL — ABNORMAL LOW (ref 4.0–10.5)

## 2014-05-20 LAB — BASIC METABOLIC PANEL
BUN: 20 mg/dL (ref 6–23)
CO2: 30 meq/L (ref 19–32)
Calcium: 10.5 mg/dL (ref 8.4–10.5)
Chloride: 107 mEq/L (ref 96–112)
Creatinine, Ser: 1.19 mg/dL (ref 0.40–1.20)
GFR: 48.4 mL/min — AB (ref >60.00)
GLUCOSE: 91 mg/dL (ref 70–99)
POTASSIUM: 4.4 meq/L (ref 3.5–5.1)
SODIUM: 142 meq/L (ref 135–145)

## 2014-05-20 LAB — HEPATIC FUNCTION PANEL
ALT: 16 U/L (ref 0–35)
AST: 22 U/L (ref 0–37)
Albumin: 4.2 g/dL (ref 3.5–5.2)
Alkaline Phosphatase: 40 U/L (ref 39–117)
BILIRUBIN DIRECT: 0.1 mg/dL (ref 0.0–0.3)
BILIRUBIN TOTAL: 0.5 mg/dL (ref 0.2–1.2)
Total Protein: 7.1 g/dL (ref 6.0–8.3)

## 2014-05-20 LAB — LIPID PANEL
CHOL/HDL RATIO: 3
Cholesterol: 192 mg/dL (ref 0–200)
HDL: 64.8 mg/dL (ref >39.00)
LDL CALC: 110 mg/dL — AB (ref 0–99)
NONHDL: 127.2
Triglycerides: 87 mg/dL (ref 0.0–149.0)
VLDL: 17.4 mg/dL (ref 0.0–40.0)

## 2014-05-20 LAB — TSH: TSH: 2.33 u[IU]/mL (ref 0.35–4.50)

## 2014-05-29 ENCOUNTER — Encounter: Payer: Self-pay | Admitting: Family Medicine

## 2014-05-29 ENCOUNTER — Ambulatory Visit (INDEPENDENT_AMBULATORY_CARE_PROVIDER_SITE_OTHER): Payer: BC Managed Care – PPO | Admitting: Family Medicine

## 2014-05-29 VITALS — BP 118/84 | HR 76 | Temp 99.2°F | Ht 62.0 in | Wt 162.0 lb

## 2014-05-29 DIAGNOSIS — E785 Hyperlipidemia, unspecified: Secondary | ICD-10-CM

## 2014-05-29 DIAGNOSIS — D72819 Decreased white blood cell count, unspecified: Secondary | ICD-10-CM | POA: Insufficient documentation

## 2014-05-29 DIAGNOSIS — H8101 Meniere's disease, right ear: Secondary | ICD-10-CM

## 2014-05-29 DIAGNOSIS — N183 Chronic kidney disease, stage 3 unspecified: Secondary | ICD-10-CM

## 2014-05-29 DIAGNOSIS — Z0000 Encounter for general adult medical examination without abnormal findings: Secondary | ICD-10-CM

## 2014-05-29 DIAGNOSIS — G4700 Insomnia, unspecified: Secondary | ICD-10-CM

## 2014-05-29 MED ORDER — TRAZODONE HCL 50 MG PO TABS: 25.0000 mg | ORAL_TABLET | Freq: Every evening | ORAL | Status: DC | PRN

## 2014-05-29 NOTE — Assessment & Plan Note (Signed)
Noted every year since 2011. WBC below 3 for first time though. We will plan on repeat CBC with diff as well as HIV before follow up appointment 6 months. Suspect some of lower #s is related to recent URI getting over. Depending on trend, may get hematology involved.

## 2014-05-29 NOTE — Assessment & Plan Note (Signed)
Trial trazodone as coming off valium used by [name] for meniere's and transition difficult. Sleeping 4 hours a night, hopeful to improve and if works can send to mail order

## 2014-05-29 NOTE — Progress Notes (Signed)
Kristin Reddish, MD Phone: [phone]  Subjective:  Patient presents today for their annual physical. Chief complaint-noted.   Insomnia meniere's Previously on valium for meniere's, off for 2-4 weeks as advised by ENT and to be used prn instead of scheduled.  Dry mouth, super sensitive, gnawing on tongue not intentionally, head racing. Mind racing when goes to bed only. Only sleeping 4 or less hours when previously slept 8 hours on valium 5mg  BID. Was not sleeping as well once got down to 2.5mg  valium.   Hyperlipidemia-mild poor control  Lab Results  Component Value Date   LDLCALC 110* 05/20/2014   On statin: no Regular exercise: just started walking recently. Knows could use some weight loss as well ROS- no chest pain or shortness of breath. No myalgias  Leukopenia- persistent mild worsening Reviewed labs and discussed need to repeat. Was dealing with URI including sore throat last week.  no fevers, chills, fatigue/malaise, nausea/vomiting, or night sweats or unintentional weight loss  ROS- full  review of systems was completed and negative except as above    The following were reviewed and entered/updated in epic: Past Medical History  Diagnosis Date  . Menopause   . Meniere disease   . Hyperlipidemia   . Arthritis   . GERD (gastroesophageal reflux disease)   . 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 Leukopenia 05/29/2014    Priority: Medium  . CKD (chronic kidney disease), stage III 12/20/2013    Priority: Medium  . Meniere disease 12/20/2013    Priority: Medium  . Depression 06/07/2007    Priority: Medium  . Hyperlipidemia 10/25/2006    Priority: Medium  . Insomnia 05/29/2014    Priority: Low  . Alopecia 04/27/2009    Priority: Low  . GERD (gastroesophageal reflux disease) 01/09/2008    Priority: Low  . Irritable bowel syndrome 01/09/2008    Priority: Low   No past surgical history on  file.  Family History  Problem Relation Age of Onset  . Heart disease      died 27, around age 35-CABG, smoker  . Cancer Father     melanoma    Medications- reviewed and updated Current Outpatient Prescriptions  Medication Sig Dispense Refill  . aspirin 81 MG tablet Take 81 mg by mouth daily.    . Biotin 1000 MCG tablet Take 1,000 mcg by mouth 3 (three) times daily.     . Calcium Carbonate-Vit D-Min (CALCIUM 1200 PO) Take by mouth daily.      Marland Kitchen desvenlafaxine (PRISTIQ) 50 MG 24 hr tablet Take 1 tablet (50 mg total) by mouth daily. 30 tablet 11  . fenofibrate 160 MG tablet Take 1 tablet (160 mg total) by mouth daily. 90 tablet 2  . fish oil-omega-3 fatty acids 1000 MG capsule Take 2 g by mouth every other day.     . levocetirizine (XYZAL) 5 MG tablet Take 1 tablet (5 mg total) by mouth every evening. 90 tablet 2  . montelukast (SINGULAIR) 10 MG tablet TAKE 1 TABLET AT BEDTIME 90 tablet 48  . Multiple Vitamin (MULTIVITAMIN) tablet Take 1 tablet by mouth daily.    . polyethylene glycol powder (MIRALAX) powder Take 17 g by mouth 2 (two) times a week.      . ranitidine (ZANTAC) 150 MG tablet Take 1 tablet (150 mg total) by mouth 2 (two) times daily. 180 tablet 1   Allergies-reviewed and updated Allergies  Allergen Reactions  . Erythromycin   .  Sulfonamide Derivatives     History   Social History  . Marital Status: Married    Spouse Name: N/A  . Number of Children: N/A  . Years of Education: N/A   Social History Main Topics  . Smoking status: Never Smoker   . Smokeless tobacco: Never Used  . Alcohol Use: 0.0 oz/week    0 Standard drinks or equivalent per week     Comment: wine every few months  . Drug Use: No  . Sexual Activity: Yes   Other Topics Concern  . None   Social History Narrative   Married. 2 children (son is special needs-in group home living). 1 grandchild      Retired from [employer] different grades      Hobbies: reading, ipad, former  bowling a lot       ROS--See HPI   Objective: BP 118/84 mmHg  Pulse 76  Temp(Src) 99.2 F (37.3 C)  Ht 5\' 2"  (1.575 m)  Wt 162 lb (73.483 kg)  BMI 29.62 kg/m2 Gen: NAD, resting comfortably HEENT: Mucous membranes are moist. Oropharynx normal. TM normal.  Neck: no thyromegaly CV: RRR no murmurs rubs or gallops Lungs: CTAB no crackles, wheeze, rhonchi Abdomen: soft/nontender/nondistended/normal bowel sounds. No rebound or guarding.  Breast and gyn exam deferred to gyn Ext: no edema Skin: warm, dry, no rash  Neuro: grossly normal, moves all extremities, PERRLA   Assessment/Plan:  63 y.o. female presenting for annual physical.  Health Maintenance counseling: 1. Anticipatory guidance: Patient counseled regarding regular dental exams, regular eye exams, wearing seatbelts, wearing sunscreen. Sees dermatology yearly with father with history melanoma. See ob/gyn still and they do pelvics and breast exams.  2. Risk factor reduction:  Advised patient of need for regular exercise (started walking recently) and diet rich and fruits and vegetables to reduce risk of heart attack and stroke.  3. Immunizations/screenings/ancillary studies Health Maintenance Due  Topic Date Due  . HIV Screening - check with next bloodwork 07/03/1965  . MAMMOGRAM  04/10/2014   Insomnia Trial trazodone as coming off valium used by [name] for meniere's and transition difficult. Sleeping 4 hours a night, hopeful to improve and if works can send to mail order   Leukopenia Noted every year since 2011. WBC below 3 for first time though. We will plan on repeat CBC with diff as well as HIV before follow up appointment 6 months. Suspect some of lower #s is related to recent URI getting over. Depending on trend, may get hematology involved.    Meniere disease Patient saw ENT who took her off  triamterene-hctz  And valium BID scheduled and changed valium to prn. She is trying to adjust to this transition and  doing reasonably well. Follow up 6 months and prn. Next step is to trial off xyzal as this was part of regimen and see if changes symptoms   Hyperlipidemia Reasonable control for Triglycerides-fenofibrate and fish oil. Could potentially trial off and calculate 10 year risk. LDL is slightly high and set weight goal to hopefully improve LDL under 100.    6 months with labs before visit Orders Placed This Encounter  Procedures  . CBC with Differential/Platelet    Standing Status: Future     Number of Occurrences:      Standing Expiration Date: 05/29/2015  . Basic metabolic panel    Standing Status: Future     Number of Occurrences:      Standing Expiration Date: 05/29/2015  . HIV antibody  [facility]    Standing Status: Future     Number of Occurrences:      Standing Expiration Date: 05/29/2015    Meds ordered this encounter  Medications  . traZODone (DESYREL) 50 MG tablet    Sig: Take 0.5-1 tablets (25-50 mg total) by mouth at bedtime as needed for sleep.    Dispense:  30 tablet    Refill:  3

## 2014-05-29 NOTE — Assessment & Plan Note (Signed)
Patient saw ENT who took her off  triamterene-hctz  And valium BID scheduled and changed valium to prn. She is trying to adjust to this transition and doing reasonably well. Follow up 6 months and prn. Next step is to trial off xyzal as this was part of regimen and see if changes symptoms

## 2014-05-29 NOTE — Patient Instructions (Addendum)
Have PAP and Mammogram records faxed to [place] at [phone].  Return visit in 6 months with labs a week before. We will check in on slightly low infection fighting cells at that time as well as recheck kidneys. I suspect some of this is the infection you are fighting off.   I would target a weight closer to 150. I love that you have started walking. Goal 150 minutes a week of this.   I think in the long run being off the triamterene-hctz and valium will be good for you. When things stabilize, you can trial coming off xyzal as next step.

## 2014-05-29 NOTE — Assessment & Plan Note (Signed)
Reasonable control for Triglycerides-fenofibrate and fish oil. Could potentially trial off and calculate 10 year risk. LDL is slightly high and set weight goal to hopefully improve LDL under 100.

## 2014-06-03 ENCOUNTER — Telehealth: Payer: Self-pay | Admitting: Family Medicine
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
## 2014-06-03 NOTE — Telephone Encounter (Signed)
See below

## 2014-06-03 NOTE — Telephone Encounter (Signed)
See below Mrs. Cheryl and schedule appt for pt per Dr. Yong Channel

## 2014-06-03 NOTE — Telephone Encounter (Signed)
Not sure what "pulse pockets" are. Regardless, would recommend an office visit. She would need strep testing to determine if antibiotics are needed.

## 2014-06-05 ENCOUNTER — Encounter: Payer: Self-pay | Admitting: *Deleted

## 2014-06-05 NOTE — Telephone Encounter (Signed)
S/w pt she said she is feeling better and is still at [facility] . Said if she had to she would go to a Walk in Clinic at [facility]

## 2014-06-06 ENCOUNTER — Encounter: Payer: Self-pay | Admitting: Family Medicine

## 2014-06-12 ENCOUNTER — Encounter: Payer: Self-pay | Admitting: Family Medicine

## 2014-06-18 ENCOUNTER — Encounter: Payer: Self-pay | Admitting: Family Medicine

## 2014-06-20 ENCOUNTER — Telehealth: Payer: Self-pay

## 2014-06-20 DIAGNOSIS — L659 Nonscarring hair loss, unspecified: Secondary | ICD-10-CM

## 2014-06-20 NOTE — Telephone Encounter (Signed)
Yes if she is on his DPR, otherwise he will have to contact us

## 2014-06-20 NOTE — Telephone Encounter (Signed)
Referral entered  

## 2014-06-30 ENCOUNTER — Telehealth: Payer: Self-pay | Admitting: Family Medicine

## 2014-06-30 NOTE — Telephone Encounter (Signed)
Pt has changed insurance companies and wants you to know she will be using [employer] and they are going to send a fax

## 2014-06-30 NOTE — Telephone Encounter (Signed)
Pharmacy updated.

## 2014-07-01 ENCOUNTER — Other Ambulatory Visit: Payer: Self-pay

## 2014-07-01 MED ORDER — MONTELUKAST SODIUM 10 MG PO TABS: 10.0000 mg | ORAL_TABLET | Freq: Every day | ORAL | Status: DC

## 2014-07-01 MED ORDER — LEVOCETIRIZINE DIHYDROCHLORIDE 5 MG PO TABS: 5.0000 mg | ORAL_TABLET | Freq: Every evening | ORAL | Status: DC

## 2014-07-01 MED ORDER — RANITIDINE HCL 150 MG PO TABS: 150.0000 mg | ORAL_TABLET | Freq: Two times a day (BID) | ORAL | Status: DC

## 2014-07-01 MED ORDER — DESVENLAFAXINE SUCCINATE ER 50 MG PO TB24: 50.0000 mg | ORAL_TABLET | Freq: Every day | ORAL | Status: DC

## 2014-07-01 MED ORDER — FENOFIBRATE 160 MG PO TABS: 160.0000 mg | ORAL_TABLET | Freq: Every day | ORAL | Status: DC

## 2014-07-01 NOTE — Telephone Encounter (Signed)
Sent refill information to pharmacy

## 2014-07-07 ENCOUNTER — Encounter: Payer: Self-pay | Admitting: Family Medicine

## 2014-07-14 ENCOUNTER — Other Ambulatory Visit: Payer: Self-pay | Admitting: *Deleted

## 2014-07-14 ENCOUNTER — Encounter: Payer: Self-pay | Admitting: Family Medicine

## 2014-07-14 MED ORDER — LEVOCETIRIZINE DIHYDROCHLORIDE 5 MG PO TABS: 5.0000 mg | ORAL_TABLET | Freq: Every evening | ORAL | Status: DC

## 2014-07-14 MED ORDER — TRAZODONE HCL 100 MG PO TABS: 100.0000 mg | ORAL_TABLET | Freq: Every day | ORAL | Status: DC

## 2014-07-14 MED ORDER — RANITIDINE HCL 150 MG PO TABS: 150.0000 mg | ORAL_TABLET | Freq: Two times a day (BID) | ORAL | Status: DC

## 2014-07-14 MED ORDER — FENOFIBRATE 160 MG PO TABS: 160.0000 mg | ORAL_TABLET | Freq: Every day | ORAL | Status: DC

## 2014-07-14 MED ORDER — DESVENLAFAXINE SUCCINATE ER 50 MG PO TB24: 50.0000 mg | ORAL_TABLET | Freq: Every day | ORAL | Status: DC

## 2014-07-14 MED ORDER — MONTELUKAST SODIUM 10 MG PO TABS: 10.0000 mg | ORAL_TABLET | Freq: Every day | ORAL | Status: DC

## 2014-07-14 NOTE — Addendum Note (Signed)
Addended by: Townsend Roger D on: 07/14/2014 04:07 PM   Modules accepted: Orders

## 2014-07-14 NOTE — Telephone Encounter (Signed)
I sent in trazodone 100mg  for patient as insomnia not controlled.

## 2014-09-12 ENCOUNTER — Other Ambulatory Visit: Payer: Self-pay | Admitting: *Deleted

## 2014-09-12 MED ORDER — TRAZODONE HCL 100 MG PO TABS: 100.0000 mg | ORAL_TABLET | Freq: Every day | ORAL | Status: DC

## 2014-09-12 NOTE — Telephone Encounter (Signed)
Trazodone refilled.

## 2014-11-01 ENCOUNTER — Encounter (HOSPITAL_COMMUNITY): Payer: Self-pay | Admitting: *Deleted

## 2014-11-01 ENCOUNTER — Emergency Department (HOSPITAL_COMMUNITY): Payer: Medicare PPO

## 2014-11-01 ENCOUNTER — Emergency Department (HOSPITAL_COMMUNITY)
Admission: EM | Admit: 2014-11-01 | Discharge: 2014-11-01 | Disposition: A | Discharge status: Home / Self Care | Payer: Medicare PPO | Attending: Emergency Medicine | Admitting: Emergency Medicine

## 2014-11-01 DIAGNOSIS — Z79899 Other long term (current) drug therapy: Secondary | ICD-10-CM | POA: Insufficient documentation

## 2014-11-01 DIAGNOSIS — K219 Gastro-esophageal reflux disease without esophagitis: Secondary | ICD-10-CM | POA: Insufficient documentation

## 2014-11-01 DIAGNOSIS — Y998 Other external cause status: Secondary | ICD-10-CM | POA: Diagnosis not present

## 2014-11-01 DIAGNOSIS — Y92091 Bathroom in other non-institutional residence as the place of occurrence of the external cause: Secondary | ICD-10-CM | POA: Insufficient documentation

## 2014-11-01 DIAGNOSIS — Y9389 Activity, other specified: Secondary | ICD-10-CM | POA: Diagnosis not present

## 2014-11-01 DIAGNOSIS — Z8639 Personal history of other endocrine, nutritional and metabolic disease: Secondary | ICD-10-CM | POA: Insufficient documentation

## 2014-11-01 DIAGNOSIS — M1990 Unspecified osteoarthritis, unspecified site: Secondary | ICD-10-CM | POA: Diagnosis not present

## 2014-11-01 DIAGNOSIS — Z8669 Personal history of other diseases of the nervous system and sense organs: Secondary | ICD-10-CM | POA: Diagnosis not present

## 2014-11-01 DIAGNOSIS — W228XXA Striking against or struck by other objects, initial encounter: Secondary | ICD-10-CM | POA: Diagnosis not present

## 2014-11-01 DIAGNOSIS — Z7982 Long term (current) use of aspirin: Secondary | ICD-10-CM | POA: Insufficient documentation

## 2014-11-01 DIAGNOSIS — S0990XA Unspecified injury of head, initial encounter: Secondary | ICD-10-CM | POA: Diagnosis present

## 2014-11-01 DIAGNOSIS — Z780 Asymptomatic menopausal state: Secondary | ICD-10-CM | POA: Diagnosis not present

## 2014-11-01 NOTE — ED Provider Notes (Signed)
CSN: [id number]     Arrival date & time 11/01/14  1755 History   First MD Initiated Contact with Patient 11/01/14 1814     Chief Complaint  Patient presents with  . Head Injury     (Consider location/radiation/quality/duration/timing/severity/associated sxs/prior Treatment) HPI Patient presents to the emergency department with a minor head injury that occurred while she was using the bathroom at [facility].  Patient states that this dull door came off the hinges and struck her in the right frontal forehead region.  Patient states she did not lose consciousness.  She has no nausea, vomiting, weakness, dizziness, neck pain, back pain, or syncope.  The patient states that she did not take any medications prior to arrival.  Palpation of the area makes the pain worse Past Medical History  Diagnosis Date  . Menopause   . Meniere disease   . Hyperlipidemia   . Arthritis   . GERD (gastroesophageal reflux disease)   . 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 No pertinent past surgical history. Family History  Problem Relation Age of Onset  . Heart disease      died 61, around age 64-CABG, smoker  . Cancer Father     melanoma   Social History  Substance Use Topics  . Smoking status: Never Smoker   . Smokeless tobacco: Never Used  . Alcohol Use: 0.0 oz/week    0 Standard drinks or equivalent per week     Comment: wine every few months   OB History    No data available     Review of Systems  All other systems negative except as documented in the HPI. All pertinent positives and negatives as reviewed in the HPI.  Allergies  Erythromycin and Sulfonamide derivatives  Home Medications   Prior to Admission medications   Medication Sig Start Date End Date Taking? Authorizing Provider  acetaminophen (TYLENOL) 500 MG tablet Take 1,000 mg by mouth daily as needed for mild pain or headache.   Yes Historical Provider, MD  aspirin EC 81 MG tablet Take 81 mg by mouth  daily.   Yes Historical Provider, MD  Biotin 5000 MCG CAPS Take 5,000 mcg by mouth daily.   Yes Historical Provider, MD  Calcium Carbonate-Vit D-Min (CALCIUM 1200 PO) Take 1 tablet by mouth 2 (two) times daily.    Yes Historical Provider, MD  desvenlafaxine (PRISTIQ) 50 MG 24 hr tablet Take 1 tablet (50 mg total) by mouth daily. 07/14/14  Yes Marin Olp, MD  fenofibrate 160 MG tablet Take 1 tablet (160 mg total) by mouth daily. 07/14/14  Yes Marin Olp, MD  KRILL OIL PO Take 1 tablet by mouth daily.   Yes Historical Provider, MD  levocetirizine (XYZAL) 5 MG tablet Take 1 tablet (5 mg total) by mouth every evening. 07/14/14  Yes Marin Olp, MD  magic mouthwash SOLN Take 5 mLs by mouth daily as needed for mouth pain.   Yes Historical Provider, MD  montelukast (SINGULAIR) 10 MG tablet Take 1 tablet (10 mg total) by mouth at bedtime. 07/14/14  Yes Marin Olp, MD  Multiple Vitamin (MULTIVITAMIN) tablet Take 1 tablet by mouth daily.   Yes Historical Provider, MD  polyethylene glycol powder (MIRALAX) powder Take 17 g by mouth daily as needed for mild constipation.    Yes Historical Provider, MD  Polyvinyl Alcohol-Povidone (REFRESH OP) Apply 1 drop to eye daily as needed (dry eye).   Yes Historical Provider, MD  ranitidine (ZANTAC) 150 MG  tablet Take 1 tablet (150 mg total) by mouth 2 (two) times daily. 07/14/14  Yes Marin Olp, MD  traZODone (DESYREL) 100 MG tablet Take 1 tablet (100 mg total) by mouth at bedtime. 09/12/14  Yes Marin Olp, MD   BP 134/79 mmHg  Pulse 70  Temp(Src) 98.2 F (36.8 C) (Oral)  Resp 15  SpO2 100% Physical Exam  Constitutional: She is oriented to person, place, and time. She appears well-developed and well-nourished. No distress.  HENT:  Head: Normocephalic.    Right Ear: No hemotympanum.  Left Ear: No hemotympanum.  Nose: Nose normal.  Mouth/Throat: Uvula is midline and oropharynx is clear and moist.  Eyes: Pupils are equal, round, and  reactive to light.  Neck: Normal range of motion. Neck supple.  Cardiovascular: Normal rate, regular rhythm and normal heart sounds.  Exam reveals no gallop and no friction rub.   No murmur heard. Pulmonary/Chest: Effort normal and breath sounds normal. No respiratory distress. She has no wheezes.  Neurological: She is alert and oriented to person, place, and time. She exhibits normal muscle tone. Coordination normal.  Skin: Skin is warm and dry. No rash noted. No erythema.  Psychiatric: She has a normal mood and affect. Her behavior is normal.  Nursing note and vitals reviewed.   ED Course  Procedures (including critical care time) Labs Review Labs Reviewed - No data to display  Imaging Review Ct Head Wo Contrast  11/01/2014  CLINICAL DATA:  Head injury EXAM: CT HEAD WITHOUT CONTRAST TECHNIQUE: Contiguous axial images were obtained from the base of the skull through the vertex without intravenous contrast. COMPARISON:  04/07/2014 FINDINGS: No skull fracture is noted. Paranasal sinuses and mastoid air cells are unremarkable. No intracranial hemorrhage, mass effect or midline shift. No acute cortical infarction. No mass lesion is noted on this unenhanced scan. IMPRESSION: No acute intracranial abnormality. Electronically Signed   By: Lahoma Crocker M.D.   On: 11/01/2014 20:12   I have personally reviewed and evaluated these images and lab results as part of my medical decision-making. Patient does not have any significant findings on exam or CT scan.  Patient will be advised to use ice on the area that is sore.  Told to return here as needed.  Patient agrees to the plan and all questions were answered    Dalia Heading, PA-C 11/01/14 2033  Lajean Saver, MD 11/01/14 2215
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
## 2014-11-01 NOTE — Discharge Instructions (Signed)
Tylenol and Motrin for any pain.  Return here as needed.  Use ice on the area that is sore

## 2014-11-11 ENCOUNTER — Other Ambulatory Visit: Payer: Self-pay | Admitting: Family Medicine

## 2014-11-24 ENCOUNTER — Other Ambulatory Visit (INDEPENDENT_AMBULATORY_CARE_PROVIDER_SITE_OTHER): Payer: Medicare PPO

## 2014-11-24 ENCOUNTER — Other Ambulatory Visit: Payer: BC Managed Care – PPO

## 2014-11-24 DIAGNOSIS — D72819 Decreased white blood cell count, unspecified: Secondary | ICD-10-CM | POA: Diagnosis not present

## 2014-11-24 DIAGNOSIS — N183 Chronic kidney disease, stage 3 unspecified: Secondary | ICD-10-CM

## 2014-11-24 LAB — CBC WITH DIFFERENTIAL/PLATELET
Basophils Absolute: 0.1 10*3/uL (ref 0.0–0.1)
Basophils Relative: 2.2 % (ref 0.0–3.0)
Eosinophils Absolute: 0.2 10*3/uL (ref 0.0–0.7)
Eosinophils Relative: 4.8 % (ref 0.0–5.0)
HEMATOCRIT: 37.8 % (ref 36.0–46.0)
HEMOGLOBIN: 12.6 g/dL (ref 12.0–15.0)
LYMPHS PCT: 41.7 % (ref 12.0–46.0)
Lymphs Abs: 1.4 10*3/uL (ref 0.7–4.0)
MCHC: 33.2 g/dL (ref 30.0–36.0)
MCV: 90.3 fl (ref 78.0–100.0)
MONO ABS: 0.3 10*3/uL (ref 0.1–1.0)
MONOS PCT: 9.8 % (ref 3.0–12.0)
Neutro Abs: 1.4 10*3/uL (ref 1.4–7.7)
Neutrophils Relative %: 41.5 % — ABNORMAL LOW (ref 43.0–77.0)
Platelets: 241 10*3/uL (ref 150.0–400.0)
RBC: 4.19 Mil/uL (ref 3.87–5.11)
RDW: 14.4 % (ref 11.5–15.5)
WBC: 3.4 10*3/uL — AB (ref 4.0–10.5)

## 2014-11-24 LAB — BASIC METABOLIC PANEL
BUN: 23 mg/dL (ref 6–23)
CALCIUM: 10.5 mg/dL (ref 8.4–10.5)
CO2: 24 meq/L (ref 19–32)
Chloride: 108 mEq/L (ref 96–112)
Creatinine, Ser: 1.11 mg/dL (ref 0.40–1.20)
GFR: 52.37 mL/min — ABNORMAL LOW (ref >60.00)
GLUCOSE: 102 mg/dL — AB (ref 70–99)
Potassium: 4.7 mEq/L (ref 3.5–5.1)
SODIUM: 147 meq/L — AB (ref 135–145)

## 2014-11-25 LAB — HIV ANTIBODY (ROUTINE TESTING W REFLEX): HIV: NONREACTIVE

## 2014-12-01 ENCOUNTER — Ambulatory Visit: Payer: BC Managed Care – PPO | Admitting: Family Medicine

## 2014-12-03 ENCOUNTER — Ambulatory Visit (INDEPENDENT_AMBULATORY_CARE_PROVIDER_SITE_OTHER): Payer: Medicare PPO | Admitting: Family Medicine

## 2014-12-03 ENCOUNTER — Encounter: Payer: Self-pay | Admitting: Family Medicine

## 2014-12-03 VITALS — BP 110/80 | HR 74 | Temp 98.5°F | Wt 161.0 lb

## 2014-12-03 DIAGNOSIS — E785 Hyperlipidemia, unspecified: Secondary | ICD-10-CM

## 2014-12-03 DIAGNOSIS — N183 Chronic kidney disease, stage 3 unspecified: Secondary | ICD-10-CM

## 2014-12-03 DIAGNOSIS — D72819 Decreased white blood cell count, unspecified: Secondary | ICD-10-CM

## 2014-12-03 DIAGNOSIS — M8580 Other specified disorders of bone density and structure, unspecified site: Secondary | ICD-10-CM | POA: Insufficient documentation

## 2014-12-03 DIAGNOSIS — Z23 Encounter for immunization: Secondary | ICD-10-CM

## 2014-12-03 NOTE — Assessment & Plan Note (Signed)
S: remains on Triglycerides-fenofibrate and fish oil  A/P: patient prefers to avoid statin- she is going to work on 5-10 lb weight loss by follow up for CPE and check lipids at that time. Work on lifestyle changes before medication. Needs 10 year risk calc.

## 2014-12-03 NOTE — Assessment & Plan Note (Signed)
S: stable for years as well but WBC was under 3 last visit but back above today A/P: consider smear at next visit as well as CBC with diff.

## 2014-12-03 NOTE — Assessment & Plan Note (Addendum)
S: GFR in 50s. Unclear cause ( no HTN, DM) of depression but stable >4 years. Avoids NSAIDS.  A/P: consider microalbumin to creatinine ratio and ace-i or arb at future visit.

## 2014-12-03 NOTE — Progress Notes (Signed)
Garret Reddish, MD  Subjective:  Kristin Bonilla is a 64 y.o. year old very pleasant female patient who presents for/with See problem oriented charting ROS- No chest pain or shortness of breath. No headache or blurry vision.   Past Medical History-  Patient Active Problem List   Diagnosis Date Noted  . Leukopenia 05/29/2014    Priority: Medium  . CKD (chronic kidney disease), stage III 12/20/2013    Priority: Medium  . Meniere disease 12/20/2013    Priority: Medium  . Depression 06/07/2007    Priority: Medium  . Hyperlipidemia 10/25/2006    Priority: Medium  . Osteopenia 12/03/2014    Priority: Low  . Insomnia 05/29/2014    Priority: Low  . Alopecia 04/27/2009    Priority: Low  . GERD (gastroesophageal reflux disease) 01/09/2008    Priority: Low  . Irritable bowel syndrome 01/09/2008    Priority: Low    Medications- reviewed and updated Current Outpatient Prescriptions  Medication Sig Dispense Refill  . aspirin EC 81 MG tablet Take 81 mg by mouth daily.    . Biotin 5000 MCG CAPS Take 5,000 mcg by mouth daily.    . Calcium Carbonate-Vit D-Min (CALCIUM 1200 PO) Take 1 tablet by mouth 2 (two) times daily.     Marland Kitchen desvenlafaxine (PRISTIQ) 50 MG 24 hr tablet Take 1 tablet (50 mg total) by mouth daily. 90 tablet 3  . fenofibrate 160 MG tablet Take 1 tablet (160 mg total) by mouth daily. 90 tablet 1  . KRILL OIL PO Take 1 tablet by mouth daily.    Marland Kitchen levocetirizine (XYZAL) 5 MG tablet Take 1 tablet (5 mg total) by mouth every evening. 90 tablet 3  . magic mouthwash SOLN Take 5 mLs by mouth daily as needed for mouth pain.    . montelukast (SINGULAIR) 10 MG tablet Take 1 tablet (10 mg total) by mouth at bedtime. 90 tablet 1  . Multiple Vitamin (MULTIVITAMIN) tablet Take 1 tablet by mouth daily.    . polyethylene glycol powder (MIRALAX) powder Take 17 g by mouth daily as needed for mild constipation.     . Polyvinyl Alcohol-Povidone (REFRESH OP) Apply 1 drop to eye daily as needed  (dry eye).    . ranitidine (ZANTAC) 150 MG tablet Take 1 tablet (150 mg total) by mouth 2 (two) times daily. 180 tablet 3  . traZODone (DESYREL) 100 MG tablet TAKE 1 TABLET AT BEDTIME 90 tablet 3  . acetaminophen (TYLENOL) 500 MG tablet Take 1,000 mg by mouth daily as needed for mild pain or headache.    . [DISCONTINUED] cetirizine (ZYRTEC) 10 MG tablet Take 10 mg by mouth daily.       No current facility-administered medications for this visit.    Objective: BP 110/80 mmHg  Pulse 74  Temp(Src) 98.5 F (36.9 C)  Wt 161 lb (73.029 kg)  SpO2 97% Gen: NAD, resting comfortably CV: RRR no murmurs rubs or gallops Lungs: CTAB no crackles, wheeze, rhonchi Abdomen: soft/nontender/nondistended/normal bowel sounds. No rebound or guarding.  Ext: no edema Skin: warm, dry Neuro: grossly normal, moves all extremities  Assessment/Plan:  Hyperlipidemia S: remains on Triglycerides-fenofibrate and fish oil  A/P: patient prefers to avoid statin- she is going to work on 5-10 lb weight loss by follow up for CPE and check lipids at that time. Work on lifestyle changes before medication. Needs 10 year risk calc.    CKD (chronic kidney disease), stage III S: GFR in 13s. Unclear cause ( no HTN,  DM) of depression but stable >4 years. Avoids NSAIDS.  A/P: consider microalbumin to creatinine ratio and ace-i or arb at future visit.    Leukopenia S: stable for years as well but WBC was under 3 last visit but back above today A/P: consider smear at next visit as well as CBC with diff.    Weight loss needed due to hyperglycemia, hyperlipidemia- extensive counseling Wt Readings from Last 3 Encounters:  12/03/14 161 lb (73.029 kg)  05/29/14 162 lb (73.483 kg)  04/07/14 160 lb (72.576 kg)   6 month follow up Return precautions advised.   Orders Placed This Encounter  Procedures  . Pneumococcal conjugate vaccine 13-valent  . Flu Vaccine QUAD 36+ mos IM

## 2014-12-03 NOTE — Patient Instructions (Addendum)
Same thing goes for you as husband- need to exercise regularly and eat healthy. For you this is to help your cholesterol and being at risk for diabetes. Would love to see you 150-155 at follow up.   No other changes  Follow up 6 months

## 2015-01-22 ENCOUNTER — Other Ambulatory Visit: Payer: Self-pay

## 2015-01-22 MED ORDER — MONTELUKAST SODIUM 10 MG PO TABS: 10.0000 mg | ORAL_TABLET | Freq: Every day | ORAL | Status: DC

## 2015-01-22 MED ORDER — FENOFIBRATE 160 MG PO TABS: 160.0000 mg | ORAL_TABLET | Freq: Every day | ORAL | Status: DC

## 2015-01-23 ENCOUNTER — Encounter: Payer: Self-pay | Admitting: Internal Medicine

## 2015-01-23 ENCOUNTER — Ambulatory Visit (INDEPENDENT_AMBULATORY_CARE_PROVIDER_SITE_OTHER)
Admission: RE | Admit: 2015-01-23 | Discharge: 2015-01-23 | Disposition: A | Discharge status: Home / Self Care | Payer: Medicare Other | Source: Ambulatory Visit | Attending: Internal Medicine | Admitting: Internal Medicine

## 2015-01-23 ENCOUNTER — Telehealth: Payer: Self-pay | Admitting: Family Medicine

## 2015-01-23 ENCOUNTER — Ambulatory Visit (INDEPENDENT_AMBULATORY_CARE_PROVIDER_SITE_OTHER): Payer: Medicare Other | Admitting: Internal Medicine

## 2015-01-23 VITALS — BP 140/90 | HR 76 | Temp 98.5°F | Wt 164.0 lb

## 2015-01-23 DIAGNOSIS — R053 Chronic cough: Secondary | ICD-10-CM

## 2015-01-23 DIAGNOSIS — K219 Gastro-esophageal reflux disease without esophagitis: Secondary | ICD-10-CM | POA: Diagnosis not present

## 2015-01-23 DIAGNOSIS — R05 Cough: Secondary | ICD-10-CM

## 2015-01-23 MED ORDER — PREDNISONE 10 MG PO TABS: ORAL_TABLET | ORAL | Status: DC

## 2015-01-23 MED ORDER — OMEPRAZOLE 40 MG PO CPDR: 40.0000 mg | DELAYED_RELEASE_CAPSULE | Freq: Every day | ORAL | Status: DC

## 2015-01-23 NOTE — Patient Instructions (Signed)
Our office will contact you regarding chest xray results and further instructions. Please follow up with your PCP within 1 month

## 2015-01-23 NOTE — Progress Notes (Signed)
Subjective:    Patient ID: Kristin Bonilla, female    DOB: Mar 15, 1950, 65 y.o.   MRN: [id number]  HPI  65 year old white female with history of Mnire's disease, GERD and hyperlipidemia presents with chronic cough. Patient reports episode of upper respiratory infection before Thanksgiving. During initial illness she experienced nonproductive cough with mild chest heaviness. Her symptoms seemed to improve but she is still experiencing intermittent coughing spells.  Her most recent episode involved coughing up yellowish sputum.  She has long history of GERD. She currently takes ranitidine 150 mg twice daily. She has breakthrough symptoms depending on her diet.  Non smoker.  She is retired - worked for school system.  Review of Systems   Mild shortness of breath, negative for shortness of breath    Past Medical History  Diagnosis Date  . Menopause   . Meniere disease   . Hyperlipidemia   . Arthritis   . GERD (gastroesophageal reflux disease)   . 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 Marital Status: Married    Spouse Name: N/A  . Number of Children: N/A  . Years of Education: N/A   Occupational History  . Not on file.   Social History Main Topics  . Smoking status: Never Smoker   . Smokeless tobacco: Never Used  . Alcohol Use: 0.0 oz/week    0 Standard drinks or equivalent per week     Comment: wine every few months  . Drug Use: No  . Sexual Activity: Yes   Other Topics Concern  . Not on file   Social History Narrative   Married. 2 children (son is special needs-in group home living). 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  Family History  Problem Relation Age of Onset  . Heart disease      died 45, around age 19-CABG, smoker  . Cancer Father     melanoma    Allergies  Allergen  Reactions  . Erythromycin Swelling  . Sulfonamide Derivatives Nausea Only    Current Outpatient Prescriptions on File Prior to Visit  Medication Sig Dispense Refill  . acetaminophen (TYLENOL) 500 MG tablet Take 1,000 mg by mouth daily as needed for mild pain or headache.    Marland Kitchen aspirin EC 81 MG tablet Take 81 mg by mouth daily.    . Biotin 5000 MCG CAPS Take 5,000 mcg by mouth daily.    . Calcium Carbonate-Vit D-Min (CALCIUM 1200 PO) Take 1 tablet by mouth 2 (two) times daily.     Marland Kitchen desvenlafaxine (PRISTIQ) 50 MG 24 hr tablet Take 1 tablet (50 mg total) by mouth daily. 90 tablet 3  . fenofibrate 160 MG tablet Take 1 tablet (160 mg total) by mouth daily. 90 tablet 1  . KRILL OIL PO Take 1 tablet by mouth daily.    Marland Kitchen levocetirizine (XYZAL) 5 MG tablet Take 1 tablet (5 mg total) by mouth every evening. 90 tablet 3  . montelukast (SINGULAIR) 10 MG tablet Take 1 tablet (10 mg total) by mouth at bedtime. 90 tablet 1  . Multiple Vitamin (MULTIVITAMIN) tablet Take 1 tablet by mouth daily.    . polyethylene glycol powder (MIRALAX) powder Take 17 g by mouth daily as needed for mild constipation.     . ranitidine (  ZANTAC) 150 MG tablet Take 1 tablet (150 mg total) by mouth 2 (two) times daily. 180 tablet 3  . traZODone (DESYREL) 100 MG tablet TAKE 1 TABLET AT BEDTIME 90 tablet 3  . Polyvinyl Alcohol-Povidone (REFRESH OP) Apply 1 drop to eye daily as needed (dry eye). Reported on 01/23/2015    . [DISCONTINUED] cetirizine (ZYRTEC) 10 MG tablet Take 10 mg by mouth daily.       No current facility-administered medications on file prior to visit.    BP 140/90 mmHg  Pulse 76  Temp(Src) 98.5 F (36.9 C) (Oral)  Wt 164 lb (74.39 kg)    Objective:   Physical Exam  Constitutional: She is oriented to person, place, and time. She appears well-developed and well-nourished. No distress.  HENT:  Head: Normocephalic and atraumatic.  Right Ear: External ear normal.  Left Ear: External ear normal.  Unable to  hear finger rub right ear  Neck: Neck supple.  Cardiovascular: Normal rate, regular rhythm and normal heart sounds.   No murmur heard. Pulmonary/Chest: Effort normal and breath sounds normal. She has no wheezes.  Lymphadenopathy:    She has no cervical adenopathy.  Neurological: She is alert and oriented to person, place, and time. No cranial nerve deficit.  Skin: Skin is warm and dry.  Psychiatric: She has a normal mood and affect. Her behavior is normal.        Assessment & Plan:    1.  Chronic Cough 2.  GERD  Patient may have residual reactive airway disease from viral upper respiratory infection in November 2016. I doubt she has atypical pneumonia. Considering her age obtain chest x-ray.  We discussed using prednisone taper for approximately 12 days. Patient's GERD may also be contributing factor. Change ranitidine to omeprazole. Follow-up with PCP in 4-6 weeks.

## 2015-01-23 NOTE — Telephone Encounter (Signed)
FYI - Patient has appt with Dr. Shawna Orleans 01/23/15.

## 2015-01-23 NOTE — Telephone Encounter (Signed)
Patient Name: Kristin Bonilla DOB: 1950/05/09 Initial Comment Caller states, she has a cough the started in Thanksgiving, it comes and goes. Now she has some yellow to her mucus. Nurse Assessment Nurse: Marcelline Deist, RN, Lynda Date/Time (Eastern Time): 01/23/2015 9:51:09 AM Confirm and document reason for call. If symptomatic, describe symptoms. ---Caller states she has a productive cough that started in Thanksgiving, it comes and goes. Now she has some yellow to her mucus. No fever. Has slight SOB at times. Has the patient traveled out of the country within the last 30 days? ---Not Applicable Does the patient have any new or worsening symptoms? ---Yes Will a triage be completed? ---Yes Related visit to physician within the last 2 weeks? ---No Does the PT have any chronic conditions? (i.e. diabetes, asthma, etc.) ---Yes List chronic conditions. ---sinus, allergies, acid reflux Is this a behavioral health or substance abuse call? ---No Guidelines Guideline Title Affirmed Question Affirmed Notes Cough - Chronic SEVERE coughing spells (e.g., whooping sound after coughing, vomiting after coughing) Final Disposition User See Physician within [place], RN, [employer] Referrals REFERRED TO PCP OFFICE Disagree/Comply: Leta Baptist

## 2015-01-23 NOTE — Progress Notes (Signed)
Pre visit review using our clinic review tool, if applicable. No additional management support is needed unless otherwise documented below in the visit note. 

## 2015-01-24 ENCOUNTER — Encounter: Payer: Self-pay | Admitting: Internal Medicine

## 2015-03-11 ENCOUNTER — Other Ambulatory Visit: Payer: Self-pay | Admitting: Family Medicine

## 2015-04-03 ENCOUNTER — Encounter: Payer: Self-pay | Admitting: Family Medicine

## 2015-04-06 ENCOUNTER — Other Ambulatory Visit: Payer: Self-pay

## 2015-04-06 MED ORDER — OMEPRAZOLE 40 MG PO CPDR: 40.0000 mg | DELAYED_RELEASE_CAPSULE | Freq: Every day | ORAL | Status: DC

## 2015-05-28 ENCOUNTER — Other Ambulatory Visit: Payer: Medicare PPO

## 2015-05-29 ENCOUNTER — Other Ambulatory Visit (INDEPENDENT_AMBULATORY_CARE_PROVIDER_SITE_OTHER): Payer: Medicare Other

## 2015-05-29 DIAGNOSIS — Z0000 Encounter for general adult medical examination without abnormal findings: Secondary | ICD-10-CM | POA: Diagnosis not present

## 2015-05-29 LAB — POC URINALSYSI DIPSTICK (AUTOMATED)
Bilirubin, UA: NEGATIVE
Blood, UA: NEGATIVE
Glucose, UA: NEGATIVE
KETONES UA: NEGATIVE
LEUKOCYTES UA: NEGATIVE
Nitrite, UA: NEGATIVE
PH UA: 6
PROTEIN UA: NEGATIVE
SPEC GRAV UA: 1.015
UROBILINOGEN UA: 0.2

## 2015-05-29 LAB — BASIC METABOLIC PANEL
BUN: 26 mg/dL — AB (ref 6–23)
CO2: 28 mEq/L (ref 19–32)
Calcium: 10.2 mg/dL (ref 8.4–10.5)
Chloride: 106 mEq/L (ref 96–112)
Creatinine, Ser: 1.13 mg/dL (ref 0.40–1.20)
GFR: 51.22 mL/min — AB (ref >60.00)
Glucose, Bld: 98 mg/dL (ref 70–99)
POTASSIUM: 3.7 meq/L (ref 3.5–5.1)
SODIUM: 141 meq/L (ref 135–145)

## 2015-05-29 LAB — CBC WITH DIFFERENTIAL/PLATELET
BASOS PCT: 2.1 % (ref 0.0–3.0)
Basophils Absolute: 0.1 10*3/uL (ref 0.0–0.1)
EOS ABS: 0.1 10*3/uL (ref 0.0–0.7)
EOS PCT: 4.5 % (ref 0.0–5.0)
HCT: 36.9 % (ref 36.0–46.0)
Hemoglobin: 12.5 g/dL (ref 12.0–15.0)
LYMPHS ABS: 1.6 10*3/uL (ref 0.7–4.0)
Lymphocytes Relative: 49.9 % — ABNORMAL HIGH (ref 12.0–46.0)
MCHC: 33.9 g/dL (ref 30.0–36.0)
MCV: 86.9 fl (ref 78.0–100.0)
MONO ABS: 0.3 10*3/uL (ref 0.1–1.0)
Monocytes Relative: 10.6 % (ref 3.0–12.0)
NEUTROS ABS: 1.1 10*3/uL — AB (ref 1.4–7.7)
NEUTROS PCT: 32.9 % — AB (ref 43.0–77.0)
PLATELETS: 265 10*3/uL (ref 150.0–400.0)
RBC: 4.25 Mil/uL (ref 3.87–5.11)
RDW: 14.1 % (ref 11.5–15.5)
WBC: 3.3 10*3/uL — AB (ref 4.0–10.5)

## 2015-05-29 LAB — LIPID PANEL
CHOLESTEROL: 197 mg/dL (ref 0–200)
HDL: 66.8 mg/dL (ref >39.00)
LDL Cholesterol: 118 mg/dL — ABNORMAL HIGH (ref 0–99)
NONHDL: 130.05
Total CHOL/HDL Ratio: 3
Triglycerides: 62 mg/dL (ref 0.0–149.0)
VLDL: 12.4 mg/dL (ref 0.0–40.0)

## 2015-05-29 LAB — HEPATIC FUNCTION PANEL
ALT: 14 U/L (ref 0–35)
AST: 21 U/L (ref 0–37)
Albumin: 4.3 g/dL (ref 3.5–5.2)
Alkaline Phosphatase: 58 U/L (ref 39–117)
BILIRUBIN DIRECT: 0.1 mg/dL (ref 0.0–0.3)
BILIRUBIN TOTAL: 0.4 mg/dL (ref 0.2–1.2)
Total Protein: 6.7 g/dL (ref 6.0–8.3)

## 2015-05-29 LAB — TSH: TSH: 3.89 u[IU]/mL (ref 0.35–4.50)

## 2015-06-04 ENCOUNTER — Ambulatory Visit (INDEPENDENT_AMBULATORY_CARE_PROVIDER_SITE_OTHER): Payer: Medicare Other | Admitting: Family Medicine

## 2015-06-04 ENCOUNTER — Encounter: Payer: Self-pay | Admitting: Family Medicine

## 2015-06-04 VITALS — BP 110/80 | HR 76 | Temp 98.2°F | Ht 61.5 in | Wt 161.0 lb

## 2015-06-04 DIAGNOSIS — R6889 Other general symptoms and signs: Secondary | ICD-10-CM | POA: Diagnosis not present

## 2015-06-04 DIAGNOSIS — N183 Chronic kidney disease, stage 3 unspecified: Secondary | ICD-10-CM

## 2015-06-04 DIAGNOSIS — D72819 Decreased white blood cell count, unspecified: Secondary | ICD-10-CM | POA: Diagnosis not present

## 2015-06-04 DIAGNOSIS — Z0001 Encounter for general adult medical examination with abnormal findings: Secondary | ICD-10-CM

## 2015-06-04 NOTE — Patient Instructions (Addendum)
No changes today  Follow up in 6 months- we will update bloodwork a few days beforehand. So schedule both at check out.   5-10 lb weight loss goal by follow up. Cholesterol up some but prediabetes numbers are better.

## 2015-06-04 NOTE — Progress Notes (Signed)
Phone: [phone]  Subjective:  Patient presents today for their annual physical. Chief complaint-noted.   See problem oriented charting- ROS- full  review of systems was completed and negative including No chest pain or shortness of breath. No headache or blurry vision.   The following were reviewed and entered/updated in epic: Past Medical History  Diagnosis Date  . Menopause   . Meniere disease   . Hyperlipidemia   . Arthritis   . GERD (gastroesophageal reflux disease)   . 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 Leukopenia 05/29/2014    Priority: Medium  . CKD (chronic kidney disease), stage III 12/20/2013    Priority: Medium  . Meniere disease 12/20/2013    Priority: Medium  . Depression 06/07/2007    Priority: Medium  . Hyperlipidemia 10/25/2006    Priority: Medium  . Osteopenia 12/03/2014    Priority: Low  . Insomnia 05/29/2014    Priority: Low  . Alopecia 04/27/2009    Priority: Low  . GERD (gastroesophageal reflux disease) 01/09/2008    Priority: Low  . Irritable bowel syndrome 01/09/2008    Priority: Low   No past surgical history on file.  Family History  Problem Relation Age of Onset  . Heart disease      died 31, around age 62-CABG, smoker  . Cancer Father     melanoma  . Heart disease Father     Died at 65  . COPD Mother   . Osteoporosis Mother     Medications- reviewed and updated Current Outpatient Prescriptions  Medication Sig Dispense Refill  . acetaminophen (TYLENOL) 500 MG tablet Take 1,000 mg by mouth daily as needed for mild pain or headache.    Marland Kitchen aspirin EC 81 MG tablet Take 81 mg by mouth daily.    . Biotin 5000 MCG CAPS Take 5,000 mcg by mouth daily.    . Calcium Carbonate-Vit D-Min (CALCIUM 1200 PO) Take 1 tablet by mouth 2 (two) times daily.     Marland Kitchen desvenlafaxine (PRISTIQ) 50 MG 24 hr tablet Take 1 tablet (50 mg total) by mouth daily. 90 tablet 3  . fenofibrate 160 MG  tablet Take 1 tablet every day 90 tablet 2  . KRILL OIL PO Take 1 tablet by mouth daily.    Marland Kitchen levocetirizine (XYZAL) 5 MG tablet Take 1 tablet (5 mg total) by mouth every evening. 90 tablet 3  . montelukast (SINGULAIR) 10 MG tablet Take 1 tablet at bedtime 90 tablet 2  . Multiple Vitamin (MULTIVITAMIN) tablet Take 1 tablet by mouth daily.    Marland Kitchen omeprazole (PRILOSEC) 40 MG capsule Take 1 capsule (40 mg total) by mouth daily. 90 capsule 3  . polyethylene glycol powder (MIRALAX) powder Take 17 g by mouth daily as needed for mild constipation.     . Polyvinyl Alcohol-Povidone (REFRESH OP) Apply 1 drop to eye daily as needed (dry eye). Reported on 01/23/2015    . Probiotic Product (PROBIOTIC ADVANCED PO) Take by mouth daily.    . traZODone (DESYREL) 100 MG tablet TAKE 1 TABLET AT BEDTIME 90 tablet 3  . [DISCONTINUED] cetirizine (ZYRTEC) 10 MG tablet Take 10 mg by mouth daily.       No current facility-administered medications for this visit.    Allergies-reviewed and updated Allergies  Allergen Reactions  . Erythromycin Swelling  . Sulfonamide Derivatives Nausea Only    Social History   Social History  . Marital Status: Married  Spouse Name: N/A  . Number of Children: N/A  . Years of Education: N/A   Social History Main Topics  . Smoking status: Never Smoker   . Smokeless tobacco: Never Used  . Alcohol Use: 0.0 oz/week    0 Standard drinks or equivalent per week     Comment: wine every few months  . Drug Use: No  . Sexual Activity: Yes   Other Topics Concern  . None   Social History Narrative   Married. 2 children (son is special needs-in group home living). 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 Oropharynx normal Neck: no thyromegaly CV: RRR no murmurs rubs or gallops Lungs: CTAB no crackles, wheeze, rhonchi Abdomen: soft/nontender/nondistended/normal bowel sounds. No rebound or guarding.  Ext: no edema Skin: warm, dry Neuro: grossly normal, moves all extremities, PERRLA  Assessment/Plan:  65 y.o. female presenting for annual physical.  Health Maintenance counseling: 1. Anticipatory guidance: Patient counseled regarding regular dental exams, eye exams, wearing seatbelts.  2. Risk factor reduction:  Advised patient of need for regular exercise and diet rich and fruits and vegetables to reduce risk of heart attack and stroke.  3. Immunizations/screenings/ancillary studies Immunization History  Administered Date(s) Administered  . Influenza Split 11/26/2010, 12/12/2011  . Influenza Whole 10/18/2006, 11/10/2008, 11/24/2009  . Influenza,inj,Quad PF,36+ Mos 12/12/2012, 10/29/2013, 12/03/2014  . Pneumococcal Conjugate-13 12/03/2014  . Pneumococcal Polysaccharide-23 12/12/2011  . Td 01/18/2000  . Tdap 11/26/2010  . Zoster 04/09/2012  4. Cervical cancer screening- 02/21/14 normal- will not need repeat due to age. Done with GYN.  5. Breast cancer screening-  breast exam normal and mammogram 02/21/14 normal, and had one 2017 but no records here yet. Asked to have next sent to [place] 6. Colon cancer screening - 03/12/12 normal with 5 year repeat due to polyp history 7. Skin cancer screening- Dr. Willette Pa yearly  Status of chronic or acute concerns   1. Hyperlipidemia on fenofibrate and fish oil. Patient wants to avoid statin. Weight stable since last visit- had planned on 5-10 lb weight loss. 10 year risk 3.8 %. Not exercising- but knows she needs to Lab Results  Component Value Date   CHOL 197 05/29/2015   HDL 66.80 05/29/2015   LDLCALC 118* 05/29/2015   LDLDIRECT 110.4 12/05/2011   TRIG 62.0 05/29/2015   CHOLHDL 3 05/29/2015   2. CKD III- GFR stable in 50s.  Still no nsaids.   3. Leukopenia- WBC in 3s stable with no worsening- will trend. HIV negative in 2016.   4. Hyperglycemia- not noted on these labs fortunately  5. Depression- stable on pristiq. Trazodone helps with sleep  6. GERD- stable on prilosec 40mg   7. Does have some tongue burning that used to resolve on benzos in past. Still resolves if drinks water. She takes MV- will make sure iron and b12 in it. Stay hydrated. Has seen dentist about this and no oral lesions  6 months.  Return precautions advised.   Orders Placed This Encounter  Procedures  . Pathologist smear review    Standing Status: Future     Number of Occurrences:      Standing  Expiration Date: 06/03/2016  . CBC with Differential    Standing Status: Future     Number of Occurrences:      Standing Expiration Date: 06/03/2016  . Basic metabolic panel        Standing Status: Future     Number of Occurrences:      Standing Expiration Date: 06/03/2016    Meds ordered this encounter  Medications  . Probiotic Product (PROBIOTIC ADVANCED PO)    Sig: Take by mouth daily.    Garret Reddish, MD

## 2015-08-03 ENCOUNTER — Other Ambulatory Visit: Payer: Self-pay | Admitting: Family Medicine

## 2015-08-03 NOTE — Telephone Encounter (Signed)
Yes thanks 

## 2015-08-04 ENCOUNTER — Other Ambulatory Visit: Payer: Self-pay

## 2015-08-04 MED ORDER — DESVENLAFAXINE SUCCINATE ER 50 MG PO TB24: 50.0000 mg | ORAL_TABLET | Freq: Every day | ORAL | Status: DC

## 2015-10-05 ENCOUNTER — Other Ambulatory Visit: Payer: Self-pay | Admitting: Family Medicine

## 2015-11-23 ENCOUNTER — Other Ambulatory Visit (INDEPENDENT_AMBULATORY_CARE_PROVIDER_SITE_OTHER): Payer: Medicare Other

## 2015-11-23 DIAGNOSIS — N183 Chronic kidney disease, stage 3 unspecified: Secondary | ICD-10-CM

## 2015-11-23 DIAGNOSIS — D72819 Decreased white blood cell count, unspecified: Secondary | ICD-10-CM | POA: Diagnosis not present

## 2015-11-23 LAB — CBC WITH DIFFERENTIAL/PLATELET
BASOS ABS: 0.1 10*3/uL (ref 0.0–0.1)
Basophils Relative: 2 % (ref 0.0–3.0)
EOS ABS: 0.1 10*3/uL (ref 0.0–0.7)
Eosinophils Relative: 4.1 % (ref 0.0–5.0)
HEMATOCRIT: 37.4 % (ref 36.0–46.0)
Hemoglobin: 12.7 g/dL (ref 12.0–15.0)
LYMPHS PCT: 45.1 % (ref 12.0–46.0)
Lymphs Abs: 1.3 10*3/uL (ref 0.7–4.0)
MCHC: 34 g/dL (ref 30.0–36.0)
MCV: 86.7 fl (ref 78.0–100.0)
Monocytes Absolute: 0.3 10*3/uL (ref 0.1–1.0)
Monocytes Relative: 11.1 % (ref 3.0–12.0)
NEUTROS ABS: 1.1 10*3/uL — AB (ref 1.4–7.7)
NEUTROS PCT: 37.7 % — AB (ref 43.0–77.0)
PLATELETS: 264 10*3/uL (ref 150.0–400.0)
RBC: 4.31 Mil/uL (ref 3.87–5.11)
RDW: 14 % (ref 11.5–15.5)
WBC: 3 10*3/uL — ABNORMAL LOW (ref 4.0–10.5)

## 2015-11-23 LAB — BASIC METABOLIC PANEL
BUN: 20 mg/dL (ref 6–23)
CHLORIDE: 106 meq/L (ref 96–112)
CO2: 29 mEq/L (ref 19–32)
Calcium: 10.7 mg/dL — ABNORMAL HIGH (ref 8.4–10.5)
Creatinine, Ser: 0.97 mg/dL (ref 0.40–1.20)
GFR: 61 mL/min (ref >60.00)
Glucose, Bld: 101 mg/dL — ABNORMAL HIGH (ref 70–99)
POTASSIUM: 4.4 meq/L (ref 3.5–5.1)
Sodium: 143 mEq/L (ref 135–145)

## 2015-11-24 LAB — PATHOLOGIST SMEAR REVIEW

## 2015-11-27 ENCOUNTER — Encounter: Payer: Self-pay | Admitting: Family Medicine

## 2015-11-27 ENCOUNTER — Ambulatory Visit (INDEPENDENT_AMBULATORY_CARE_PROVIDER_SITE_OTHER): Payer: Medicare Other | Admitting: Family Medicine

## 2015-11-27 VITALS — BP 114/82 | HR 84 | Temp 98.6°F | Wt 160.6 lb

## 2015-11-27 DIAGNOSIS — E785 Hyperlipidemia, unspecified: Secondary | ICD-10-CM

## 2015-11-27 DIAGNOSIS — G4700 Insomnia, unspecified: Secondary | ICD-10-CM

## 2015-11-27 DIAGNOSIS — Z23 Encounter for immunization: Secondary | ICD-10-CM

## 2015-11-27 DIAGNOSIS — D72819 Decreased white blood cell count, unspecified: Secondary | ICD-10-CM

## 2015-11-27 DIAGNOSIS — N183 Chronic kidney disease, stage 3 unspecified: Secondary | ICD-10-CM

## 2015-11-27 DIAGNOSIS — F325 Major depressive disorder, single episode, in full remission: Secondary | ICD-10-CM

## 2015-11-27 MED ORDER — MONTELUKAST SODIUM 10 MG PO TABS: 10.0000 mg | ORAL_TABLET | Freq: Every day | ORAL | 3 refills | Status: DC

## 2015-11-27 MED ORDER — OMEPRAZOLE 40 MG PO CPDR: 40.0000 mg | DELAYED_RELEASE_CAPSULE | Freq: Every day | ORAL | 3 refills | Status: DC

## 2015-11-27 MED ORDER — FENOFIBRATE 160 MG PO TABS: 160.0000 mg | ORAL_TABLET | Freq: Every day | ORAL | 3 refills | Status: DC

## 2015-11-27 MED ORDER — TRAZODONE HCL 100 MG PO TABS: 100.0000 mg | ORAL_TABLET | Freq: Every day | ORAL | 3 refills | Status: DC

## 2015-11-27 MED ORDER — DESVENLAFAXINE SUCCINATE ER 50 MG PO TB24: 50.0000 mg | ORAL_TABLET | Freq: Every day | ORAL | 3 refills | Status: DC

## 2015-11-27 MED ORDER — LEVOCETIRIZINE DIHYDROCHLORIDE 5 MG PO TABS: 5.0000 mg | ORAL_TABLET | Freq: Every evening | ORAL | 3 refills | Status: DC

## 2015-11-27 NOTE — Progress Notes (Signed)
Subjective:  Kristin Bonilla is a 65 y.o. year old very pleasant female patient who presents for/with See problem oriented charting ROS- No chest pain or shortness of breath. No headache or blurry vision. Prior tongue burning sensation has largely resolved- taking MV and thinks b12 in it. No SI/HI. No anhedonia or depressed mood. see any ROS included in HPI as well.   Past Medical History-  Patient Active Problem List   Diagnosis Date Noted  . Leukopenia 05/29/2014    Priority: Medium  . CKD (chronic kidney disease), stage III 12/20/2013    Priority: Medium  . Meniere disease 12/20/2013    Priority: Medium  . Major depression in full remission ([place]) 06/07/2007    Priority: Medium  . Hyperlipidemia 10/25/2006    Priority: Medium  . Osteopenia 12/03/2014    Priority: Low  . Insomnia 05/29/2014    Priority: Low  . Alopecia 04/27/2009    Priority: Low  . GERD (gastroesophageal reflux disease) 01/09/2008    Priority: Low  . Irritable bowel syndrome 01/09/2008    Priority: Low    Medications- reviewed and updated Current Outpatient Prescriptions  Medication Sig Dispense Refill  . acetaminophen (TYLENOL) 500 MG tablet Take 1,000 mg by mouth daily as needed for mild pain or headache.    Marland Kitchen aspirin EC 81 MG tablet Take 81 mg by mouth daily.    . Biotin 5000 MCG CAPS Take 5,000 mcg by mouth daily.    . Calcium Carbonate-Vit D-Min (CALCIUM 1200 PO) Take 1 tablet by mouth 2 (two) times daily.     Marland Kitchen desvenlafaxine (PRISTIQ) 50 MG 24 hr tablet Take 1 tablet (50 mg total) by mouth daily. 90 tablet 3  . fenofibrate 160 MG tablet Take 1 tablet every day 90 tablet 2  . KRILL OIL PO Take 1 tablet by mouth daily.    Marland Kitchen levocetirizine (XYZAL) 5 MG tablet Take 1 tablet (5 mg total) by mouth every evening. 90 tablet 3  . montelukast (SINGULAIR) 10 MG tablet Take 1 tablet at bedtime 90 tablet 2  . Multiple Vitamin (MULTIVITAMIN) tablet Take 1 tablet by mouth daily.    Marland Kitchen omeprazole (PRILOSEC) 40 MG  capsule Take 1 capsule (40 mg total) by mouth daily. 90 capsule 3  . polyethylene glycol powder (MIRALAX) powder Take 17 g by mouth daily as needed for mild constipation.     . Polyvinyl Alcohol-Povidone (REFRESH OP) Apply 1 drop to eye daily as needed (dry eye). Reported on 01/23/2015    . Probiotic Product (PROBIOTIC ADVANCED PO) Take by mouth daily.    . traZODone (DESYREL) 100 MG tablet Take 1 tablet at bedtime 90 tablet 2   No current facility-administered medications for this visit.     Objective: BP 114/82 (BP Location: Left Arm, Patient Position: Sitting, Cuff Size: Normal)   Pulse 84   Temp 98.6 F (37 C) (Oral)   Wt 160 lb 9.6 oz (72.8 kg)   SpO2 97%   BMI 29.85 kg/m  Gen: NAD, resting comfortably CV: RRR no murmurs rubs or gallops Lungs: CTAB no crackles, wheeze, rhonchi Abdomen: soft/nontender/nondistended/normal bowel sounds. overweight Ext: no edema Skin: warm, dry Neuro: grossly normal, moves all extremities  Assessment/Plan:  Leukopenia S: leukopenia stable in low 3s since 2013. Path smear review not majorly concerning.  A/P: Unclear etiology. Patient and I discussed and would like to get hematology opinion in coming months   Insomnia S:Trazodone 100mg  effective in the past. Has stopped workign over last few  months. Does admit to heavy phone use late in the evening before bed A/P: we discussed limiting screen time. Considered doxepin but can cause leukopenia. Will use melatonin 1-5mg  trial - will get OTC. If fails by 1 month- will call in [place] though she is concerned about dependence when I discussed this with her.    Major depression in full remission ([place]) S: pristiq remains very helpful with phq2 of 0. She does have sleep issues as noted in insomnia A/P: refilled pristiq as doing well   CKD (chronic kidney disease), stage III Hypercalcemia S: GFR on last test slightly above 60 for first time in some time. stilla voiding nsaids and bp controlled.  Hypercalcemia on labs A/P: continue to monitor renal function every 6 months at least  With hypercalcemia on labs- we opted to hold 1 of her 2 calcium supplements and repeat next week, may have to hold both   Hyperlipidemia S: well controlled on fenofibrate in regards to triglycerices. LDL slightly high. No myalgias.  Lab Results  Component Value Date   CHOL 197 05/29/2015   HDL 66.80 05/29/2015   LDLCALC 118 (H) 05/29/2015   LDLDIRECT 110.4 12/05/2011   TRIG 62.0 05/29/2015   CHOLHDL 3 05/29/2015   A/P: patient has lost 10 lbs since getting back from vacation- 1 lb down since last visit. We discussed continued efforts. Is doing low carb so having more meat- interested to see levels at CPE. Advised return fasting in 6 months for physical- update labs at that time.   also mild right wrist pain noted- could be arthritis. Not worse with any resisted activity of hand.   Return in about 6 months (around 05/26/2016) for physical.  Orders Placed This Encounter  Procedures  . Flu vaccine HIGH DOSE PF  . Basic metabolic panel    Standing Status:   Future    Standing Expiration Date:   11/26/2016  . Ambulatory referral to Hematology    Referral Priority:   Routine    Referral Type:   Consultation    Referral Reason:   Specialty Services Required    Requested Specialty:   Oncology    Number of Visits Requested:   1    Meds ordered this encounter  Medications  . fenofibrate 160 MG tablet    Sig: Take 1 tablet (160 mg total) by mouth daily.    Dispense:  90 tablet    Refill:  3  . levocetirizine (XYZAL) 5 MG tablet    Sig: Take 1 tablet (5 mg total) by mouth every evening.    Dispense:  90 tablet    Refill:  3  . montelukast (SINGULAIR) 10 MG tablet    Sig: Take 1 tablet (10 mg total) by mouth at bedtime.    Dispense:  90 tablet    Refill:  3  . omeprazole (PRILOSEC) 40 MG capsule    Sig: Take 1 capsule (40 mg total) by mouth daily.    Dispense:  90 capsule    Refill:  3  .  traZODone (DESYREL) 100 MG tablet    Sig: Take 1 tablet (100 mg total) by mouth at bedtime.    Dispense:  90 tablet    Refill:  3  . desvenlafaxine (PRISTIQ) 50 MG 24 hr tablet    Sig: Take 1 tablet (50 mg total) by mouth daily.    Dispense:  90 tablet    Refill:  3    Return precautions advised.  Garret Reddish, MD

## 2015-11-27 NOTE — Assessment & Plan Note (Signed)
S: well controlled on fenofibrate in regards to triglycerices. LDL slightly high. No myalgias.  Lab Results  Component Value Date   CHOL 197 05/29/2015   HDL 66.80 05/29/2015   LDLCALC 118 (H) 05/29/2015   LDLDIRECT 110.4 12/05/2011   TRIG 62.0 05/29/2015   CHOLHDL 3 05/29/2015   A/P: patient has lost 10 lbs since getting back from vacation- 1 lb down since last visit. We discussed continued efforts. Is doing low carb so having more meat- interested to see levels at CPE. Advised return fasting in 6 months for physical- update labs at that time.

## 2015-11-27 NOTE — Patient Instructions (Signed)
Calcium - reduce to 1 tablet a day, repeat sometime next week- schedule lab visit before you leave  We will call you within a week about your referral to hematology to check in on low white blood cells. If you do not hear within 2 weeks, give [place] a call.   Trial no cell phone, computer, tv 1 hour before bed for a week. If not working- add in melatonin 1-5mg  before bed. Give this at least 3 week trial. Continue trazodone with above steps- if not working in a month, trial [employer]

## 2015-11-27 NOTE — Assessment & Plan Note (Signed)
S: leukopenia stable in low 3s since 2013. Path smear review not majorly concerning.  A/P: Unclear etiology. Patient and I discussed and would like to get hematology opinion in coming months

## 2015-11-27 NOTE — Assessment & Plan Note (Signed)
S:Trazodone 100mg  effective in the past. Has stopped workign over last few months. Does admit to heavy phone use late in the evening before bed A/P: we discussed limiting screen time. Considered doxepin but can cause leukopenia. Will use melatonin 1-5mg  trial - will get OTC. If fails by 1 month- will call in [place] though she is concerned about dependence when I discussed this with her.

## 2015-11-27 NOTE — Assessment & Plan Note (Signed)
S: pristiq remains very helpful with phq2 of 0. She does have sleep issues as noted in insomnia A/P: refilled pristiq as doing well

## 2015-11-27 NOTE — Assessment & Plan Note (Signed)
Hypercalcemia S: GFR on last test slightly above 60 for first time in some time. stilla voiding nsaids and bp controlled. Hypercalcemia on labs A/P: continue to monitor renal function every 6 months at least  With hypercalcemia on labs- we opted to hold 1 of her 2 calcium supplements and repeat next week, may have to hold both

## 2015-11-27 NOTE — Progress Notes (Signed)
Pre visit review using our clinic review tool, if applicable. No additional management support is needed unless otherwise documented below in the visit note. 

## 2015-12-02 ENCOUNTER — Telehealth: Payer: Self-pay | Admitting: Hematology

## 2015-12-02 ENCOUNTER — Encounter: Payer: Self-pay | Admitting: Family Medicine

## 2015-12-02 ENCOUNTER — Encounter: Payer: Self-pay | Admitting: Hematology

## 2015-12-02 NOTE — Telephone Encounter (Signed)
Pt confirmed appt, verified demo and insurance, mailed pt letter,  In basket referring provider appt. Date/ time

## 2015-12-03 ENCOUNTER — Other Ambulatory Visit (INDEPENDENT_AMBULATORY_CARE_PROVIDER_SITE_OTHER): Payer: Medicare Other

## 2015-12-03 LAB — BASIC METABOLIC PANEL
BUN: 29 mg/dL — ABNORMAL HIGH (ref 6–23)
CALCIUM: 10.4 mg/dL (ref 8.4–10.5)
CHLORIDE: 105 meq/L (ref 96–112)
CO2: 30 meq/L (ref 19–32)
Creatinine, Ser: 1.03 mg/dL (ref 0.40–1.20)
GFR: 56.91 mL/min — ABNORMAL LOW (ref >60.00)
GLUCOSE: 91 mg/dL (ref 70–99)
POTASSIUM: 4.3 meq/L (ref 3.5–5.1)
SODIUM: 142 meq/L (ref 135–145)

## 2016-01-19 ENCOUNTER — Encounter: Payer: Self-pay | Admitting: Hematology

## 2016-01-19 ENCOUNTER — Ambulatory Visit (HOSPITAL_BASED_OUTPATIENT_CLINIC_OR_DEPARTMENT_OTHER): Payer: Medicare Other | Admitting: Hematology

## 2016-01-19 ENCOUNTER — Telehealth: Payer: Self-pay | Admitting: Hematology

## 2016-01-19 ENCOUNTER — Ambulatory Visit (HOSPITAL_BASED_OUTPATIENT_CLINIC_OR_DEPARTMENT_OTHER): Payer: Medicare Other

## 2016-01-19 VITALS — BP 131/61 | HR 85 | Temp 98.5°F | Resp 18 | Wt 163.2 lb

## 2016-01-19 DIAGNOSIS — F329 Major depressive disorder, single episode, unspecified: Secondary | ICD-10-CM

## 2016-01-19 DIAGNOSIS — N182 Chronic kidney disease, stage 2 (mild): Secondary | ICD-10-CM | POA: Diagnosis not present

## 2016-01-19 DIAGNOSIS — Z808 Family history of malignant neoplasm of other organs or systems: Secondary | ICD-10-CM | POA: Diagnosis not present

## 2016-01-19 DIAGNOSIS — D708 Other neutropenia: Secondary | ICD-10-CM

## 2016-01-19 LAB — CBC & DIFF AND RETIC
BASO%: 2.2 % — ABNORMAL HIGH (ref 0.0–2.0)
BASOS ABS: 0.1 10*3/uL (ref 0.0–0.1)
EOS%: 4.3 % (ref 0.0–7.0)
Eosinophils Absolute: 0.2 10*3/uL (ref 0.0–0.5)
HEMATOCRIT: 39 % (ref 34.8–46.6)
HGB: 12.9 g/dL (ref 11.6–15.9)
Immature Retic Fract: 2.7 % (ref 1.60–10.00)
LYMPH%: 39.4 % (ref 14.0–49.7)
MCH: 29.4 pg (ref 25.1–34.0)
MCHC: 33.1 g/dL (ref 31.5–36.0)
MCV: 88.8 fL (ref 79.5–101.0)
MONO#: 0.4 10*3/uL (ref 0.1–0.9)
MONO%: 7.1 % (ref 0.0–14.0)
NEUT#: 2.3 10*3/uL (ref 1.5–6.5)
NEUT%: 47 % (ref 38.4–76.8)
PLATELETS: 288 10*3/uL (ref 145–400)
RBC: 4.39 10*6/uL (ref 3.70–5.45)
RDW: 13.5 % (ref 11.2–14.5)
RETIC CT ABS: 43.9 10*3/uL (ref 33.70–90.70)
Retic %: 1 % (ref 0.70–2.10)
WBC: 4.9 10*3/uL (ref 3.9–10.3)
lymph#: 1.9 10*3/uL (ref 0.9–3.3)

## 2016-01-19 LAB — COMPREHENSIVE METABOLIC PANEL
ALT: 19 U/L (ref 0–55)
ANION GAP: 11 meq/L (ref 3–11)
AST: 23 U/L (ref 5–34)
Albumin: 4.3 g/dL (ref 3.5–5.0)
Alkaline Phosphatase: 86 U/L (ref 40–150)
BILIRUBIN TOTAL: 0.36 mg/dL (ref 0.20–1.20)
BUN: 23.7 mg/dL (ref 7.0–26.0)
CHLORIDE: 104 meq/L (ref 98–109)
CO2: 27 meq/L (ref 22–29)
Calcium: 10.1 mg/dL (ref 8.4–10.4)
Creatinine: 1.1 mg/dL (ref 0.6–1.1)
EGFR: 53 mL/min/{1.73_m2} — AB (ref >90)
Glucose: 95 mg/dl (ref 70–140)
Potassium: 3.7 mEq/L (ref 3.5–5.1)
Sodium: 142 mEq/L (ref 136–145)
TOTAL PROTEIN: 7.3 g/dL (ref 6.4–8.3)

## 2016-01-19 LAB — LACTATE DEHYDROGENASE: LDH: 170 U/L (ref 125–245)

## 2016-01-19 LAB — CHCC SMEAR

## 2016-01-19 NOTE — Telephone Encounter (Signed)
Gave patient avs report and appointments for July.  °

## 2016-01-20 ENCOUNTER — Telehealth: Payer: Self-pay | Admitting: *Deleted

## 2016-01-20 LAB — VITAMIN B12: VITAMIN B 12: 489 pg/mL (ref 232–1245)

## 2016-01-20 LAB — FOLATE

## 2016-01-20 LAB — TSH: TSH: 2.404 m(IU)/L (ref 0.308–3.960)

## 2016-01-20 LAB — FOLATE RBC
FOLATE, HEMOLYSATE: 613.9 ng/mL
Folate, RBC: 1628 ng/mL (ref >498)
Hematocrit: 37.7 % (ref 34.0–46.6)

## 2016-01-20 LAB — HEPATITIS C ANTIBODY: HEP C VIRUS AB: 0.1 {s_co_ratio} (ref 0.0–0.9)

## 2016-01-20 NOTE — Progress Notes (Signed)
Marland Kitchen    HEMATOLOGY/ONCOLOGY CONSULTATION NOTE  Date of Service: .01/19/2016  Patient Care Team: Marin Olp, MD as PCP - General (Family Medicine)  CHIEF COMPLAINTS/PURPOSE OF CONSULTATION:  Neutropenia  HISTORY OF PRESENTING ILLNESS:  Kristin Bonilla is a wonderful 66 y.o. female who has been referred to [place] by Dr .Garret Reddish, MD  for evaluation and management of chronic neutropenia.  Patient has a history of Mnire's disease, dyslipidemia, GERD, irritable bowel syndrome, chronic kidney disease stage II, depression notes that she has had low white blood counts for at least 5 years that she is aware of. She notes that she has some chronic allergies attempt to convert to bronchitis and therefore is on chronic treatment with Singulair and antihistamines. She has also been on omeprazole daily for last 2 years. No other acute new medications. Some tick bites but no rashes fevers or chills. Has had an occasional episode of bronchitis in the last couple years no significant progressive infections requiring hospitalization or ER visits. Patient recently had laboratory primary care physician that showed that her WBC count was 3k with an [place] of 1.1k . About a year ago her dose to count was 3.4k with an ANC of 1.4k. Patient reports no fevers no chills no night sweats and no unexpected weight loss no fatigue. Her hemoglobin and platelet counts remained within normal limits. No bone pains. No other new focal symptoms.   MEDICAL HISTORY:  Past Medical History:  Diagnosis Date  . Arthritis   . GERD (gastroesophageal reflux disease)   . Hyperlipidemia   . 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 Meniere disease   . Menopause     SURGICAL HISTORY: History reviewed. No pertinent surgical history.  SOCIAL HISTORY: Social History   Social History  . Marital status: Married    Spouse name: N/A  . Number of children: N/A  . Years of education: N/A   Occupational History  .  Not on file.   Social History Main Topics  . Smoking status: Never Smoker  . Smokeless tobacco: Never Used  . Alcohol use 0.0 oz/week     Comment: wine every few months  . Drug use: No  . Sexual activity: Yes   Other Topics Concern  . Not on file   Social History Narrative   Married. 2 children (son is special needs-in group home living). 1 grandchild      Retired from [employer] different grades      Hobbies: reading, ipad, former bowling a lot      FAMILY HISTORY: Family History  Problem Relation Age of Onset  . Cancer Father     melanoma  . Heart disease Father     Died at 51  . COPD Mother   . Osteoporosis Mother   . Heart disease      died 16, around age 36-CABG, smoker    ALLERGIES:  is allergic to erythromycin and sulfonamide derivatives.  MEDICATIONS:  Current Outpatient Prescriptions  Medication Sig Dispense Refill  . acetaminophen (TYLENOL) 500 MG tablet Take 1,000 mg by mouth daily as needed for mild pain or headache.    Marland Kitchen aspirin EC 81 MG tablet Take 81 mg by mouth daily.    . Biotin 5000 MCG CAPS Take 5,000 mcg by mouth daily.    . Calcium Carbonate-Vit D-Min (CALCIUM 1200 PO) Take 1 tablet by mouth 2 (two) times daily.     Marland Kitchen desvenlafaxine (PRISTIQ) 50 MG 24 hr tablet Take 1 tablet (  50 mg total) by mouth daily. 90 tablet 3  . fenofibrate 160 MG tablet Take 1 tablet (160 mg total) by mouth daily. 90 tablet 3  . KRILL OIL PO Take 1 tablet by mouth daily.    Marland Kitchen levocetirizine (XYZAL) 5 MG tablet Take 1 tablet (5 mg total) by mouth every evening. 90 tablet 3  . montelukast (SINGULAIR) 10 MG tablet Take 1 tablet (10 mg total) by mouth at bedtime. 90 tablet 3  . Multiple Vitamin (MULTIVITAMIN) tablet Take 1 tablet by mouth daily.    Marland Kitchen omeprazole (PRILOSEC) 40 MG capsule Take 1 capsule (40 mg total) by mouth daily. 90 capsule 3  . polyethylene glycol powder (MIRALAX) powder Take 17 g by mouth daily as needed for mild constipation.     .  Polyvinyl Alcohol-Povidone (REFRESH OP) Apply 1 drop to eye daily as needed (dry eye). Reported on 01/23/2015    . Probiotic Product (PROBIOTIC ADVANCED PO) Take by mouth daily.    . traZODone (DESYREL) 100 MG tablet Take 1 tablet (100 mg total) by mouth at bedtime. 90 tablet 3   No current facility-administered medications for this visit.     REVIEW OF SYSTEMS:    10 Point review of Systems was done is negative except as noted above.  PHYSICAL EXAMINATION: ECOG PERFORMANCE STATUS: 1 - Symptomatic but completely ambulatory  . Vitals:   01/19/16 1341  BP: 131/61  Pulse: 85  Resp: 18  Temp: 98.5 F (36.9 C)   Filed Weights   01/19/16 1341  Weight: 163 lb 3.2 oz (74 kg)   .Body mass index is 30.34 kg/m.  GENERAL:alert, in no acute distress and comfortable SKIN: skin color, texture, turgor are normal, no rashes or significant lesions EYES: normal, conjunctiva are pink and non-injected, sclera clear OROPHARYNX:no exudate, no erythema and lips, buccal mucosa, and tongue normal  NECK: supple, no JVD, thyroid normal size, non-tender, without nodularity LYMPH:  no palpable lymphadenopathy in the cervical, axillary or inguinal LUNGS: clear to auscultation with normal respiratory effort HEART: regular rate & rhythm,  no murmurs and no lower extremity edema ABDOMEN: abdomen soft, non-tender, normoactive bowel sounds U Bertolino K. I have Musculoskeletal: no cyanosis of digits and no clubbing  PSYCH: alert & oriented x 3 with fluent speech NEURO: no focal motor/sensory deficits  LABORATORY DATA:  I have reviewed the data as listed  . CBC Latest Ref Rng & Units 01/19/2016 11/23/2015 05/29/2015  WBC 3.9 - 10.3 10e3/uL 4.9 3.0(L) 3.3(L)  Hemoglobin 11.6 - 15.9 g/dL 12.9 12.7 12.5  Hematocrit 34.8 - 46.6 % 39.0 37.4 36.9  Platelets 145 - 400 10e3/uL 288 264.0 265.0   . CBC    Component Value Date/Time   WBC 4.9 01/19/2016 1451   WBC 3.0 (L) 11/23/2015 1029   RBC 4.39 01/19/2016  1451   RBC 4.31 11/23/2015 1029   HGB 12.9 01/19/2016 1451   HCT 39.0 01/19/2016 1451   PLT 288 01/19/2016 1451   MCV 88.8 01/19/2016 1451   MCH 29.4 01/19/2016 1451   MCHC 33.1 01/19/2016 1451   MCHC 34.0 11/23/2015 1029   RDW 13.5 01/19/2016 1451   LYMPHSABS 1.9 01/19/2016 1451   MONOABS 0.4 01/19/2016 1451   EOSABS 0.2 01/19/2016 1451   BASOSABS 0.1 01/19/2016 1451    . CMP Latest Ref Rng & Units 01/19/2016 12/03/2015 11/23/2015  Glucose 70 - 140 mg/dl 95 91 101(H)  BUN 7.0 - 26.0 mg/dL 23.7 29(H) 20  Creatinine 0.6 - 1.1 mg/dL  1.1 1.03 0.97  Sodium 136 - 145 mEq/L 142 142 143  Potassium 3.5 - 5.1 mEq/L 3.7 4.3 4.4  Chloride 96 - 112 mEq/L - 105 106  CO2 22 - 29 mEq/L 27 30 29   Calcium 8.4 - 10.4 mg/dL 10.1 10.4 10.7(H)  Total Protein 6.4 - 8.3 g/dL 7.3 - -  Total Bilirubin 0.20 - 1.20 mg/dL 0.36 - -  Alkaline Phos 40 - 150 U/L 86 - -  AST 5 - 34 U/L 23 - -  ALT 0 - 55 U/L 19 - -     Component     Latest Ref Rng & Units 12/05/2011 12/03/2012 05/20/2014 11/24/2014  NEUT#     1.5 - 6.5 10e3/uL 1.1 (L) 1.2 (L) 1.0 (L) 1.4   Component     Latest Ref Rng & Units 05/29/2015 11/23/2015 01/19/2016  NEUT#     1.5 - 6.5 10e3/uL 1.1 (L) 1.1 (L) 2.3    RADIOGRAPHIC STUDIES: I have personally reviewed the radiological images as listed and agreed with the findings in the report. No results found.  ASSESSMENT & PLAN:   66 year old female with  1) Isolated Chronic Leucopenia and Neutropenia since 2013 without any overt worsening.  In fact the patient's WBC count and neutrophil counts are completely normal today. Overt symptomatology suggestive of a lymphoproliferative process. No associated anemia or thrombocytopenia. Neutropenia could have a seasonal competent related to allergies. Alternatively could be a mild immune neutropenia. Irrespective of the cause given the normalization of counts today the patient appears to have adequate bone marrow reserve.  Component     Latest Ref  Rng & Units 01/19/2016  LDH     125 - 245 U/L 170  Vitamin B12     232 - 1,245 pg/mL 489  TSH     0.308 - 3.960 m(IU)/L 2.404  Folate     >3.0 ng/mL >20.0  Hep C Virus Ab     0.0 - 0.9 s/co ratio 0.1   Plan -No indication for bone marrow examination at this time. -No indication for G-CSF or anemia. Additional workup at this time. -Will follow-up on the rest of the workup though do not anticipate that it would add any significant information. -Would try to get the patient off chronic PPI therapy if possible. PPIs although rare can cause chronic leukopenia and rarely agranulocytosis. -Continue follow-up with primary care physician.  Reconsult [place] of any other acute new concerns arise.  All of the patients questions were answered with apparent satisfaction. The patient knows to call the clinic with any problems, questions or concerns.  I spent 40 minutes counseling the patient face to face. The total time spent in the appointment was 45  minutes and more than 50% was on counseling and direct patient cares.    Sullivan Lone MD [place] AAHIVMS [facility] Hematology/Oncology Physician [facility]  (Office):       [phone] (Work cell):  [phone] (Fax):           [phone]

## 2016-01-20 NOTE — Telephone Encounter (Signed)
Per Dr. Irene Limbo, called pt to inform lab results are WNL.  No need for follow up at this time.  Pt instructed to follow up with PCP and call for any new questions or concerns.  Pt verbalized understanding/appreciative of call.

## 2016-01-21 LAB — MULTIPLE MYELOMA PANEL, SERUM
Albumin SerPl Elph-Mcnc: 4 g/dL (ref 2.9–4.4)
Albumin/Glob SerPl: 1.5 (ref 0.7–1.7)
Alpha 1: 0.2 g/dL (ref 0.0–0.4)
Alpha2 Glob SerPl Elph-Mcnc: 0.6 g/dL (ref 0.4–1.0)
B-GLOBULIN SERPL ELPH-MCNC: 1.1 g/dL (ref 0.7–1.3)
Gamma Glob SerPl Elph-Mcnc: 0.7 g/dL (ref 0.4–1.8)
Globulin, Total: 2.7 g/dL (ref 2.2–3.9)
IGM (IMMUNOGLOBIN M), SRM: 75 mg/dL (ref 26–217)
IgA, Qn, Serum: 155 mg/dL (ref 87–352)
IgG, Qn, Serum: 715 mg/dL (ref 700–1600)
TOTAL PROTEIN: 6.7 g/dL (ref 6.0–8.5)

## 2016-01-21 LAB — COPPER, SERUM: Copper: 110 ug/dL (ref 72–166)

## 2016-01-23 ENCOUNTER — Encounter: Payer: Self-pay | Admitting: Family Medicine

## 2016-02-29 LAB — HM MAMMOGRAPHY

## 2016-03-16 ENCOUNTER — Encounter: Payer: Self-pay | Admitting: Family Medicine

## 2016-03-17 MED ORDER — TIZANIDINE HCL 2 MG PO CAPS: 2.0000 mg | ORAL_CAPSULE | Freq: Three times a day (TID) | ORAL | 0 refills | Status: DC | PRN

## 2016-05-17 ENCOUNTER — Encounter: Payer: Self-pay | Admitting: Family Medicine

## 2016-05-25 ENCOUNTER — Ambulatory Visit (INDEPENDENT_AMBULATORY_CARE_PROVIDER_SITE_OTHER): Payer: Medicare Other | Admitting: Family Medicine

## 2016-05-25 ENCOUNTER — Encounter: Payer: Self-pay | Admitting: Family Medicine

## 2016-05-25 VITALS — BP 112/80 | HR 82 | Temp 98.1°F | Ht 61.5 in | Wt 159.0 lb

## 2016-05-25 DIAGNOSIS — R059 Cough, unspecified: Secondary | ICD-10-CM

## 2016-05-25 DIAGNOSIS — R05 Cough: Secondary | ICD-10-CM

## 2016-05-25 DIAGNOSIS — J069 Acute upper respiratory infection, unspecified: Secondary | ICD-10-CM | POA: Diagnosis not present

## 2016-05-25 MED ORDER — ALBUTEROL SULFATE HFA 108 (90 BASE) MCG/ACT IN AERS
2.0000 | INHALATION_SPRAY | Freq: Four times a day (QID) | RESPIRATORY_TRACT | 0 refills | Status: DC | PRN
Start: 2016-05-25 — End: 2016-07-12

## 2016-05-25 MED ORDER — PREDNISONE 20 MG PO TABS: ORAL_TABLET | ORAL | 0 refills | Status: DC

## 2016-05-25 NOTE — Patient Instructions (Addendum)
Upper Respiratory infection History and exam today are suggestive of viral infection most likely due to upper respiratory infection. I wonder with her being slightly short of breath if this could be a mild bronchitis as well. Fortunately she appears in no distress and oxygen saturations normal. Symptomatic treatment with: prednisone and albuterol inhaler  We discussed that we did not find any infection that had higher probability of being bacterial such as pneumonia or strep throat or ear infection of bacterial sinus infection. Even most bronchitis is viral likely 95%. We discussed signs that bacterial infection may have developed particularly fever or shortness of breath. Likely course of 2 weeks. Patient is contagious and advised good handwashing and consideration of mask If going to be in public places.   Finally, we reviewed reasons to return to care including if symptoms worsen or persist or new concerns arise- once again particularly shortness of breath or fever.  I would want to get an x-ray if she still feels some shortness of breath within 10 days.   Meds ordered this encounter  Medications  . albuterol (PROVENTIL HFA;VENTOLIN HFA) 108 (90 Base) MCG/ACT inhaler    Sig: Inhale 2 puffs into the lungs every 6 (six) hours as needed for wheezing or shortness of breath.    Dispense:  1 Inhaler    Refill:  0  . predniSONE (DELTASONE) 20 MG tablet    Sig: Take 1 tablet by mouth daily for 5 days, then 1/2 tablet daily for 2 days    Dispense:  6 tablet    Refill:  0

## 2016-05-25 NOTE — Progress Notes (Signed)
PCP: Marin Olp, MD  Subjective:  Kristin Bonilla is a 66 y.o. year old very pleasant female patient who presents with Upper Respiratory infection symptoms including nasal congestion, sore throat (now gone), cough largely unproductive. Has had green drainage now clear. Has had sneezing. Allegra and flonase helping some but switched back to xyzal and flonase. Also using mucinex dm -started: 9 days ago, symptoms are worsening -sick contacts/travel/risks: denies flu exposure. Husband sick with similar symptoms -Hx of: allergies with baseline singulair and xyzal  ROS-denies fever, NVD, tooth pain. Feels mildly winded.   Pertinent Past Medical History-  Patient Active Problem List   Diagnosis Date Noted  . Leukopenia 05/29/2014    Priority: Medium  . CKD (chronic kidney disease), stage III 12/20/2013    Priority: Medium  . Meniere disease 12/20/2013    Priority: Medium  . Major depression in full remission ([place]) 06/07/2007    Priority: Medium  . Hyperlipidemia 10/25/2006    Priority: Medium  . Osteopenia 12/03/2014    Priority: Low  . Insomnia 05/29/2014    Priority: Low  . Alopecia 04/27/2009    Priority: Low  . GERD (gastroesophageal reflux disease) 01/09/2008    Priority: Low  . Irritable bowel syndrome 01/09/2008    Priority: Low    Medications- reviewed  Current Outpatient Prescriptions  Medication Sig Dispense Refill  . acetaminophen (TYLENOL) 500 MG tablet Take 1,000 mg by mouth daily as needed for mild pain or headache.    Marland Kitchen aspirin EC 81 MG tablet Take 81 mg by mouth daily.    . Biotin 5000 MCG CAPS Take 5,000 mcg by mouth daily.    . Calcium Carbonate-Vit D-Min (CALCIUM 1200 PO) Take 1 tablet by mouth 2 (two) times daily.     Marland Kitchen desvenlafaxine (PRISTIQ) 50 MG 24 hr tablet Take 1 tablet (50 mg total) by mouth daily. 90 tablet 3  . fenofibrate 160 MG tablet Take 1 tablet (160 mg total) by mouth daily. 90 tablet 3  . KRILL OIL PO Take 1 tablet by mouth daily.    Marland Kitchen  levocetirizine (XYZAL) 5 MG tablet Take 1 tablet (5 mg total) by mouth every evening. 90 tablet 3  . montelukast (SINGULAIR) 10 MG tablet Take 1 tablet (10 mg total) by mouth at bedtime. 90 tablet 3  . Multiple Vitamin (MULTIVITAMIN) tablet Take 1 tablet by mouth daily.    Marland Kitchen omeprazole (PRILOSEC) 40 MG capsule Take 1 capsule (40 mg total) by mouth daily. 90 capsule 3  . polyethylene glycol powder (MIRALAX) powder Take 17 g by mouth daily as needed for mild constipation.     . Polyvinyl Alcohol-Povidone (REFRESH OP) Apply 1 drop to eye daily as needed (dry eye). Reported on 01/23/2015    . Probiotic Product (PROBIOTIC ADVANCED PO) Take by mouth daily.    . tizanidine (ZANAFLEX) 2 MG capsule Take 1 capsule (2 mg total) by mouth 3 (three) times daily as needed for muscle spasms. 30 capsule 0  . traZODone (DESYREL) 100 MG tablet Take 1 tablet (100 mg total) by mouth at bedtime. 90 tablet 3   Objective: BP 112/80 (BP Location: Left Arm, Patient Position: Sitting, Cuff Size: Normal)   Pulse 82   Temp 98.1 F (36.7 C) (Oral)   Ht 5' 1.5" (1.562 m)   Wt 159 lb (72.1 kg)   SpO2 95%   BMI 29.56 kg/m  Gen: NAD, resting comfortably HEENT: Turbinates erythematous with clear drainage, TM normal, pharynx mildly erythematous with no  tonsilar exudate or edema, no sinus tenderness CV: RRR no murmurs rubs or gallops Lungs: CTAB no crackles, wheeze, rhonchi Ext: no edema Skin: warm, dry, no rash  Assessment/Plan:  Upper Respiratory infection History and exam today are suggestive of viral infection most likely due to upper respiratory infection. I wonder with her being slightly short of breath if this could be a mild bronchitis as well. Fortunately she appears in no distress and oxygen saturations normal. Symptomatic treatment with: prednisone and albuterol inhaler  We discussed that we did not find any infection that had higher probability of being bacterial such as pneumonia or strep throat or ear  infection of bacterial sinus infection. Even most bronchitis is viral likely 95%. We discussed signs that bacterial infection may have developed particularly fever or shortness of breath. Likely course of 2 weeks. Patient is contagious and advised good handwashing and consideration of mask If going to be in public places.   Finally, we reviewed reasons to return to care including if symptoms worsen or persist or new concerns arise- once again particularly shortness of breath or fever. I would want to get an x-ray if she still feels some shortness of breath within 10 days.   Meds ordered this encounter  Medications  . albuterol (PROVENTIL HFA;VENTOLIN HFA) 108 (90 Base) MCG/ACT inhaler    Sig: Inhale 2 puffs into the lungs every 6 (six) hours as needed for wheezing or shortness of breath.    Dispense:  1 Inhaler    Refill:  0  . predniSONE (DELTASONE) 20 MG tablet    Sig: Take 1 tablet by mouth daily for 5 days, then 1/2 tablet daily for 2 days    Dispense:  6 tablet    Refill:  0    Garret Reddish, MD

## 2016-05-31 ENCOUNTER — Other Ambulatory Visit: Payer: Medicare Other

## 2016-06-07 ENCOUNTER — Encounter: Payer: Medicare Other | Admitting: Family Medicine

## 2016-07-12 ENCOUNTER — Encounter: Payer: Self-pay | Admitting: Family Medicine

## 2016-07-12 ENCOUNTER — Other Ambulatory Visit: Payer: Self-pay

## 2016-07-12 ENCOUNTER — Ambulatory Visit (INDEPENDENT_AMBULATORY_CARE_PROVIDER_SITE_OTHER): Payer: Medicare Other | Admitting: Family Medicine

## 2016-07-12 VITALS — BP 102/72 | HR 82 | Temp 98.5°F | Ht 61.5 in | Wt 164.0 lb

## 2016-07-12 DIAGNOSIS — E785 Hyperlipidemia, unspecified: Secondary | ICD-10-CM

## 2016-07-12 DIAGNOSIS — M8580 Other specified disorders of bone density and structure, unspecified site: Secondary | ICD-10-CM

## 2016-07-12 DIAGNOSIS — F325 Major depressive disorder, single episode, in full remission: Secondary | ICD-10-CM | POA: Diagnosis not present

## 2016-07-12 DIAGNOSIS — Z0000 Encounter for general adult medical examination without abnormal findings: Secondary | ICD-10-CM | POA: Diagnosis not present

## 2016-07-12 DIAGNOSIS — H8101 Meniere's disease, right ear: Secondary | ICD-10-CM

## 2016-07-12 DIAGNOSIS — N183 Chronic kidney disease, stage 3 unspecified: Secondary | ICD-10-CM

## 2016-07-12 DIAGNOSIS — D72819 Decreased white blood cell count, unspecified: Secondary | ICD-10-CM

## 2016-07-12 LAB — LIPID PANEL
CHOLESTEROL: 193 mg/dL (ref 0–200)
HDL: 71.9 mg/dL (ref >39.00)
LDL Cholesterol: 106 mg/dL — ABNORMAL HIGH (ref 0–99)
NonHDL: 121.53
Total CHOL/HDL Ratio: 3
Triglycerides: 78 mg/dL (ref 0.0–149.0)
VLDL: 15.6 mg/dL (ref 0.0–40.0)

## 2016-07-12 LAB — COMPREHENSIVE METABOLIC PANEL
ALK PHOS: 62 U/L (ref 39–117)
ALT: 18 U/L (ref 0–35)
AST: 22 U/L (ref 0–37)
Albumin: 4.3 g/dL (ref 3.5–5.2)
BILIRUBIN TOTAL: 0.4 mg/dL (ref 0.2–1.2)
BUN: 15 mg/dL (ref 6–23)
CO2: 31 mEq/L (ref 19–32)
CREATININE: 0.96 mg/dL (ref 0.40–1.20)
Calcium: 10.2 mg/dL (ref 8.4–10.5)
Chloride: 106 mEq/L (ref 96–112)
GFR: 61.61 mL/min (ref >60.00)
GLUCOSE: 97 mg/dL (ref 70–99)
Potassium: 4.2 mEq/L (ref 3.5–5.1)
SODIUM: 143 meq/L (ref 135–145)
TOTAL PROTEIN: 6.2 g/dL (ref 6.0–8.3)

## 2016-07-12 LAB — CBC WITH DIFFERENTIAL/PLATELET
BASOS ABS: 0.1 10*3/uL (ref 0.0–0.1)
Basophils Relative: 2.8 % (ref 0.0–3.0)
EOS ABS: 0.2 10*3/uL (ref 0.0–0.7)
Eosinophils Relative: 5.6 % — ABNORMAL HIGH (ref 0.0–5.0)
HCT: 38.5 % (ref 36.0–46.0)
Hemoglobin: 12.9 g/dL (ref 12.0–15.0)
LYMPHS ABS: 1.3 10*3/uL (ref 0.7–4.0)
Lymphocytes Relative: 42.9 % (ref 12.0–46.0)
MCHC: 33.5 g/dL (ref 30.0–36.0)
MCV: 89.1 fl (ref 78.0–100.0)
MONO ABS: 0.4 10*3/uL (ref 0.1–1.0)
Monocytes Relative: 11.6 % (ref 3.0–12.0)
Neutro Abs: 1.2 10*3/uL — ABNORMAL LOW (ref 1.4–7.7)
Neutrophils Relative %: 37.1 % — ABNORMAL LOW (ref 43.0–77.0)
Platelets: 261 10*3/uL (ref 150.0–400.0)
RBC: 4.32 Mil/uL (ref 3.87–5.11)
RDW: 14.3 % (ref 11.5–15.5)
WBC: 3.1 10*3/uL — ABNORMAL LOW (ref 4.0–10.5)

## 2016-07-12 MED ORDER — OMEPRAZOLE 40 MG PO CPDR: 40.0000 mg | DELAYED_RELEASE_CAPSULE | Freq: Every day | ORAL | 3 refills | Status: DC

## 2016-07-12 NOTE — Assessment & Plan Note (Signed)
Osteopenia- on calcium and vitamin D. Repeat 2021 planned.

## 2016-07-12 NOTE — Assessment & Plan Note (Signed)
CKD III- avoid snsaids. GFR between 49-55 for most part- update today

## 2016-07-12 NOTE — Assessment & Plan Note (Signed)
osteopenia/GERD- we discussed agranulocytosis risk with PPI and with leukopenia would prefer for her to be off. Also has osteopenia so better to be off PPI. Trial zantac 150mg  BID but failed this. Rare breakthrough on 40mg  - continue current dose

## 2016-07-12 NOTE — Assessment & Plan Note (Signed)
HLD- update lipids- mild poor control on fenofibrate and fish oil. Last LDL 118.  Has been eating poorly and likely can reverse this but states motivation low- we tried to focus on benefits to future self

## 2016-07-12 NOTE — Assessment & Plan Note (Signed)
Meniere disease- chronic right sided hearing loss- no recent vertigo

## 2016-07-12 NOTE — Assessment & Plan Note (Signed)
Depression- controlled on pristiq with PHQ2 of 0. Some recent stressors with husbands surgery and the aging process and upcoming shoulder surgery for husband. We discussed counselor may help her deal with the stressors.

## 2016-07-12 NOTE — Patient Instructions (Addendum)
Sign release of information at the check out desk for last mammogram  Please stop by lab before you go  ______________________________________________________________________  Starting October 1st 2018, I will be transferring to our new location: [facility] (corner of [address] from [employer]) [place], [place] Phone: [phone]  I would love to have you remain my patient at this new location as long as it remains convenient for you. I am excited about the opportunity to have x-ray and sports medicine in the new building but will really miss the awesome staff and physicians at [facility]. Continue to schedule appointments at [facility] and we will automatically transfer them to the [place] location starting October 1st.

## 2016-07-12 NOTE — Progress Notes (Addendum)
Phone: [phone]  Subjective:  Patient presents today for their annual physical. Chief complaint-noted.   See problem oriented charting- ROS- full  review of systems was completed and negative including No chest pain or shortness of breath (other than with stuffiness from humidity). No blurry vision. Long term hearing loss. No new headaches  The following were reviewed and entered/updated in epic: Past Medical History:  Diagnosis Date  . Arthritis   . GERD (gastroesophageal reflux disease)   . Hyperlipidemia   . 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 Meniere disease   . Menopause    Patient Active Problem List   Diagnosis Date Noted  . Leukopenia 05/29/2014    Priority: Medium  . CKD (chronic kidney disease), stage III 12/20/2013    Priority: Medium  . Meniere disease 12/20/2013    Priority: Medium  . Major depression in full remission ([place]) 06/07/2007    Priority: Medium  . Hyperlipidemia 10/25/2006    Priority: Medium  . Osteopenia 12/03/2014    Priority: Low  . Insomnia 05/29/2014    Priority: Low  . Alopecia 04/27/2009    Priority: Low  . GERD (gastroesophageal reflux disease) 01/09/2008    Priority: Low  . Irritable bowel syndrome 01/09/2008    Priority: Low   No past surgical history on file.  Family History  Problem Relation Age of Onset  . Cancer Father        melanoma  . Heart disease Father        Died at 39  . COPD Mother   . Osteoporosis Mother   . Heart disease Unknown        died 82, around age 57-CABG, smoker    Medications- reviewed and updated Current Outpatient Prescriptions  Medication Sig Dispense Refill  . acetaminophen (TYLENOL) 500 MG tablet Take 1,000 mg by mouth daily as needed for mild pain or headache.    Marland Kitchen aspirin EC 81 MG tablet Take 81 mg by mouth daily.    . Biotin 5000 MCG CAPS Take 5,000 mcg by mouth daily.    . Calcium Carbonate-Vit D-Min (CALCIUM 1200 PO) Take 1 tablet by mouth 2 (two) times daily.     Marland Kitchen  desvenlafaxine (PRISTIQ) 50 MG 24 hr tablet Take 1 tablet (50 mg total) by mouth daily. 90 tablet 3  . fenofibrate 160 MG tablet Take 1 tablet (160 mg total) by mouth daily. 90 tablet 3  . KRILL OIL PO Take 1 tablet by mouth daily.    Marland Kitchen levocetirizine (XYZAL) 5 MG tablet Take 1 tablet (5 mg total) by mouth every evening. 90 tablet 3  . montelukast (SINGULAIR) 10 MG tablet Take 1 tablet (10 mg total) by mouth at bedtime. 90 tablet 3  . Multiple Vitamin (MULTIVITAMIN) tablet Take 1 tablet by mouth daily.    Marland Kitchen omeprazole (PRILOSEC) 40 MG capsule Take 1 capsule (40 mg total) by mouth daily. 90 capsule 3  . polyethylene glycol powder (MIRALAX) powder Take 17 g by mouth daily as needed for mild constipation.     . Probiotic Product (PROBIOTIC ADVANCED PO) Take by mouth daily.    . traZODone (DESYREL) 100 MG tablet Take 1 tablet (100 mg total) by mouth at bedtime. 90 tablet 3   No current facility-administered medications for this visit.     Allergies-reviewed and updated Allergies  Allergen Reactions  . Erythromycin Swelling  . Sulfonamide Derivatives Nausea Only    Social History   Social History  . Marital status: Married  Spouse name: N/A  . Number of children: N/A  . Years of education: N/A   Social History Main Topics  . Smoking status: Never Smoker  . Smokeless tobacco: Never Used  . Alcohol use 0.0 oz/week     Comment: wine every few months  . Drug use: No  . Sexual activity: Yes   Other Topics Concern  . None   Social History Narrative   Married. 2 children (son is special needs-in group home living). 1 grandchild      Retired from [employer] different grades      Hobbies: reading, ipad, former bowling a lot       Objective: BP 102/72 (BP Location: Left Arm, Patient Position: Sitting, Cuff Size: Large)   Pulse 82   Temp 98.5 F (36.9 C) (Oral)   Ht 5' 1.5" (1.562 m)   Wt 164 lb (74.4 kg)   SpO2 97%   BMI 30.49 kg/m  Gen: NAD, resting  comfortably HEENT: Mucous membranes are moist. Oropharynx normal Neck: no thyromegaly CV: RRR no murmurs rubs or gallops Lungs: CTAB no crackles, wheeze, rhonchi Abdomen: soft/nontender/nondistended/normal bowel sounds. No rebound or guarding.  Ext: no edema Skin: warm, dry Neuro: grossly normal, moves all extremities, PERRLA  Assessment/Plan:  66 y.o. female presenting for annual physical.  Health Maintenance counseling: 1. Anticipatory guidance: Patient counseled regarding regular dental exams - q6 months, eye exams - yearly, wearing seatbelts.  2. Risk factor reduction:  Advised patient of need for regular exercise and diet rich and fruits and vegetables to reduce risk of heart attack and stroke. Exercise- advised regular exercise- also great for stress. Diet-Weight largely stable over last 6 months. Has been eating her feelings- agrees to work on turning this around.  Wt Readings from Last 3 Encounters:  07/12/16 164 lb (74.4 kg)  05/25/16 159 lb (72.1 kg)  01/19/16 163 lb 3.2 oz (74 kg)  3. Immunizations/screenings/ancillary studies- declines shingrix until next year with shortage issues Immunization History  Administered Date(s) Administered  . Influenza Split 11/26/2010, 12/12/2011  . Influenza Whole 10/18/2006, 11/10/2008, 11/24/2009  . Influenza, High Dose Seasonal PF 11/27/2015  . Influenza,inj,Quad PF,36+ Mos 12/12/2012, 10/29/2013, 12/03/2014  . Pneumococcal Conjugate-13 12/03/2014  . Pneumococcal Polysaccharide-23 12/12/2011  . Td 01/18/2000  . Tdap 11/26/2010  . Zoster 04/09/2012  4. Cervical cancer screening- aged out per guidelines but still getting yearly exams  with [facility] 5. Breast cancer screening-  breast exam through GYN and mammogram within a year- need to get records  6. Colon cancer screening - 03/12/12 with 5 year follow up 7. Skin cancer screening- Dr. Willette Pa yearly  Status of chronic or acute concerns   Insomnia- using  trazodone  Allergies- on xyzal and singulair. Albuterol helped for recent URI. Apparently allergies may contribute to her leukopenia as well  Meniere disease Meniere disease- chronic right sided hearing loss- no recent vertigo  Major depression in full remission ([place]) Depression- controlled on pristiq with PHQ2 of 0. Some recent stressors with husbands surgery and the aging process and upcoming shoulder surgery for husband. We discussed counselor may help her deal with the stressors.    Leukopenia osteopenia/GERD- we discussed agranulocytosis risk with PPI and with leukopenia would prefer for her to be off. Also has osteopenia so better to be off PPI. Trial zantac 150mg  BID but failed this. Rare breakthrough on 40mg  - continue current dose  Hyperlipidemia HLD- update lipids- mild poor control on fenofibrate and fish oil. Last LDL  118.  Has been eating poorly and likely can reverse this but states motivation low- we tried to focus on benefits to future self  CKD (chronic kidney disease), stage III CKD III- avoid snsaids. GFR between 49-55 for most part- update today  Osteopenia Osteopenia- on calcium and vitamin D. Repeat 2021 planned.    Return in about 3 months (around 10/12/2016) for to check on your stressors and weight/stress eating. If you decline- 6 months. Pink slip for AWV given Orders Placed This Encounter  Procedures  . Comprehensive metabolic panel    [place]    Order Specific Question:   Has the patient fasted?    Answer:   No  . CBC with Differential/Platelet  . Lipid panel        Order Specific Question:   Has the patient fasted?    Answer:   No   Return precautions advised.   Garret Reddish, MD   -

## 2016-07-15 NOTE — Progress Notes (Signed)
Subjective:   Kristin Bonilla is a 66 y.o. female who presents for an Initial Medicare Annual Wellness Visit.  Review of Systems    No ROS.  Medicare Wellness Visit. Additional risk factors are reflected in the social history.  Cardiac Risk Factors include: advanced age (>37men, >52 women);dyslipidemia;family history of premature cardiovascular disease   Sleep patterns: Sleeps 8 hours, feels rested. Up to void x 1.  Home Safety/Smoke Alarms: Feels safe in home. Smoke alarms in place.  Living environment; residence and Firearm Safety: Lives with husband in 1 story home.  Seat Belt Safety/Bike Helmet: Wears seat belt.   Counseling:   Eye Exam-Last exam 04/2016, yearly. Dr. Kathlen Mody Dental-Last exam 04/2016, every 6 months. Dr. Scherrie Gerlach  Female:   Pap- 2016     Mammo-03/2016, pt reports normal. Dr. Helane Rima (pt signed release of records this week to have result faxed)       Dexa scan-Last 2011. Order placed.      [facility]-Colonoscopy 03/12/2012, polyp. Recall 5 years.      Objective:    Today's Vitals   07/18/16 1506  BP: (!) 110/58  Pulse: 93  SpO2: 97%  Weight: 162 lb 12.8 oz (73.8 kg)  Height: 5\' 2"  (1.575 m)  PainSc: 1    Body mass index is 29.78 kg/m.   Current Medications (verified) Outpatient Encounter Prescriptions as of 07/18/2016  Medication Sig  . acetaminophen (TYLENOL) 500 MG tablet Take 1,000 mg by mouth daily as needed for mild pain or headache.  Marland Kitchen aspirin EC 81 MG tablet Take 81 mg by mouth daily.  . Biotin 5000 MCG CAPS Take 5,000 mcg by mouth daily.  . Calcium Carbonate-Vit D-Min (CALCIUM 1200 PO) Take 1 tablet by mouth 2 (two) times daily.   Marland Kitchen desvenlafaxine (PRISTIQ) 50 MG 24 hr tablet Take 1 tablet (50 mg total) by mouth daily.  . fenofibrate 160 MG tablet Take 1 tablet (160 mg total) by mouth daily.  . fluticasone (VERAMYST) 27.5 MCG/SPRAY nasal spray Place 2 sprays into the nose daily.  Marland Kitchen KRILL OIL PO Take 1 tablet by mouth daily.  Marland Kitchen levocetirizine (XYZAL)  5 MG tablet Take 1 tablet (5 mg total) by mouth every evening.  . montelukast (SINGULAIR) 10 MG tablet Take 1 tablet (10 mg total) by mouth at bedtime.  . Multiple Vitamin (MULTIVITAMIN) tablet Take 1 tablet by mouth daily.  Marland Kitchen omeprazole (PRILOSEC) 40 MG capsule Take 1 capsule (40 mg total) by mouth daily.  . polyethylene glycol powder (MIRALAX) powder Take 17 g by mouth daily as needed for mild constipation.   . Probiotic Product (PROBIOTIC ADVANCED PO) Take by mouth daily.  . traZODone (DESYREL) 100 MG tablet Take 1 tablet (100 mg total) by mouth at bedtime.   No facility-administered encounter medications on file as of 07/18/2016.     Allergies (verified) Erythromycin and Sulfonamide derivatives   History: Past Medical History:  Diagnosis Date  . Arthritis   . GERD (gastroesophageal reflux disease)   . Hyperlipidemia   . 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 Meniere disease   . Menopause    History reviewed. No pertinent surgical history. Family History  Problem Relation Age of Onset  . Cancer Father        melanoma  . Heart disease Father        Died at 58  . COPD Mother   . Osteoporosis Mother   . Heart disease Unknown        died 34, around  age 37-CABG, smoker   Social History   Occupational History  . Not on file.   Social History Main Topics  . Smoking status: Never Smoker  . Smokeless tobacco: Never Used  . Alcohol use No  . Drug use: No  . Sexual activity: Yes    Tobacco Counseling Counseling given: Not Answered   Activities of Daily Living In your present state of health, do you have any difficulty performing the following activities: 07/18/2016  Hearing? N  Vision? N  Difficulty concentrating or making decisions? N  Walking or climbing stairs? N  Dressing or bathing? N  Doing errands, shopping? N  Preparing Food and eating ? N  Using the Toilet? N  In the past six months, have you accidently leaked urine? N  Do you have problems with  loss of bowel control? N  Managing your Medications? N  Managing your Finances? N  Housekeeping or managing your Housekeeping? N  Some recent data might be hidden    Immunizations and Health Maintenance Immunization History  Administered Date(s) Administered  . Influenza Split 11/26/2010, 12/12/2011  . Influenza Whole 10/18/2006, 11/10/2008, 11/24/2009  . Influenza, High Dose Seasonal PF 11/27/2015  . Influenza,inj,Quad PF,36+ Mos 12/12/2012, 10/29/2013, 12/03/2014  . Pneumococcal Conjugate-13 12/03/2014  . Pneumococcal Polysaccharide-23 12/12/2011  . Td 01/18/2000  . Tdap 11/26/2010  . Zoster 04/09/2012   Health Maintenance Due  Topic Date Due  . MAMMOGRAM  02/22/2016    Patient Care Team: Marin Olp, MD as PCP - General (Family Medicine) Dian Queen, MD as Consulting Physician (Obstetrics and Gynecology) Erlene Quan [place] Calix, MD as Referring Physician (Internal Medicine) Warren Danes, PA-C as Physician Assistant (Dermatology)  Indicate any recent Medical Services you may have received from other than [facility] providers in the past year (date may be approximate).     Assessment:   This is a routine wellness examination for [facility]. Physical assessment deferred to PCP.   Hearing/Vision screen Hearing Screening Comments: Lost hearing in right ear, followed by Audiology at [facility] Screening Comments: Wears glasses for reading.   Dietary issues and exercise activities discussed: Current Exercise Habits: Home exercise routine, Type of exercise: Other - see comments (aerobic), Time (Minutes): 15, Frequency (Times/Week): 1, Weekly Exercise (Minutes/Week): 15, Exercise limited by: None identified   Diet (meal preparation, eat out, water intake, caffeinated beverages, dairy products, fruits and vegetables):   Breakfast: salad with protein, grits Lunch: sandwich Dinner: vegetables, protein  Discussed heart healthy diet and increasing activity.   Goals     . Weight (lb) < 150 lb (68 kg)          Lose weight by following diet provided by nutritionist.       Depression Screen PHQ 2/9 Scores 07/18/2016 07/12/2016 06/04/2015 12/03/2014 04/03/2013 09/24/2012  PHQ - 2 Score 1 0 0 0 0 2  PHQ- 9 Score - - - - - 9    Fall Risk Fall Risk  07/18/2016 07/12/2016 06/04/2015 12/03/2014 04/03/2013  Falls in the past year? No No No No No    Cognitive Function:       Ad8 score reviewed for issues:  Issues making decisions: no  Less interest in hobbies / activities: no  Repeats questions, stories (family complaining): no  Trouble using ordinary gadgets (microwave, computer, phone): no  Forgets the month or year: no  Mismanaging finances: no  Remembering appts: no  Daily problems with thinking and/or memory: no Ad8 score is=0     Screening  Tests Health Maintenance  Topic Date Due  . MAMMOGRAM  02/22/2016  . INFLUENZA VACCINE  08/17/2016  . PNA vac Low Risk Adult (2 of 2 - PPSV23) 12/11/2016  . COLONOSCOPY  03/12/2017  . TETANUS/TDAP  11/25/2020  . DEXA SCAN  Completed  . Hepatitis C Screening  Completed      Plan:    Bring a copy of your advance directives to your next office visit.  Schedule bone scan.   Continue doing brain stimulating activities (puzzles, reading, adult coloring books, staying active) to keep memory sharp.   I have personally reviewed and noted the following in the patient's chart:   . Medical and social history . Use of alcohol, tobacco or illicit drugs  . Current medications and supplements . Functional ability and status . Nutritional status . Physical activity . Advanced directives . List of other physicians . Hospitalizations, surgeries, and ER visits in previous 12 months . Vitals . Screenings to include cognitive, depression, and falls . Referrals and appointments  In addition, I have reviewed and discussed with patient certain preventive protocols, quality metrics, and best practice  recommendations. A written personalized care plan for preventive services as well as general preventive health recommendations were provided to patient.     Gerilyn Nestle, RN   07/18/2016

## 2016-07-18 ENCOUNTER — Ambulatory Visit (INDEPENDENT_AMBULATORY_CARE_PROVIDER_SITE_OTHER): Payer: Medicare Other

## 2016-07-18 VITALS — BP 110/58 | HR 93 | Ht 62.0 in | Wt 162.8 lb

## 2016-07-18 DIAGNOSIS — Z0000 Encounter for general adult medical examination without abnormal findings: Secondary | ICD-10-CM | POA: Diagnosis not present

## 2016-07-18 DIAGNOSIS — E2839 Other primary ovarian failure: Secondary | ICD-10-CM | POA: Diagnosis not present

## 2016-07-18 NOTE — Progress Notes (Signed)
I have reviewed and agree with note, evaluation, plan.   Chia Rock, MD  

## 2016-07-18 NOTE — Patient Instructions (Addendum)
Bring a copy of your advance directives to your next office visit.  Schedule bone scan.   Continue doing brain stimulating activities (puzzles, reading, adult coloring books, staying active) to keep memory sharp.    Fall Prevention in the Home Falls can cause injuries. They can happen to people of all ages. There are many things you can do to make your home safe and to help prevent falls. What can I do on the outside of my home?  Regularly fix the edges of walkways and driveways and fix any cracks.  Remove anything that might make you trip as you walk through a door, such as a raised step or threshold.  Trim any bushes or trees on the path to your home.  Use bright outdoor lighting.  Clear any walking paths of anything that might make someone trip, such as rocks or tools.  Regularly check to see if handrails are loose or broken. Make sure that both sides of any steps have handrails.  Any raised decks and porches should have guardrails on the edges.  Have any leaves, snow, or ice cleared regularly.  Use sand or salt on walking paths during winter.  Clean up any spills in your garage right away. This includes oil or grease spills. What can I do in the bathroom?  Use night lights.  Install grab bars by the toilet and in the tub and shower. Do not use towel bars as grab bars.  Use non-skid mats or decals in the tub or shower.  If you need to sit down in the shower, use a plastic, non-slip stool.  Keep the floor dry. Clean up any water that spills on the floor as soon as it happens.  Remove soap buildup in the tub or shower regularly.  Attach bath mats securely with double-sided non-slip rug tape.  Do not have throw rugs and other things on the floor that can make you trip. What can I do in the bedroom?  Use night lights.  Make sure that you have a light by your bed that is easy to reach.  Do not use any sheets or blankets that are too big for your bed. They should not  hang down onto the floor.  Have a firm chair that has side arms. You can use this for support while you get dressed.  Do not have throw rugs and other things on the floor that can make you trip. What can I do in the kitchen?  Clean up any spills right away.  Avoid walking on wet floors.  Keep items that you use a lot in easy-to-reach places.  If you need to reach something above you, use a strong step stool that has a grab bar.  Keep electrical cords out of the way.  Do not use floor polish or wax that makes floors slippery. If you must use wax, use non-skid floor wax.  Do not have throw rugs and other things on the floor that can make you trip. What can I do with my stairs?  Do not leave any items on the stairs.  Make sure that there are handrails on both sides of the stairs and use them. Fix handrails that are broken or loose. Make sure that handrails are as long as the stairways.  Check any carpeting to make sure that it is firmly attached to the stairs. Fix any carpet that is loose or worn.  Avoid having throw rugs at the top or bottom of the stairs. If  you do have throw rugs, attach them to the floor with carpet tape.  Make sure that you have a light switch at the top of the stairs and the bottom of the stairs. If you do not have them, ask someone to add them for you. What else can I do to help prevent falls?  Wear shoes that: ? Do not have high heels. ? Have rubber bottoms. ? Are comfortable and fit you well. ? Are closed at the toe. Do not wear sandals.  If you use a stepladder: ? Make sure that it is fully opened. Do not climb a closed stepladder. ? Make sure that both sides of the stepladder are locked into place. ? Ask someone to hold it for you, if possible.  Clearly mark and make sure that you can see: ? Any grab bars or handrails. ? First and last steps. ? Where the edge of each step is.  Use tools that help you move around (mobility aids) if they are  needed. These include: ? Canes. ? Walkers. ? Scooters. ? Crutches.  Turn on the lights when you go into a dark area. Replace any light bulbs as soon as they burn out.  Set up your furniture so you have a clear path. Avoid moving your furniture around.  If any of your floors are uneven, fix them.  If there are any pets around you, be aware of where they are.  Review your medicines with your doctor. Some medicines can make you feel dizzy. This can increase your chance of falling. Ask your doctor what other things that you can do to help prevent falls. This information is not intended to replace advice given to you by your health care provider. Make sure you discuss any questions you have with your health care provider. Document Released: 10/30/2008 Document Revised: 06/11/2015 Document Reviewed: 02/07/2014 Elsevier Interactive Patient Education  2018 [place] Maintenance, Female Adopting a healthy lifestyle and getting preventive care can go a long way to promote health and wellness. Talk with your health care provider about what schedule of regular examinations is right for you. This is a good chance for you to check in with your provider about disease prevention and staying healthy. In between checkups, there are plenty of things you can do on your own. Experts have done a lot of research about which lifestyle changes and preventive measures are most likely to keep you healthy. Ask your health care provider for more information. Weight and diet Eat a healthy diet  Be sure to include plenty of vegetables, fruits, low-fat dairy products, and lean protein.  Do not eat a lot of foods high in solid fats, added sugars, or salt.  Get regular exercise. This is one of the most important things you can do for your health. ? Most adults should exercise for at least 150 minutes each week. The exercise should increase your heart rate and make you sweat (moderate-intensity  exercise). ? Most adults should also do strengthening exercises at least twice a week. This is in addition to the moderate-intensity exercise.  Maintain a healthy weight  Body mass index (BMI) is a measurement that can be used to identify possible weight problems. It estimates body fat based on height and weight. Your health care provider can help determine your BMI and help you achieve or maintain a healthy weight.  For females 78 years of age and older: ? A BMI below 18.5 is considered underweight. ? A BMI of  18.5 to 24.9 is normal. ? A BMI of 25 to 29.9 is considered overweight. ? A BMI of 30 and above is considered obese.  Watch levels of cholesterol and blood lipids  You should start having your blood tested for lipids and cholesterol at 68 years of age, then have this test every 5 years.  You may need to have your cholesterol levels checked more often if: ? Your lipid or cholesterol levels are high. ? You are older than 67 years of age. ? You are at high risk for heart disease.  Cancer screening Lung Cancer  Lung cancer screening is recommended for adults 82-57 years old who are at high risk for lung cancer because of a history of smoking.  A yearly low-dose CT scan of the lungs is recommended for people who: ? Currently smoke. ? Have quit within the past 15 years. ? Have at least a 30-pack-year history of smoking. A pack year is smoking an average of one pack of cigarettes a day for 1 year.  Yearly screening should continue until it has been 15 years since you quit.  Yearly screening should stop if you develop a health problem that would prevent you from having lung cancer treatment.  Breast Cancer  Practice breast self-awareness. This means understanding how your breasts normally appear and feel.  It also means doing regular breast self-exams. Let your health care provider know about any changes, no matter how small.  If you are in your 20s or 30s, you should have a  clinical breast exam (CBE) by a health care provider every 1-3 years as part of a regular health exam.  If you are 70 or older, have a CBE every year. Also consider having a breast X-ray (mammogram) every year.  If you have a family history of breast cancer, talk to your health care provider about genetic screening.  If you are at high risk for breast cancer, talk to your health care provider about having an MRI and a mammogram every year.  Breast cancer gene (BRCA) assessment is recommended for women who have family members with BRCA-related cancers. BRCA-related cancers include: ? Breast. ? Ovarian. ? Tubal. ? Peritoneal cancers.  Results of the assessment will determine the need for genetic counseling and BRCA1 and BRCA2 testing.  Cervical Cancer Your health care provider may recommend that you be screened regularly for cancer of the pelvic organs (ovaries, uterus, and vagina). This screening involves a pelvic examination, including checking for microscopic changes to the surface of your cervix (Pap test). You may be encouraged to have this screening done every 3 years, beginning at age 8.  For women ages 48-65, health care providers may recommend pelvic exams and Pap testing every 3 years, or they may recommend the Pap and pelvic exam, combined with testing for human papilloma virus (HPV), every 5 years. Some types of HPV increase your risk of cervical cancer. Testing for HPV may also be done on women of any age with unclear Pap test results.  Other health care providers may not recommend any screening for nonpregnant women who are considered low risk for pelvic cancer and who do not have symptoms. Ask your health care provider if a screening pelvic exam is right for you.  If you have had past treatment for cervical cancer or a condition that could lead to cancer, you need Pap tests and screening for cancer for at least 20 years after your treatment. If Pap tests have been discontinued,  your  risk factors (such as having a new sexual partner) need to be reassessed to determine if screening should resume. Some women have medical problems that increase the chance of getting cervical cancer. In these cases, your health care provider may recommend more frequent screening and Pap tests.  Colorectal Cancer  This type of cancer can be detected and often prevented.  Routine colorectal cancer screening usually begins at 67 years of age and continues through 67 years of age.  Your health care provider may recommend screening at an earlier age if you have risk factors for colon cancer.  Your health care provider may also recommend using home test kits to check for hidden blood in the stool.  A small camera at the end of a tube can be used to examine your colon directly (sigmoidoscopy or colonoscopy). This is done to check for the earliest forms of colorectal cancer.  Routine screening usually begins at age 64.  Direct examination of the colon should be repeated every 5-10 years through 67 years of age. However, you may need to be screened more often if early forms of precancerous polyps or small growths are found.  Skin Cancer  Check your skin from head to toe regularly.  Tell your health care provider about any new moles or changes in moles, especially if there is a change in a mole's shape or color.  Also tell your health care provider if you have a mole that is larger than the size of a pencil eraser.  Always use sunscreen. Apply sunscreen liberally and repeatedly throughout the day.  Protect yourself by wearing long sleeves, pants, a wide-brimmed hat, and sunglasses whenever you are outside.  Heart disease, diabetes, and high blood pressure  High blood pressure causes heart disease and increases the risk of stroke. High blood pressure is more likely to develop in: ? People who have blood pressure in the high end of the normal range (130-139/85-89 mm Hg). ? People who are  overweight or obese. ? People who are African American.  If you are 54-25 years of age, have your blood pressure checked every 3-5 years. If you are 32 years of age or older, have your blood pressure checked every year. You should have your blood pressure measured twice-once when you are at a hospital or clinic, and once when you are not at a hospital or clinic. Record the average of the two measurements. To check your blood pressure when you are not at a hospital or clinic, you can use: ? An automated blood pressure machine at a pharmacy. ? A home blood pressure monitor.  If you are between 73 years and 63 years old, ask your health care provider if you should take aspirin to prevent strokes.  Have regular diabetes screenings. This involves taking a blood sample to check your fasting blood sugar level. ? If you are at a normal weight and have a low risk for diabetes, have this test once every three years after 67 years of age. ? If you are overweight and have a high risk for diabetes, consider being tested at a younger age or more often. Preventing infection Hepatitis B  If you have a higher risk for hepatitis B, you should be screened for this virus. You are considered at high risk for hepatitis B if: ? You were born in a country where hepatitis B is common. Ask your health care provider which countries are considered high risk. ? Your parents were born in a high-risk country,  and you have not been immunized against hepatitis B (hepatitis B vaccine). ? You have HIV or AIDS. ? You use needles to inject street drugs. ? You live with someone who has hepatitis B. ? You have had sex with someone who has hepatitis B. ? You get hemodialysis treatment. ? You take certain medicines for conditions, including cancer, organ transplantation, and autoimmune conditions.  Hepatitis C  Blood testing is recommended for: ? Everyone born from [birth year] through 1966. ? Anyone with known risk factors for  hepatitis C.  Sexually transmitted infections (STIs)  You should be screened for sexually transmitted infections (STIs) including gonorrhea and chlamydia if: ? You are sexually active and are younger than 67 years of age. ? You are older than 67 years of age and your health care provider tells you that you are at risk for this type of infection. ? Your sexual activity has changed since you were last screened and you are at an increased risk for chlamydia or gonorrhea. Ask your health care provider if you are at risk.  If you do not have HIV, but are at risk, it may be recommended that you take a prescription medicine daily to prevent HIV infection. This is called pre-exposure prophylaxis (PrEP). You are considered at risk if: ? You are sexually active and do not regularly use condoms or know the HIV status of your partner(s). ? You take drugs by injection. ? You are sexually active with a partner who has HIV.  Talk with your health care provider about whether you are at high risk of being infected with HIV. If you choose to begin PrEP, you should first be tested for HIV. You should then be tested every 3 months for as long as you are taking PrEP. Pregnancy  If you are premenopausal and you may become pregnant, ask your health care provider about preconception counseling.  If you may become pregnant, take 400 to 800 micrograms (mcg) of folic acid every day.  If you want to prevent pregnancy, talk to your health care provider about birth control (contraception). Osteoporosis and menopause  Osteoporosis is a disease in which the bones lose minerals and strength with aging. This can result in serious bone fractures. Your risk for osteoporosis can be identified using a bone density scan.  If you are 51 years of age or older, or if you are at risk for osteoporosis and fractures, ask your health care provider if you should be screened.  Ask your health care provider whether you should take a  calcium or vitamin D supplement to lower your risk for osteoporosis.  Menopause may have certain physical symptoms and risks.  Hormone replacement therapy may reduce some of these symptoms and risks. Talk to your health care provider about whether hormone replacement therapy is right for you. Follow these instructions at home:  Schedule regular health, dental, and eye exams.  Stay current with your immunizations.  Do not use any tobacco products including cigarettes, chewing tobacco, or electronic cigarettes.  If you are pregnant, do not drink alcohol.  If you are breastfeeding, limit how much and how often you drink alcohol.  Limit alcohol intake to no more than 1 drink per day for nonpregnant women. One drink equals 12 ounces of beer, 5 ounces of wine, or 1 ounces of hard liquor.  Do not use street drugs.  Do not share needles.  Ask your health care provider for help if you need support or information about quitting drugs.  Tell your health care provider if you often feel depressed.  Tell your health care provider if you have ever been abused or do not feel safe at home. This information is not intended to replace advice given to you by your health care provider. Make sure you discuss any questions you have with your health care provider. Document Released: 07/19/2010 Document Revised: 06/11/2015 Document Reviewed: 10/07/2014 Elsevier Interactive Patient Education  [employer].

## 2016-07-27 ENCOUNTER — Ambulatory Visit: Payer: Medicare Other | Admitting: Hematology

## 2016-07-28 ENCOUNTER — Encounter: Payer: Self-pay | Admitting: Family Medicine

## 2016-11-05 ENCOUNTER — Other Ambulatory Visit: Payer: Self-pay | Admitting: Family Medicine

## 2016-11-18 ENCOUNTER — Telehealth: Payer: Self-pay | Admitting: Family Medicine

## 2016-11-18 NOTE — Telephone Encounter (Signed)
Patient's right eye is swelling w/ lump on eyelid.  Would like a call back to discuss.She is doing warm compresses.  Still using [id number], television/film set.  Ty,  -LL

## 2016-11-18 NOTE — Telephone Encounter (Signed)
Spoke with pt, she states that her right eye upper lid is swollen and this morning it was crusty. I explained that she needs to be seen to determine if antibiotics are needed. I offered [facility] and MyChart evisit. Pt was unsure about scheduling a visit. I gave her the phone# and address to [facility]. Pt will call Monday if she has not been seen and her eye is still the same/worse.

## 2016-11-18 NOTE — Telephone Encounter (Signed)
noted thanks  

## 2016-11-19 ENCOUNTER — Encounter: Payer: Self-pay | Admitting: Family Medicine

## 2016-11-19 ENCOUNTER — Ambulatory Visit (INDEPENDENT_AMBULATORY_CARE_PROVIDER_SITE_OTHER): Payer: Medicare Other | Admitting: Family Medicine

## 2016-11-19 VITALS — BP 116/74 | HR 71 | Temp 97.9°F | Ht 61.5 in | Wt 166.0 lb

## 2016-11-19 DIAGNOSIS — H00011 Hordeolum externum right upper eyelid: Secondary | ICD-10-CM | POA: Diagnosis not present

## 2016-11-19 MED ORDER — CEPHALEXIN 500 MG PO CAPS: 500.0000 mg | ORAL_CAPSULE | Freq: Three times a day (TID) | ORAL | 0 refills | Status: DC

## 2016-11-19 NOTE — Progress Notes (Signed)
[name] [facility] [address] [place], [place] [address] [phone]  Date:  11/19/2016   Name:  Kristin Bonilla   DOB:  1950-05-21   MRN:  [id number]  PCP:  Marin Olp, MD    Chief Complaint: No chief complaint on file.   History of Present Illness:  Kristin Bonilla is a 66 y.o. very pleasant female patient who presents with the following:  History of IBD, dyslipidemia, CKD Here today with concern of stye on her right upper lid It has been building for a couple of days- got worse again yesterday It hurts if she presses on it at all She is also feels tender at the right TMJ No fever noted  She wears only Her vision is othersie normal except for the mass in her upper vision No known injury to her eye   She does not see nephrology at this time    Patient Active Problem List   Diagnosis Date Noted  . Osteopenia 12/03/2014  . Leukopenia 05/29/2014  . Insomnia 05/29/2014  . CKD (chronic kidney disease), stage III ([place]) 12/20/2013  . Meniere disease 12/20/2013  . Alopecia 04/27/2009  . GERD (gastroesophageal reflux disease) 01/09/2008  . Irritable bowel syndrome 01/09/2008  . Major depression in full remission ([place]) 06/07/2007  . Hyperlipidemia 10/25/2006    Past Medical History:  Diagnosis Date  . Arthritis   . GERD (gastroesophageal reflux disease)   . Hyperlipidemia   . 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 Meniere disease   . Menopause     No past surgical history on file.  Social History  Substance Use Topics  . Smoking status: Never Smoker  . Smokeless tobacco: Never Used  . Alcohol use No    Family History  Problem Relation Age of Onset  . Cancer Father        melanoma  . Heart disease Father        Died at 78  . COPD Mother   . Osteoporosis Mother   . Heart disease Unknown        died 29, around age 24-CABG, smoker    Allergies  Allergen Reactions  . Erythromycin  Swelling  . Sulfonamide Derivatives Nausea Only    Medication list has been reviewed and updated.  Current Outpatient Prescriptions on File Prior to Visit  Medication Sig Dispense Refill  . acetaminophen (TYLENOL) 500 MG tablet Take 1,000 mg by mouth daily as needed for mild pain or headache.    Marland Kitchen aspirin EC 81 MG tablet Take 81 mg by mouth daily.    . Biotin 5000 MCG CAPS Take 5,000 mcg by mouth daily.    . Calcium Carbonate-Vit D-Min (CALCIUM 1200 PO) Take 1 tablet by mouth 2 (two) times daily.     Marland Kitchen desvenlafaxine (PRISTIQ) 50 MG 24 hr tablet TAKE 1 TABLET BY MOUTH  DAILY 90 tablet 1  . fenofibrate 160 MG tablet TAKE 1 TABLET BY MOUTH  DAILY 90 tablet 1  . fluticasone (VERAMYST) 27.5 MCG/SPRAY nasal spray Place 2 sprays into the nose daily.    Marland Kitchen KRILL OIL PO Take 1 tablet by mouth daily.    Marland Kitchen levocetirizine (XYZAL) 5 MG tablet Take 1 tablet (5 mg total) by mouth every evening. 90 tablet 3  . montelukast (SINGULAIR) 10 MG tablet TAKE 1 TABLET BY MOUTH AT  BEDTIME 90 tablet 1  . Multiple Vitamin (MULTIVITAMIN) tablet Take 1 tablet by  mouth daily.    Marland Kitchen omeprazole (PRILOSEC) 40 MG capsule Take 1 capsule (40 mg total) by mouth daily. 90 capsule 3  . polyethylene glycol powder (MIRALAX) powder Take 17 g by mouth daily as needed for mild constipation.     . Probiotic Product (PROBIOTIC ADVANCED PO) Take by mouth daily.    . traZODone (DESYREL) 100 MG tablet TAKE 1 TABLET BY MOUTH AT  BEDTIME 90 tablet 1  . [DISCONTINUED] cetirizine (ZYRTEC) 10 MG tablet Take 10 mg by mouth daily.       No current facility-administered medications on file prior to visit.     Review of Systems:  As per HPI- otherwise negative. She did have MRSA once but this was 5 years ago No fever or chills She otherwise feels well No discharge from eye No vision change   Physical Examination: Vitals:   11/19/16 1021  BP: 116/74  Pulse: 71  Temp: 97.9 F (36.6 C)  SpO2: 98%   Vitals:   11/19/16 1021   Weight: 166 lb (75.3 kg)  Height: 5' 1.5" (1.562 m)   Body mass index is 30.86 kg/m. Ideal Body Weight: Weight in (lb) to have BMI = 25: 134.2  GEN: WDWN, NAD, Non-toxic, A & O x 3, looks well except for stye HEENT: Atraumatic, Normocephalic. Neck supple. No masses, No LAD.  Bilateral TM wnl, oropharynx normal.  PEERL,EOMI.   Stye right upper lid, nasal aspect She also has a tender right sided pre-auricular node Fundoscopic exam wnl Ears and Nose: No external deformity. CV: RRR, No M/G/R. No JVD. No thrill. No extra heart sounds. PULM: CTA B, no wheezes, crackles, rhonchi. No retractions. No resp. distress. No accessory muscle use. EXTR: No c/c/e NEURO Normal gait.  PSYCH: Normally interactive. Conversant. Not depressed or anxious appearing.  Calm demeanor. '  Assessment and Plan: Hordeolum externum of right upper eyelid - Plan: cephALEXin (KEFLEX) 500 MG capsule  Stye with associated tender LAD Continue hot compresses Treat with keflex for a week Seek care if not better in the next 2-3 days- Sooner if worse.    Signed Lamar Blinks, MD

## 2016-11-19 NOTE — Patient Instructions (Signed)
Please continue hot compresses to your right eye, and use the keflex three times a day for a week.  If the stye is not improving over the next few days please do let us know- Sooner if worse.

## 2016-12-13 ENCOUNTER — Other Ambulatory Visit: Payer: Self-pay | Admitting: Physician Assistant

## 2017-01-06 ENCOUNTER — Encounter: Payer: Self-pay | Admitting: Family Medicine

## 2017-01-06 ENCOUNTER — Ambulatory Visit: Payer: Medicare Other | Admitting: Family Medicine

## 2017-01-06 VITALS — BP 120/76 | HR 72 | Temp 98.0°F | Ht 61.5 in | Wt 164.4 lb

## 2017-01-06 DIAGNOSIS — M7062 Trochanteric bursitis, left hip: Secondary | ICD-10-CM | POA: Diagnosis not present

## 2017-01-06 DIAGNOSIS — Z23 Encounter for immunization: Secondary | ICD-10-CM

## 2017-01-06 DIAGNOSIS — N183 Chronic kidney disease, stage 3 unspecified: Secondary | ICD-10-CM

## 2017-01-06 DIAGNOSIS — F325 Major depressive disorder, single episode, in full remission: Secondary | ICD-10-CM | POA: Diagnosis not present

## 2017-01-06 LAB — CBC WITH DIFFERENTIAL/PLATELET
BASOS ABS: 0.1 10*3/uL (ref 0.0–0.1)
Basophils Relative: 2.6 % (ref 0.0–3.0)
EOS PCT: 3.8 % (ref 0.0–5.0)
Eosinophils Absolute: 0.1 10*3/uL (ref 0.0–0.7)
HCT: 41.3 % (ref 36.0–46.0)
Hemoglobin: 13.7 g/dL (ref 12.0–15.0)
LYMPHS ABS: 1.5 10*3/uL (ref 0.7–4.0)
Lymphocytes Relative: 42.5 % (ref 12.0–46.0)
MCHC: 33.1 g/dL (ref 30.0–36.0)
MCV: 91.5 fl (ref 78.0–100.0)
MONO ABS: 0.3 10*3/uL (ref 0.1–1.0)
Monocytes Relative: 9.1 % (ref 3.0–12.0)
NEUTROS ABS: 1.5 10*3/uL (ref 1.4–7.7)
NEUTROS PCT: 42 % — AB (ref 43.0–77.0)
PLATELETS: 292 10*3/uL (ref 150.0–400.0)
RBC: 4.51 Mil/uL (ref 3.87–5.11)
RDW: 13.7 % (ref 11.5–15.5)
WBC: 3.5 10*3/uL — ABNORMAL LOW (ref 4.0–10.5)

## 2017-01-06 LAB — BASIC METABOLIC PANEL
BUN: 20 mg/dL (ref 6–23)
CALCIUM: 9.8 mg/dL (ref 8.4–10.5)
CO2: 25 meq/L (ref 19–32)
Chloride: 104 mEq/L (ref 96–112)
Creatinine, Ser: 0.99 mg/dL (ref 0.40–1.20)
GFR: 59.37 mL/min — ABNORMAL LOW (ref >60.00)
Glucose, Bld: 90 mg/dL (ref 70–99)
Potassium: 4.1 mEq/L (ref 3.5–5.1)
SODIUM: 139 meq/L (ref 135–145)

## 2017-01-06 MED ORDER — PREDNISONE 20 MG PO TABS: ORAL_TABLET | ORAL | 0 refills | Status: DC

## 2017-01-06 NOTE — Assessment & Plan Note (Signed)
S: kidney function has been stable. Does a great job avoiding NSAIDs. Blood pressure looks great today. Working to get lipids controlled. Unable to get off PPI due to breakthrough A/P: update kidney function test today

## 2017-01-06 NOTE — Assessment & Plan Note (Signed)
S: pristiq continues to help her. PHQ9 of 3. Using melatonin for sleep. Still needing trazodone- but the combo really helps. No SI A/P: doing well phq2 of 1 and that is even with husband's recent surgery. Thrilled she is doing so well.

## 2017-01-06 NOTE — Patient Instructions (Addendum)
Schedule your bone density test at check out desk or with Lanny Hurst our referral coordinator  trial low dose prednisone for left trochanteric bursitis. Use ice or heat 15 minutes 3x a day may also help. If not improved, can get you in with Dr. Paulla Fore to consider injection/further workup  Please stop by lab before you go

## 2017-01-06 NOTE — Progress Notes (Signed)
Subjective:  Kristin Bonilla is a 66 y.o. year old very pleasant female patient who presents for/with See problem oriented charting ROS- left lateral hip pain. No falls. No chest pain or shortness of breath. No edema.    Past Medical History-  Patient Active Problem List   Diagnosis Date Noted  . Leukopenia 05/29/2014    Priority: Medium  . CKD (chronic kidney disease), stage III ([place]) 12/20/2013    Priority: Medium  . Meniere disease 12/20/2013    Priority: Medium  . Major depression in full remission ([place]) 06/07/2007    Priority: Medium  . Hyperlipidemia 10/25/2006    Priority: Medium  . Osteopenia 12/03/2014    Priority: Low  . Insomnia 05/29/2014    Priority: Low  . Alopecia 04/27/2009    Priority: Low  . GERD (gastroesophageal reflux disease) 01/09/2008    Priority: Low  . Irritable bowel syndrome 01/09/2008    Priority: Low   Medications- reviewed and updated Current Outpatient Medications  Medication Sig Dispense Refill  . acetaminophen (TYLENOL) 500 MG tablet Take 1,000 mg by mouth daily as needed for mild pain or headache.    Marland Kitchen aspirin EC 81 MG tablet Take 81 mg by mouth daily.    . Biotin 5000 MCG CAPS Take 5,000 mcg by mouth daily.    . Calcium Carbonate-Vit D-Min (CALCIUM 1200 PO) Take 1 tablet by mouth daily.     Marland Kitchen desvenlafaxine (PRISTIQ) 50 MG 24 hr tablet TAKE 1 TABLET BY MOUTH  DAILY 90 tablet 1  . fenofibrate 160 MG tablet TAKE 1 TABLET BY MOUTH  DAILY 90 tablet 1  . fluticasone (VERAMYST) 27.5 MCG/SPRAY nasal spray Place 2 sprays into the nose daily.    Marland Kitchen KRILL OIL PO Take 1 tablet by mouth daily.    Marland Kitchen levocetirizine (XYZAL) 5 MG tablet Take 1 tablet (5 mg total) by mouth every evening. 90 tablet 3  . Melatonin 5 MG TABS Take 1 tablet by mouth at bedtime.    . montelukast (SINGULAIR) 10 MG tablet TAKE 1 TABLET BY MOUTH AT  BEDTIME 90 tablet 1  . Multiple Vitamin (MULTIVITAMIN) tablet Take 1 tablet by mouth daily.    Marland Kitchen omeprazole (PRILOSEC) 40 MG capsule  Take 1 capsule (40 mg total) by mouth daily. 90 capsule 3  . polyethylene glycol powder (MIRALAX) powder Take 17 g by mouth daily as needed for mild constipation.     . Probiotic Product (PROBIOTIC ADVANCED PO) Take by mouth daily.    . traZODone (DESYREL) 100 MG tablet TAKE 1 TABLET BY MOUTH AT  BEDTIME 90 tablet 1   Objective: BP 120/76 (BP Location: Left Arm, Patient Position: Sitting, Cuff Size: Large)   Pulse 72   Temp 98 F (36.7 C) (Oral)   Ht 5' 1.5" (1.562 m)   Wt 164 lb 6.4 oz (74.6 kg)   SpO2 95%   BMI 30.56 kg/m  Gen: NAD, resting comfortably CV: RRR no murmurs rubs or gallops Lungs: CTAB no crackles, wheeze, rhonchi Abdomen: soft/nontender/nondistended/normal bowel sounds. No rebound or guarding.  Ext: no edema Skin: warm, dry Neuro: grossly normal, moves all extremities MSK: very tender to palpation over left greater trochanter. Good hip ROM with no pain with flexion, IR, ER.   Assessment/Plan:  1. Planning colonoscopy early 2019- will be 5 years out 2. Meniere- right sided hearing loss recently without vertigo. Hearing loss stable 3. Given pneumovax 4. Failed zantac 5. HLD LDL 118 down to 106- has to continue weight  loss efforts 6. Stress eating and luckily has not gained any weight from physical - needs to work on this- she is going to start at the gym 7. CKD III - avoid nsaids  Left trochanteric bursitis S:  Significant lateral hip pain for last 6 months. Over left hip. Sometimes pain so bad feels like leg will give out- has not had falls. With palpation severe pain. Has not tried any medicine for this other than tylenol- slight relief A/P: trial low dose prednisone for left trochanteric bursitis. Use ice or heat 15 minutes 3x a day may also help. If not improved, can get you in with Dr. Paulla Fore to consider injection/further workup  CKD (chronic kidney disease), stage III S: kidney function has been stable. Does a great job avoiding NSAIDs. Blood pressure looks  great today. Working to get lipids controlled. Unable to get off PPI due to breakthrough A/P: update kidney function test today  Major depression in full remission ([place]) S: pristiq continues to help her. PHQ9 of 3. Using melatonin for sleep. Still needing trazodone- but the combo really helps. No SI A/P: doing well phq2 of 1 and that is even with husband's recent surgery. Thrilled she is doing so well.    Future Appointments  Date Time Provider [place]  07/19/2017  3:00 PM Williemae Area, RN [facility]   Return in about 27 weeks (around 07/14/2017).  Orders Placed This Encounter  Procedures  . Pneumococcal polysaccharide vaccine 23-valent greater than or equal to 2yo subcutaneous/IM  . CBC with Differential/Platelet  . Basic metabolic panel    [place]   Meds ordered this encounter  Medications  . predniSONE (DELTASONE) 20 MG tablet    Sig: Take 1 tablet by mouth daily for 5 days, then 1/2 tablet daily for 2 days    Dispense:  6 tablet    Refill:  0   Return precautions advised.  Garret Reddish, MD

## 2017-01-06 NOTE — Addendum Note (Signed)
Addended by: Marin Olp on: 01/06/2017 11:20 AM   Modules accepted: Orders

## 2017-01-13 ENCOUNTER — Other Ambulatory Visit: Payer: Self-pay | Admitting: Family Medicine

## 2017-01-18 ENCOUNTER — Ambulatory Visit (INDEPENDENT_AMBULATORY_CARE_PROVIDER_SITE_OTHER)
Admission: RE | Admit: 2017-01-18 | Discharge: 2017-01-18 | Disposition: A | Discharge status: Home / Self Care | Payer: Medicare Other | Source: Ambulatory Visit | Attending: Family Medicine | Admitting: Family Medicine

## 2017-01-18 DIAGNOSIS — E2839 Other primary ovarian failure: Secondary | ICD-10-CM | POA: Diagnosis not present

## 2017-02-03 ENCOUNTER — Other Ambulatory Visit: Payer: Self-pay | Admitting: Physician Assistant

## 2017-03-14 LAB — HM COLONOSCOPY

## 2017-04-25 ENCOUNTER — Encounter: Payer: Self-pay | Admitting: Family Medicine

## 2017-04-25 LAB — HM DIABETES EYE EXAM

## 2017-05-22 ENCOUNTER — Other Ambulatory Visit: Payer: Self-pay | Admitting: Family Medicine

## 2017-05-23 ENCOUNTER — Encounter: Payer: Self-pay | Admitting: Family Medicine

## 2017-07-18 ENCOUNTER — Encounter: Payer: Self-pay | Admitting: Family Medicine

## 2017-07-18 ENCOUNTER — Ambulatory Visit (INDEPENDENT_AMBULATORY_CARE_PROVIDER_SITE_OTHER): Payer: Medicare Other | Admitting: Family Medicine

## 2017-07-18 VITALS — BP 126/76 | HR 63 | Temp 98.3°F | Ht 61.5 in | Wt 150.2 lb

## 2017-07-18 DIAGNOSIS — H8101 Meniere's disease, right ear: Secondary | ICD-10-CM

## 2017-07-18 DIAGNOSIS — G4700 Insomnia, unspecified: Secondary | ICD-10-CM | POA: Diagnosis not present

## 2017-07-18 DIAGNOSIS — J309 Allergic rhinitis, unspecified: Secondary | ICD-10-CM | POA: Insufficient documentation

## 2017-07-18 DIAGNOSIS — N183 Chronic kidney disease, stage 3 unspecified: Secondary | ICD-10-CM

## 2017-07-18 DIAGNOSIS — J301 Allergic rhinitis due to pollen: Secondary | ICD-10-CM | POA: Diagnosis not present

## 2017-07-18 DIAGNOSIS — F325 Major depressive disorder, single episode, in full remission: Secondary | ICD-10-CM

## 2017-07-18 DIAGNOSIS — E785 Hyperlipidemia, unspecified: Secondary | ICD-10-CM | POA: Diagnosis not present

## 2017-07-18 DIAGNOSIS — D72819 Decreased white blood cell count, unspecified: Secondary | ICD-10-CM

## 2017-07-18 DIAGNOSIS — M85851 Other specified disorders of bone density and structure, right thigh: Secondary | ICD-10-CM | POA: Diagnosis not present

## 2017-07-18 DIAGNOSIS — Z0000 Encounter for general adult medical examination without abnormal findings: Secondary | ICD-10-CM

## 2017-07-18 LAB — CBC WITH DIFFERENTIAL/PLATELET
BASOS ABS: 0.1 10*3/uL (ref 0.0–0.1)
Basophils Relative: 2.6 % (ref 0.0–3.0)
EOS ABS: 0.1 10*3/uL (ref 0.0–0.7)
Eosinophils Relative: 3.5 % (ref 0.0–5.0)
HEMATOCRIT: 42.8 % (ref 36.0–46.0)
Hemoglobin: 14.3 g/dL (ref 12.0–15.0)
LYMPHS ABS: 1.6 10*3/uL (ref 0.7–4.0)
Lymphocytes Relative: 46.5 % — ABNORMAL HIGH (ref 12.0–46.0)
MCHC: 33.4 g/dL (ref 30.0–36.0)
MCV: 90.5 fl (ref 78.0–100.0)
Monocytes Absolute: 0.3 10*3/uL (ref 0.1–1.0)
Monocytes Relative: 9.4 % (ref 3.0–12.0)
NEUTROS ABS: 1.3 10*3/uL — AB (ref 1.4–7.7)
NEUTROS PCT: 38 % — AB (ref 43.0–77.0)
PLATELETS: 249 10*3/uL (ref 150.0–400.0)
RBC: 4.73 Mil/uL (ref 3.87–5.11)
RDW: 13.7 % (ref 11.5–15.5)
WBC: 3.4 10*3/uL — ABNORMAL LOW (ref 4.0–10.5)

## 2017-07-18 LAB — LIPID PANEL
CHOL/HDL RATIO: 3
Cholesterol: 178 mg/dL (ref 0–200)
HDL: 68.6 mg/dL (ref >39.00)
LDL Cholesterol: 92 mg/dL (ref 0–99)
NONHDL: 109.03
Triglycerides: 83 mg/dL (ref 0.0–149.0)
VLDL: 16.6 mg/dL (ref 0.0–40.0)

## 2017-07-18 LAB — COMPREHENSIVE METABOLIC PANEL
ALT: 17 U/L (ref 0–35)
AST: 23 U/L (ref 0–37)
Albumin: 4.6 g/dL (ref 3.5–5.2)
Alkaline Phosphatase: 58 U/L (ref 39–117)
BILIRUBIN TOTAL: 0.4 mg/dL (ref 0.2–1.2)
BUN: 18 mg/dL (ref 6–23)
CO2: 31 meq/L (ref 19–32)
Calcium: 10.5 mg/dL (ref 8.4–10.5)
Chloride: 104 mEq/L (ref 96–112)
Creatinine, Ser: 1.01 mg/dL (ref 0.40–1.20)
GFR: 57.93 mL/min — AB (ref >60.00)
GLUCOSE: 96 mg/dL (ref 70–99)
Potassium: 4.3 mEq/L (ref 3.5–5.1)
SODIUM: 141 meq/L (ref 135–145)
TOTAL PROTEIN: 6.8 g/dL (ref 6.0–8.3)

## 2017-07-18 NOTE — Assessment & Plan Note (Signed)
Meniere disease- chronic right sided hearing loss- had small episode of vertigo Sunday afternoon but none other than that

## 2017-07-18 NOTE — Assessment & Plan Note (Signed)
osteopenia/GERD- we discussed agranulocytosis risk with PPI and with leukopenia would prefer for her to be off. Also has osteopenia so better to be off PPI. Trial zantac 150mg  BID but failed this along with omeprazole 20mg . Rare breakthrough on omeprazole 40mg  - continue current dose PPI ultimately.   WBC stable. Prior hematology eval and released in 2018

## 2017-07-18 NOTE — Assessment & Plan Note (Signed)
Depression- controlled on pristiq with PHQ2 of 0 in December and patient states symptoms stable today

## 2017-07-18 NOTE — Assessment & Plan Note (Signed)
Insomnia- using trazodone with rasonable success

## 2017-07-18 NOTE — Assessment & Plan Note (Signed)
CKD III- avoid snsaids. GFR has trended closer to 60 on last few checks. Tylenol only for pain

## 2017-07-18 NOTE — Progress Notes (Signed)
Your CBC was stable (blood counts, infection fighting cells, platelets) with slightly low white blood cells- this is similar to findings over last few years  Your CMET was stable (kidney, liver, and electrolytes, blood sugar) with mild decreases in kidney function but stable over last year Your cholesterol has improved from last year and now in normal range- continue current meds

## 2017-07-18 NOTE — Assessment & Plan Note (Signed)
Allergies- on xyzal and singulair. Apparently allergies may contribute to her leukopenia as well

## 2017-07-18 NOTE — Assessment & Plan Note (Signed)
HLD- updated lipids look great today- mild poor control on fenofibrate and fish oil in past. Remains on same meds- weight loss has helped

## 2017-07-18 NOTE — Assessment & Plan Note (Signed)
Osteopenia- on calcium and vitamin D. Repeat 2021 or 2022 planned. RFN -1.2 worst t score -1.2 in 2019

## 2017-07-18 NOTE — Patient Instructions (Addendum)
Thanks for already doing labs today  Please check with your pharmacy to see if they have the shingrix vaccine. If they do- please get this immunization and update [place] by phone call or mychart with dates you receive the vaccine  Our team will try to get your mammogram from 2019- we have the one from 2018  No changes today

## 2017-07-18 NOTE — Progress Notes (Signed)
Phone: [phone]  Subjective:  Patient presents today for their annual physical. Chief complaint-noted.   See problem oriented charting- ROS- full  review of systems was completed and negative except for: insomnia, allergies  The following were reviewed and entered/updated in epic: Past Medical History:  Diagnosis Date  . Arthritis   . GERD (gastroesophageal reflux disease)   . Hyperlipidemia   . 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 Meniere disease   . Menopause    Patient Active Problem List   Diagnosis Date Noted  . Leukopenia 05/29/2014    Priority: Medium  . CKD (chronic kidney disease), stage III ([place]) 12/20/2013    Priority: Medium  . Meniere disease 12/20/2013    Priority: Medium  . Major depression in full remission ([place]) 06/07/2007    Priority: Medium  . Hyperlipidemia 10/25/2006    Priority: Medium  . Osteopenia 12/03/2014    Priority: Low  . Insomnia 05/29/2014    Priority: Low  . Alopecia 04/27/2009    Priority: Low  . GERD (gastroesophageal reflux disease) 01/09/2008    Priority: Low  . Irritable bowel syndrome 01/09/2008    Priority: Low  . Allergic rhinitis 07/18/2017   History reviewed. No pertinent surgical history.  Family History  Problem Relation Age of Onset  . Cancer Father        melanoma  . Heart disease Father        Died at 24  . COPD Mother   . Osteoporosis Mother   . Heart disease Unknown        died 57, around age 61-CABG, smoker    Medications- reviewed and updated Current Outpatient Medications  Medication Sig Dispense Refill  . acetaminophen (TYLENOL) 500 MG tablet Take 1,000 mg by mouth daily as needed for mild pain or headache.    Marland Kitchen aspirin EC 81 MG tablet Take 81 mg by mouth daily.    . Biotin 5000 MCG CAPS Take 5,000 mcg by mouth daily.    . Calcium Carbonate-Vit D-Min (CALCIUM 1200 PO) Take 1 tablet by mouth daily.     Marland Kitchen desvenlafaxine (PRISTIQ) 50 MG 24 hr tablet TAKE 1 TABLET BY MOUTH  DAILY 90 tablet  1  . fenofibrate 160 MG tablet TAKE 1 TABLET BY MOUTH  DAILY 90 tablet 1  . KRILL OIL PO Take 1 tablet by mouth daily.    Marland Kitchen levocetirizine (XYZAL) 5 MG tablet TAKE 1 TABLET BY MOUTH  EVERY EVENING 90 tablet 3  . Melatonin 5 MG TABS Take 1 tablet by mouth at bedtime.    . montelukast (SINGULAIR) 10 MG tablet TAKE 1 TABLET BY MOUTH AT  BEDTIME 90 tablet 1  . Multiple Vitamin (MULTIVITAMIN) tablet Take 1 tablet by mouth daily.    Marland Kitchen omeprazole (PRILOSEC) 40 MG capsule TAKE 1 CAPSULE BY MOUTH  DAILY 90 capsule 3  . polyethylene glycol powder (MIRALAX) powder Take 17 g by mouth daily as needed for mild constipation.     . Probiotic Product (PROBIOTIC ADVANCED PO) Take by mouth daily.    . traZODone (DESYREL) 100 MG tablet TAKE 1 TABLET BY MOUTH AT  BEDTIME 90 tablet 1   No current facility-administered medications for this visit.     Allergies-reviewed and updated Allergies  Allergen Reactions  . Erythromycin Swelling  . Sulfonamide Derivatives Nausea Only    Social History   Social History Narrative   Married. 2 children (son is special needs-in group home living). 1 grandchild      Retired  from [employer] different grades      Hobbies: reading, ipad, former bowling a lot    Objective: BP 126/76 (BP Location: Left Arm, Patient Position: Sitting, Cuff Size: Large)   Pulse 63   Temp 98.3 F (36.8 C) (Oral)   Ht 5' 1.5" (1.562 m)   Wt 150 lb 3.2 oz (68.1 kg)   SpO2 98%   BMI 27.92 kg/m  Gen: NAD, resting comfortably HEENT: Mucous membranes are moist. Oropharynx normal Neck: no thyromegaly CV: RRR no murmurs rubs or gallops Lungs: CTAB no crackles, wheeze, rhonchi Abdomen: soft/nontender/nondistended/normal bowel sounds. No rebound or guarding. Overweight but has lost substantial weight from last visit Ext: no edema Skin: warm, dry Neuro: grossly normal, moves all extremities, PERRLA  Assessment/Plan:  67 y.o. female presenting for annual physical.    Health Maintenance counseling: 1. Anticipatory guidance: Patient counseled regarding regular dental exams -q6 months, eye exams - yearly, wearing seatbelts.  2. Risk factor reduction:  Advised patient of need for regular exercise and diet rich and fruits and vegetables to reduce risk of heart attack and stroke. Exercise- had been going to gym first of year and slowed down around April- remains active in  The day- we discussed 150 minutes per week goal regular exercise. Diet-doing weight watchers style diet- just since June or so. .  Wt Readings from Last 3 Encounters:  07/18/17 150 lb 3.2 oz (68.1 kg)  01/06/17 164 lb 6.4 oz (74.6 kg)  11/19/16 166 lb (75.3 kg)  3. Immunizations/screenings/ancillary studies- - We discussed shingrix availability issues  as well as coverage issues (part D medicare)- I recommended that patient get vaccine at the pharmacy  Immunization History  Administered Date(s) Administered  . Influenza Split 11/26/2010, 12/12/2011  . Influenza Whole 10/18/2006, 11/10/2008, 11/24/2009  . Influenza, High Dose Seasonal PF 11/27/2015  . Influenza,inj,Quad PF,6+ Mos 12/12/2012, 10/29/2013, 12/03/2014  . Influenza-Unspecified 10/22/2016  . Pneumococcal Conjugate-13 12/03/2014  . Pneumococcal Polysaccharide-23 12/12/2011, 01/06/2017  . Td 01/18/2000  . Tdap 11/26/2010  . Zoster 04/09/2012  4. Cervical cancer screening-  patient still sees gynecology but has aged out of guidelines 5. Breast cancer screening-  breast exam through GYN and  mammogram within a year - we will need to get the most recent mammogram as we only have 2018 data 6. Colon cancer screening - 03/12/12 and had repeat this year. Planned 5 year repeat so 2024. This last colonoscopy with no abnormalities 7. Skin cancer screening- Dr. Willette Pa yearly. advised regular sunscreen use. Denies worrisome, changing, or new skin lesions.  8. Birth control/STD check- postmenopausal/monogamous  9. Osteoporosis screening at  57- see discussions below 10. Never smoker  Status of chronic or acute concerns   Left trochanteric bursitis - has done better since last visit- prednisone helped and then got new mattress and helping  Insomnia Insomnia- using trazodone with rasonable success  Allergic rhinitis Allergies- on xyzal and singulair. Apparently allergies may contribute to her leukopenia as well  Meniere disease Meniere disease- chronic right sided hearing loss- had small episode of vertigo Sunday afternoon but none other than that  Major depression in full remission ([place]) Depression- controlled on pristiq with PHQ2 of 0 in December and patient states symptoms stable today  Osteopenia Osteopenia- on calcium and vitamin D. Repeat 2021 or 2022 planned. RFN -1.2 worst t score -1.2 in 2019  Hyperlipidemia HLD- updated lipids look great today- mild poor control on fenofibrate and fish oil in past. Remains on same meds- weight loss  has helped  CKD (chronic kidney disease), stage III CKD III- avoid snsaids. GFR has trended closer to 60 on last few checks. Tylenol only for pain  Leukopenia osteopenia/GERD- we discussed agranulocytosis risk with PPI and with leukopenia would prefer for her to be off. Also has osteopenia so better to be off PPI. Trial zantac 150mg  BID but failed this along with omeprazole 20mg . Rare breakthrough on omeprazole 40mg  - continue current dose PPI ultimately.   WBC stable. Prior hematology eval and released in 2018   Future Appointments  Date Time Provider [place]  08/01/2017  1:00 PM Williemae Area, RN [facility]  01/19/2018  9:30 AM Marin Olp, MD [facility]   Return in about 6 months (around 01/18/2018) for follow up- or sooner if needed.  Lab/Order associations: Preventative health care - Plan: CBC with Differential/Platelet  Hyperlipidemia, unspecified hyperlipidemia type - Plan: Comprehensive metabolic panel, Lipid panel, CANCELED: CBC  Insomnia,  unspecified type  Seasonal allergic rhinitis due to pollen  Meniere's disease of right ear  Major depressive disorder with single episode, in full remission ([place])  Osteopenia of neck of right femur  CKD (chronic kidney disease), stage III ([facility])  Leukopenia, unspecified type  Return precautions advised.  Garret Reddish, MD

## 2017-07-19 ENCOUNTER — Ambulatory Visit: Payer: Medicare Other | Admitting: *Deleted

## 2017-08-01 ENCOUNTER — Ambulatory Visit (INDEPENDENT_AMBULATORY_CARE_PROVIDER_SITE_OTHER): Payer: Medicare Other

## 2017-08-01 VITALS — BP 114/70 | HR 74 | Ht 61.5 in | Wt 150.6 lb

## 2017-08-01 DIAGNOSIS — Z0000 Encounter for general adult medical examination without abnormal findings: Secondary | ICD-10-CM | POA: Diagnosis not present

## 2017-08-01 NOTE — Progress Notes (Addendum)
I have reviewed and agree with note, evaluation, plan. Thanks for [facility].   Garret Reddish, MD

## 2017-08-01 NOTE — Progress Notes (Signed)
Subjective:   Kristin Bonilla is a 67 y.o. female who presents for Medicare Annual (Subsequent) preventive examination.  Review of Systems:  No ROS.  Medicare Wellness Visit. Additional risk factors are reflected in the social history. Cardiac Risk Factors include: advanced age (>46men, >18 women) Patient lives in a single story home with husband and a special needs son who comes home every other weekend. She does have a shitzu Mazy.    Objective:     Vitals: BP 114/70 (BP Location: Left Arm, Patient Position: Sitting, Cuff Size: Large)   Pulse 74   Ht 5' 1.5" (1.562 m)   Wt 150 lb 9.6 oz (68.3 kg)   SpO2 97%   BMI 27.99 kg/m   Body mass index is 27.99 kg/m.  Advanced Directives 08/01/2017 07/18/2016 01/19/2016 11/01/2014  Does Patient Have a Medical Advance Directive? Yes Yes No No  Type of Paramedic of [place];Living will - -  Does patient want to make changes to medical advance directive? No - Patient declined - - -  Copy of [place] in Chart? No - copy requested No - copy requested - -  Would patient like information on creating a medical advance directive? - - - No - patient declined information    Tobacco Social History   Tobacco Use  Smoking Status Never Smoker  Smokeless Tobacco Never Used     Counseling given: Not Answered      Past Medical History:  Diagnosis Date  . Arthritis   . GERD (gastroesophageal reflux disease)   . Hyperlipidemia   . 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 Meniere disease   . Menopause    No past surgical history on file. Family History  Problem Relation Age of Onset  . Cancer Father        melanoma  . Heart disease Father        Died at 24  . Diverticulosis Father   . COPD Mother   . Osteoporosis Mother   . Hiatal hernia Mother   . Stroke Paternal Grandmother   . Heart disease Unknown        died 76, around age 37-CABG, smoker  . GER  disease Son   . Mental retardation Son   . Heart disease Son    Social History   Socioeconomic History  . Marital status: Married    Spouse name: Not on file  . Number of children: Not on file  . Years of education: Not on file  . Highest education level: Not on file  Occupational History  . Not on file  Social Needs  . Financial resource strain: Not on file  . Food insecurity:    Worry: Not on file    Inability: Not on file  . Transportation needs:    Medical: Not on file    Non-medical: Not on file  Tobacco Use  . Smoking status: Never Smoker  . Smokeless tobacco: Never Used  Substance and Sexual Activity  . Alcohol use: No    Alcohol/week: 0.0 oz  . Drug use: No  . Sexual activity: Yes  Lifestyle  . Physical activity:    Days per week: Not on file    Minutes per session: Not on file  . Stress: Not on file  Relationships  . Social connections:    Talks on phone: Not on file    Gets together: Not on file    Attends religious service:  Not on file    Active member of club or organization: Not on file    Attends meetings of clubs or organizations: Not on file    Relationship status: Not on file  Other Topics Concern  . Not on file  Social History Narrative   Married. 2 children (son is special needs-in group home living). 1 grandchild      Retired from [employer] different grades      Hobbies: reading, ipad, former bowling a lot    Outpatient Encounter Medications as of 08/01/2017  Medication Sig  . acetaminophen (TYLENOL) 500 MG tablet Take 1,000 mg by mouth daily as needed for mild pain or headache.  Marland Kitchen aspirin EC 81 MG tablet Take 81 mg by mouth daily.  . Biotin 5000 MCG CAPS Take 5,000 mcg by mouth daily.  . Calcium Carbonate-Vit D-Min (CALCIUM 1200 PO) Take 1 tablet by mouth daily.   Marland Kitchen desvenlafaxine (PRISTIQ) 50 MG 24 hr tablet TAKE 1 TABLET BY MOUTH  DAILY  . fenofibrate 160 MG tablet TAKE 1 TABLET BY MOUTH  DAILY  . levocetirizine  (XYZAL) 5 MG tablet TAKE 1 TABLET BY MOUTH  EVERY EVENING  . Melatonin 5 MG TABS Take 1 tablet by mouth at bedtime.  . montelukast (SINGULAIR) 10 MG tablet TAKE 1 TABLET BY MOUTH AT  BEDTIME  . Multiple Vitamin (MULTIVITAMIN) tablet Take 1 tablet by mouth daily.  . Omega-3 Fatty Acids (FISH OIL BURP-LESS PO) Take 1 capsule by mouth daily.  Marland Kitchen omeprazole (PRILOSEC) 40 MG capsule TAKE 1 CAPSULE BY MOUTH  DAILY  . polyethylene glycol powder (MIRALAX) powder Take 17 g by mouth daily as needed for mild constipation.   . Probiotic Product (PROBIOTIC ADVANCED PO) Take by mouth daily.  . traZODone (DESYREL) 100 MG tablet TAKE 1 TABLET BY MOUTH AT  BEDTIME  . [DISCONTINUED] KRILL OIL PO Take 1 tablet by mouth daily.  . [DISCONTINUED] cetirizine (ZYRTEC) 10 MG tablet Take 10 mg by mouth daily.     No facility-administered encounter medications on file as of 08/01/2017.     Activities of Daily Living In your present state of health, do you have any difficulty performing the following activities: 08/01/2017  Hearing? Y  Comment Has been followed by [place]? N  Difficulty concentrating or making decisions? N  Walking or climbing stairs? N  Dressing or bathing? N  Doing errands, shopping? N  Preparing Food and eating ? N  Using the Toilet? N  In the past six months, have you accidently leaked urine? N  Do you have problems with loss of bowel control? Y  Comment Constipation, uses Miralax  Managing your Medications? N  Managing your Finances? N  Housekeeping or managing your Housekeeping? N  Some recent data might be hidden    Patient Care Team: Marin Olp, MD as PCP - General (Family Medicine) Dian Queen, MD as Consulting Physician (Obstetrics and Gynecology) Erlene Quan [place] Cal[place], MD as Referring Physician (Internal Medicine) Starlyn Skeans as Physician Assistant (Dermatology)    Assessment:   This is a routine wellness examination for [facility].  Exercise  Activities and Dietary recommendations Current Exercise Habits: The patient does not participate in regular exercise at present, Exercise limited by: None identified   Breakfast: Fruit, eggs, bacon, or an egg sandwich Lunch: Green beans, cucumbers, corn on the cob, maybe a piece of chicken Dinner: Watermelon, cantaloupe, low calorie popcorn...snack style dinner  Not really a sweet tooth, prefers  salty  Drink diet sodas (2/day, Coke Zero), not enough water, coffee in the winter  Goals    . Increase physical activity     Wants to increase social circle and find a church home     . Weight (lb) < 150 lb (68 kg)     Lose weight by following diet provided by nutritionist.        Fall Risk Fall Risk  08/01/2017 07/18/2016 07/12/2016 06/04/2015 12/03/2014  Falls in the past year? No No No No No    Depression Screen PHQ 2/9 Scores 08/01/2017 01/06/2017 07/18/2016 07/12/2016  PHQ - 2 Score 0 1 1 0  PHQ- 9 Score 3 3 - -    PHQ-9 completed. History of depression. Patient taking Pristiq and feels it is working well for her. She is working with her husband to find a permanent church home. Hopes to increase their social circle of friends to be able to do more group activities.  Cognitive Function     6CIT Screen 08/01/2017  What Year? 0 points  What month? 0 points  What time? 0 points  Count back from 20 0 points  Months in reverse 0 points    Immunization History  Administered Date(s) Administered  . Influenza Split 11/26/2010, 12/12/2011  . Influenza Whole 10/18/2006, 11/10/2008, 11/24/2009  . Influenza, High Dose Seasonal PF 11/27/2015  . Influenza,inj,Quad PF,6+ Mos 12/12/2012, 10/29/2013, 12/03/2014  . Influenza-Unspecified 10/22/2016  . Pneumococcal Conjugate-13 12/03/2014  . Pneumococcal Polysaccharide-23 12/12/2011, 01/06/2017  . Td 01/18/2000  . Tdap 11/26/2010  . Zoster 04/09/2012      Screening Tests Health Maintenance  Topic Date Due  . INFLUENZA VACCINE  08/17/2017    . MAMMOGRAM  02/28/2018  . TETANUS/TDAP  11/25/2020  . COLONOSCOPY  03/14/2022  . DEXA SCAN  Completed  . Hepatitis C Screening  Completed  . PNA vac Low Risk Adult  Completed        Plan:    Follow Up with PCP   I have personally reviewed and noted the following in the patient's chart:   . Medical and social history . Use of alcohol, tobacco or illicit drugs  . Current medications and supplements . Functional ability and status . Nutritional status . Physical activity . Advanced directives . List of other physicians . Vitals . Screenings to include cognitive, depression, and falls . Referrals and appointments  In addition, I have reviewed and discussed with patient certain preventive protocols, quality metrics, and best practice recommendations. A written personalized care plan for preventive services as well as general preventive health recommendations were provided to patient.     [place], [place]  [id number]

## 2017-08-01 NOTE — Progress Notes (Addendum)
PCP notes:none   Health maintenance:up to date   Abnormal screenings:None    Patient concerns: None   Nurse concerns:None   Next PCP appt: Follow Up as Needed. Scheduled for 01/19/2018

## 2017-08-01 NOTE — Patient Instructions (Addendum)
  Ms. Goold , Thank you for taking time to come for your Medicare Wellness Visit. I appreciate your ongoing commitment to your health goals. Please review the following plan we discussed and let me know if I can assist you in the future.   These are the goals we discussed: Goals    . Increase physical activity     Wants to increase social circle and find a church home     . Weight (lb) < 150 lb (68 kg)     Lose weight by following diet provided by nutritionist.        This is a list of the screening recommended for you and due dates:  Health Maintenance  Topic Date Due  . Flu Shot  08/17/2017  . Mammogram  02/28/2018  . Tetanus Vaccine  11/25/2020  . Colon Cancer Screening  03/14/2022  . DEXA scan (bone density measurement)  Completed  .  Hepatitis C: One time screening is recommended by Center for Disease Control  (CDC) for  adults born from [birth year] through 1966.   Completed  . Pneumonia vaccines  Completed

## 2017-10-12 ENCOUNTER — Encounter: Payer: Self-pay | Admitting: Family Medicine

## 2017-11-12 ENCOUNTER — Other Ambulatory Visit: Payer: Self-pay | Admitting: Family Medicine

## 2018-01-16 ENCOUNTER — Other Ambulatory Visit: Payer: Self-pay | Admitting: Family Medicine

## 2018-01-19 ENCOUNTER — Ambulatory Visit: Payer: Medicare Other | Admitting: Family Medicine

## 2018-01-19 ENCOUNTER — Encounter: Payer: Self-pay | Admitting: Family Medicine

## 2018-01-19 VITALS — BP 118/72 | HR 70 | Temp 98.1°F | Ht 62.0 in | Wt 147.6 lb

## 2018-01-19 DIAGNOSIS — D72819 Decreased white blood cell count, unspecified: Secondary | ICD-10-CM

## 2018-01-19 DIAGNOSIS — J301 Allergic rhinitis due to pollen: Secondary | ICD-10-CM | POA: Diagnosis not present

## 2018-01-19 DIAGNOSIS — N183 Chronic kidney disease, stage 3 unspecified: Secondary | ICD-10-CM

## 2018-01-19 DIAGNOSIS — E785 Hyperlipidemia, unspecified: Secondary | ICD-10-CM

## 2018-01-19 DIAGNOSIS — F325 Major depressive disorder, single episode, in full remission: Secondary | ICD-10-CM

## 2018-01-19 LAB — CBC WITH DIFFERENTIAL/PLATELET
BASOS ABS: 0.1 10*3/uL (ref 0.0–0.1)
BASOS PCT: 2.8 % (ref 0.0–3.0)
EOS ABS: 0.2 10*3/uL (ref 0.0–0.7)
Eosinophils Relative: 5.8 % — ABNORMAL HIGH (ref 0.0–5.0)
HCT: 42 % (ref 36.0–46.0)
Hemoglobin: 13.9 g/dL (ref 12.0–15.0)
Lymphocytes Relative: 44.3 % (ref 12.0–46.0)
Lymphs Abs: 1.5 10*3/uL (ref 0.7–4.0)
MCHC: 33.1 g/dL (ref 30.0–36.0)
MCV: 90.2 fl (ref 78.0–100.0)
MONOS PCT: 8.5 % (ref 3.0–12.0)
Monocytes Absolute: 0.3 10*3/uL (ref 0.1–1.0)
NEUTROS ABS: 1.3 10*3/uL — AB (ref 1.4–7.7)
NEUTROS PCT: 38.6 % — AB (ref 43.0–77.0)
PLATELETS: 285 10*3/uL (ref 150.0–400.0)
RBC: 4.66 Mil/uL (ref 3.87–5.11)
RDW: 13.2 % (ref 11.5–15.5)
WBC: 3.4 10*3/uL — ABNORMAL LOW (ref 4.0–10.5)

## 2018-01-19 LAB — BASIC METABOLIC PANEL
BUN: 27 mg/dL — ABNORMAL HIGH (ref 6–23)
CALCIUM: 10.4 mg/dL (ref 8.4–10.5)
CO2: 30 meq/L (ref 19–32)
Chloride: 103 mEq/L (ref 96–112)
Creatinine, Ser: 0.92 mg/dL (ref 0.40–1.20)
GFR: 64.42 mL/min (ref >60.00)
GLUCOSE: 92 mg/dL (ref 70–99)
Potassium: 4.1 mEq/L (ref 3.5–5.1)
Sodium: 142 mEq/L (ref 135–145)

## 2018-01-19 NOTE — Assessment & Plan Note (Signed)
S: Reasonably controlled on fenofibrate and fish oil alone-not on statin but LDL under 100 and triglycerides under 200 Lab Results  Component Value Date   CHOL 178 07/18/2017   HDL 68.60 07/18/2017   LDLCALC 92 07/18/2017   LDLDIRECT 110.4 12/05/2011   TRIG 83.0 07/18/2017   CHOLHDL 3 07/18/2017   A/P: Stable-continue current medications.  Update full lipid panel likely next visit

## 2018-01-19 NOTE — Assessment & Plan Note (Signed)
S:She has been told her allergies may contribute to her leukopenia.  PPI may also cause agranulocytosis but she has failed multiple trials of trying to reduce PPI.  Patient has been seen by hematology in the past and released in 2018 A/P: hopefully stable- update CBC with labs-likely continue to trend every 6 months

## 2018-01-19 NOTE — Progress Notes (Signed)
Subjective:  Kristin Bonilla is a 68 y.o. year old very pleasant female patient who presents for/with See problem oriented charting ROS- some left ear fullness , neck tenderness. Some posterior neck pain as well. No fever or chills. No shortness of breath  Past Medical History-  Patient Active Problem List   Diagnosis Date Noted  . Leukopenia 05/29/2014    Priority: Medium  . CKD (chronic kidney disease), stage III ([place]) 12/20/2013    Priority: Medium  . Meniere disease 12/20/2013    Priority: Medium  . Major depression in full remission ([place]) 06/07/2007    Priority: Medium  . Hyperlipidemia 10/25/2006    Priority: Medium  . Allergic rhinitis 07/18/2017    Priority: Low  . Osteopenia 12/03/2014    Priority: Low  . Insomnia 05/29/2014    Priority: Low  . Alopecia 04/27/2009    Priority: Low  . GERD (gastroesophageal reflux disease) 01/09/2008    Priority: Low  . Irritable bowel syndrome 01/09/2008    Priority: Low    Medications- reviewed and updated Current Outpatient Medications  Medication Sig Dispense Refill  . acetaminophen (TYLENOL) 500 MG tablet Take 1,000 mg by mouth daily as needed for mild pain or headache.    Marland Kitchen aspirin EC 81 MG tablet Take 81 mg by mouth daily.    . Biotin 5000 MCG CAPS Take 5,000 mcg by mouth daily.    . Calcium Carbonate-Vit D-Min (CALCIUM 1200 PO) Take 1 tablet by mouth daily.     Marland Kitchen desvenlafaxine (PRISTIQ) 50 MG 24 hr tablet TAKE 1 TABLET BY MOUTH  DAILY 90 tablet 1  . fenofibrate 160 MG tablet TAKE 1 TABLET BY MOUTH  DAILY 90 tablet 1  . levocetirizine (XYZAL) 5 MG tablet TAKE 1 TABLET BY MOUTH  EVERY EVENING 90 tablet 3  . Melatonin 5 MG TABS Take 1 tablet by mouth at bedtime.    . montelukast (SINGULAIR) 10 MG tablet TAKE 1 TABLET BY MOUTH AT  BEDTIME 90 tablet 1  . Multiple Vitamin (MULTIVITAMIN) tablet Take 1 tablet by mouth daily.    . Omega-3 Fatty Acids (FISH OIL BURP-LESS PO) Take 1 capsule by mouth daily.    Marland Kitchen omeprazole (PRILOSEC)  40 MG capsule TAKE 1 CAPSULE BY MOUTH  DAILY 90 capsule 3  . polyethylene glycol powder (MIRALAX) powder Take 17 g by mouth daily as needed for mild constipation.     . Probiotic Product (PROBIOTIC ADVANCED PO) Take by mouth daily.    . traZODone (DESYREL) 100 MG tablet TAKE 1 TABLET BY MOUTH AT  BEDTIME 90 tablet 1   No current facility-administered medications for this visit.     Objective: BP 118/72 (BP Location: Left Arm, Patient Position: Sitting, Cuff Size: Normal)   Pulse 70   Temp 98.1 F (36.7 C) (Oral)   Ht 5\' 2"  (1.575 m)   Wt 147 lb 9.6 oz (67 kg)   SpO2 98%   BMI 27.00 kg/m  Gen: NAD, resting comfortably TM normal bilaterally, slight pain with palpation at angle of jaw, nasal turbinates normal, pharynx with mild erythema/signs of drainage CV: RRR no murmurs rubs or gallops Lungs: CTAB no crackles, wheeze, rhonchi Abdomen: soft/nontender/nondistended/normal bowel sounds. No rebound or guarding.  Ext: no edema Skin: warm, dry  Assessment/Plan:  Other notes: 1.Patient has been having some neck issues with some snapping and popping- seems worse lately. Has tried chiropractic care in past- and helpful. She plans to return. Hip/trochanteric bursitis better lately.  Will monitor  2. Also for 2 weeks with some throat discomfort as well as left ear pain. Has had some cough from drainage. Mainly clear discharge from nose. Throat sensitive with swallowing. We discussed could be sinus related or OME but not obvious on exam- no clear obvious cause other than potential allergies/sinus irritation but no obvious sinus infection today- discussed trial OTC flonase like product. Asked her to keep me updated- may be on the verge of emerging illness. Also could be radiating from neck 3. Down 3 lbs even through the Holidays- she ate well In holidays but had been really good prior to holidays.    Leukopenia S:She has been told her allergies may contribute to her leukopenia.  PPI may also cause  agranulocytosis but she has failed multiple trials of trying to reduce PPI.  Patient has been seen by hematology in the past and released in 2018 A/P: hopefully stable- update CBC with labs-likely continue to trend every 6 months  CKD (chronic kidney disease), stage III S: Patient with known chronic kidney disease stage III-GFR has been in50s. Avoids nsaids.  A/P: hopefully stable- update bmp today  Allergic rhinitis S: Patient on Xyzal and Singulair with reasonable control of allergies. A/P: stable- continue current medications  Major depression in full remission ([place]) S: Depression remains in full remission with PHQ 9 of 0 today with pristiq 50 mg. Trazodone helps with sleep A/P: Stable-continue current medications.  Hyperlipidemia S: Reasonably controlled on fenofibrate and fish oil alone-not on statin but LDL under 100 and triglycerides under 200 Lab Results  Component Value Date   CHOL 178 07/18/2017   HDL 68.60 07/18/2017   LDLCALC 92 07/18/2017   LDLDIRECT 110.4 12/05/2011   TRIG 83.0 07/18/2017   CHOLHDL 3 07/18/2017   A/P: Stable-continue current medications.  Update full lipid panel likely next visit  Future Appointments  Date Time Provider Uncertain  08/07/2018 10:00 AM [facility] HEALTH COACH [facility]  08/07/2018 11:00 AM Marin Olp, MD [facility]    Lab/Order associations: CKD (chronic kidney disease), stage III ([place]) - Plan: Basic metabolic panel, CBC with Differential/Platelet  Leukopenia, unspecified type  Seasonal allergic rhinitis due to pollen  Major depressive disorder with single episode, in full remission ([place])  Hyperlipidemia, unspecified hyperlipidemia type  Return precautions advised.  Garret Reddish, MD

## 2018-01-19 NOTE — Assessment & Plan Note (Signed)
S: Depression remains in full remission with PHQ 9 of 0 today with pristiq 50 mg. Trazodone helps with sleep A/P: Stable-continue current medications.

## 2018-01-19 NOTE — Assessment & Plan Note (Signed)
S: Patient with known chronic kidney disease stage III-GFR has been in50s. Avoids nsaids.  A/P: hopefully stable- update bmp today

## 2018-01-19 NOTE — Assessment & Plan Note (Signed)
S: Patient on Xyzal and Singulair with reasonable control of allergies. A/P: stable- continue current medications

## 2018-01-19 NOTE — Patient Instructions (Addendum)
Please stop by lab before you go  Team- please contact [facility] to get dates of 2 shingrix shots. She thinks august and October.   No changes today

## 2018-01-21 ENCOUNTER — Other Ambulatory Visit: Payer: Self-pay | Admitting: Family Medicine

## 2018-03-12 LAB — HM MAMMOGRAPHY

## 2018-03-27 LAB — HM PAP SMEAR: HM Pap smear: NEGATIVE

## 2018-05-31 ENCOUNTER — Other Ambulatory Visit: Payer: Self-pay | Admitting: Family Medicine

## 2018-06-14 DIAGNOSIS — H2510 Age-related nuclear cataract, unspecified eye: Secondary | ICD-10-CM

## 2018-06-14 DIAGNOSIS — H269 Unspecified cataract: Secondary | ICD-10-CM

## 2018-06-14 HISTORY — DX: Unspecified cataract: H26.9

## 2018-06-14 HISTORY — DX: Age-related nuclear cataract, unspecified eye: H25.10

## 2018-07-16 ENCOUNTER — Encounter: Payer: Self-pay | Admitting: Physical Therapy

## 2018-07-26 ENCOUNTER — Ambulatory Visit: Payer: Self-pay | Admitting: *Deleted

## 2018-07-26 NOTE — Telephone Encounter (Signed)
Message from [place] sent at 07/26/2018 5:19 PM EDT  Please FU with pt she was have a pain in her lower side around the stomach and she took a Gas-x and it did not help however she ate something and then it seemed to ease off. She has now been to the bathroom and she is seeing blood and wants to know if she should be concerned. Pls FU at [phone]   Pt states she was taking Mucilex this morning and she experienced some pain on her side left side. Pt states about 3:30 -4 pm this afternoon while urinating she noticed a pink color on the toilet paper with wiping. Pt also states that when she looked in the toilet  the water was pinkish-red in color No urgency, burning or fever. Pt states that she experienced pain to the left flank area that radiates towards her stomach and back. Pt advised if she becomes worse or if fever occurs during the night to seek treatment in the Urgent Care/ED. Pt verbalized understanding. Pt can be contacted at [phone] to schedule appt.   Reason for Disposition . Side (flank) or back pain present  Answer Assessment - Initial Assessment Questions 1. COLOR of URINE: "Describe the color of the urine."  (e.g., tea-colored, pink, red, blood clots, bloody)     Pinkish red color 2. ONSET: "When did the bleeding start?"      Today around 3:30-4 pm 3. EPISODES: "How many times has there been blood in the urine?" or "How many times today?"     3 4. PAIN with URINATION: "Is there any pain with passing your urine?" If so, ask: "How bad is the pain?"  (Scale 1-10; or mild, moderate, severe)    - MILD - complains slightly about urination hurting    - MODERATE - interferes with normal activities      - SEVERE - excruciating, unwilling or unable to urinate because of the pain      No 5. FEVER: "Do you have a fever?" If so, ask: "What is your temperature, how was it measured, and when did it start?"     no 6. ASSOCIATED SYMPTOMS: "Are you passing urine more frequently  than usual?"     No 7. OTHER SYMPTOMS: "Do you have any other symptoms?" (e.g., back/flank pain, abdominal pain, vomiting)     Left flank pain that radiates to the bac 8. PREGNANCY: "Is there any chance you are pregnant?" "When was your last menstrual period?"     no  Protocols used: URINE - BLOOD IN-A-AH

## 2018-07-27 ENCOUNTER — Encounter: Payer: Self-pay | Admitting: Family Medicine

## 2018-07-27 ENCOUNTER — Ambulatory Visit: Payer: Medicare Other | Admitting: Family Medicine

## 2018-07-27 ENCOUNTER — Other Ambulatory Visit: Payer: Self-pay

## 2018-07-27 ENCOUNTER — Ambulatory Visit (INDEPENDENT_AMBULATORY_CARE_PROVIDER_SITE_OTHER): Payer: Medicare Other

## 2018-07-27 VITALS — BP 100/60 | HR 60 | Temp 98.6°F | Ht 62.0 in | Wt 147.0 lb

## 2018-07-27 DIAGNOSIS — R319 Hematuria, unspecified: Secondary | ICD-10-CM

## 2018-07-27 LAB — POCT URINALYSIS DIPSTICK
Bilirubin, UA: NEGATIVE
Blood, UA: POSITIVE
Glucose, UA: NEGATIVE
Ketones, UA: NEGATIVE
Nitrite, UA: NEGATIVE
Protein, UA: POSITIVE — AB
Spec Grav, UA: 1.01 (ref 1.010–1.025)
Urobilinogen, UA: 0.2 E.U./dL
pH, UA: 8 (ref 5.0–8.0)

## 2018-07-27 MED ORDER — HYDROCODONE-ACETAMINOPHEN 5-325 MG PO TABS: 1.0000 | ORAL_TABLET | Freq: Four times a day (QID) | ORAL | 0 refills | Status: DC | PRN

## 2018-07-27 MED ORDER — TAMSULOSIN HCL 0.4 MG PO CAPS: 0.4000 mg | ORAL_CAPSULE | Freq: Every day | ORAL | 0 refills | Status: DC

## 2018-07-27 NOTE — Telephone Encounter (Signed)
Patient is scheduled for 07/27/18 at 4pm. Patient said she not having pain anymore or blood in her urine.

## 2018-07-27 NOTE — Telephone Encounter (Signed)
Noted  

## 2018-07-27 NOTE — Progress Notes (Signed)
Phone [phone]   Subjective:  Kristin Bonilla is a 68 y.o. year old very pleasant female patient who presents for/with See problem oriented charting Chief Complaint  Patient presents with  . Hematuria   ROS- no dysuria, polyuria. Has noted polyuria and left lower side pain. No nausea or vomiting reported.    Past Medical History-  Patient Active Problem List   Diagnosis Date Noted  . Leukopenia 05/29/2014    Priority: Medium  . CKD (chronic kidney disease), stage III (HCC) 12/20/2013    Priority: Medium  . Meniere disease 12/20/2013    Priority: Medium  . Major depression in full remission (HCC) 06/07/2007    Priority: Medium  . Hyperlipidemia 10/25/2006    Priority: Medium  . Allergic rhinitis 07/18/2017    Priority: Low  . Osteopenia 12/03/2014    Priority: Low  . Insomnia 05/29/2014    Priority: Low  . Alopecia 04/27/2009    Priority: Low  . GERD (gastroesophageal reflux disease) 01/09/2008    Priority: Low  . Irritable bowel syndrome 01/09/2008    Priority: Low  . Cataracts, bilateral 06/14/2018  . Nuclear sclerosis 06/14/2018    Medications- reviewed and updated Current Outpatient Medications  Medication Sig Dispense Refill  . acetaminophen (TYLENOL) 500 MG tablet Take 1,000 mg by mouth daily as needed for mild pain or headache.    . aspirin EC 81 MG tablet Take 81 mg by mouth daily.    . Biotin 5000 MCG CAPS Take 5,000 mcg by mouth daily.    . Calcium Carbonate-Vit D-Min (CALCIUM 1200 PO) Take 1 tablet by mouth daily.     . desvenlafaxine (PRISTIQ) 50 MG 24 hr tablet TAKE 1 TABLET BY MOUTH  DAILY 90 tablet 1  . fenofibrate 160 MG tablet TAKE 1 TABLET BY MOUTH  DAILY 90 tablet 1  . Influenza Vac A&B Surf Ant Adj (FLUAD) 0.5 ML SUSY Fluad 2019-20 65yr up(PF)45 mcg(15 mcgx3)/0.5 mL intramuscular syringe  ADM 0.5ML IM UTD    . levocetirizine (XYZAL) 5 MG tablet TAKE 1 TABLET BY MOUTH  EVERY EVENING 90 tablet 3  . Melatonin 5 MG TABS Take 1 tablet by mouth at  bedtime.    . montelukast (SINGULAIR) 10 MG tablet TAKE 1 TABLET BY MOUTH AT  BEDTIME 90 tablet 1  . Multiple Vitamin (MULTIVITAMIN) tablet Take 1 tablet by mouth daily.    . Omega-3 Fatty Acids (FISH OIL BURP-LESS PO) Take 1 capsule by mouth daily.    . Omeprazole (PRILOSEC PO) omeprazole    . omeprazole (PRILOSEC) 40 MG capsule TAKE 1 CAPSULE BY MOUTH  DAILY 90 capsule 3  . polyethylene glycol powder (MIRALAX) powder Take 17 g by mouth daily as needed for mild constipation.     . Probiotic Product (PROBIOTIC ADVANCED PO) Take by mouth daily.    . traZODone (DESYREL) 100 MG tablet TAKE 1 TABLET BY MOUTH AT  BEDTIME 90 tablet 1  . Zoster Vaccine Adjuvanted (SHINGRIX) injection Shingrix (PF) 50 mcg/0.5 mL intramuscular suspension, kit  ADM 0.5ML IM UTD    . HYDROcodone-acetaminophen (NORCO/VICODIN) 5-325 MG tablet Take 1 tablet by mouth every 6 (six) hours as needed for moderate pain or severe pain. 8 tablet 0  . tamsulosin (FLOMAX) 0.4 MG CAPS capsule Take 1 capsule (0.4 mg total) by mouth daily. 10 capsule 0   No current facility-administered medications for this visit.      Objective:  BP 100/60   Pulse 60   Temp 98.6 F (37   C) (Oral)   Ht 5' 2" (1.575 m)   Wt 147 lb (66.7 kg)   SpO2 96%   BMI 26.89 kg/m  Gen: NAD, resting comfortably CV: RRR no murmurs rubs or gallops Lungs: CTAB no crackles, wheeze, rhonchi Abdomen: soft/nontender/nondistended/normal bowel sounds. No rebound or guarding. No cva tenderness- points to spot of pain over left lower outer abdomen/left side- with deep palpation does not reproduce pain Ext: no edema Skin: warm, dry  Results for orders placed or performed in visit on 07/27/18 (from the past 24 hour(s))  POCT Urinalysis Dipstick     Status: Abnormal   Collection Time: 07/27/18  4:12 PM  Result Value Ref Range   Color, UA Red    Clarity, UA Cloudy    Glucose, UA Negative Negative   Bilirubin, UA Negative    Ketones, UA Negative    Spec Grav, UA  1.010 1.010 - 1.025   Blood, UA Positive    pH, UA 8.0 5.0 - 8.0   Protein, UA Positive (A) Negative   Urobilinogen, UA 0.2 0.2 or 1.0 E.U./dL   Nitrite, UA Negative    Leukocytes, UA Trace (A) Negative   Appearance     Odor         Assessment and Plan   #Gross hematuria S: Patient noted pink discoloration on toilet paper when wiping after urinating yesterday around 3 30 to 4 pm. Water was pinkish- red in color. Denies urgency burning or fever. Patient with some pain in her left flank area that radiates towards her stomach and back. Pain 3/10 on left side and tends to radiate towards the back and then towards abdomen as well. Never had a kidney stone before . In reflection noted some pain in the AM yesterday and then stopped by 2 pm and then recurred later in afternoon around 4. Did ok last night and started again this morning. Took miralax yesterday and 2 gas x but didn't help. Miralax made her concerned about cramping/gas thus the trial of Gas-X. Later ate and pain seemed to ease off as far as gaseous distention/discomfort.  Never smoker. States she drinks plenty of fluids. Advised at least 60 oz per day over weekend.   A/P: 68 year old female with gross hematuria and left-sided pain now radiating towards the abdomen- I am concerned about a kidney stone-imaging as below also suggested possible distal ureteral stone.  I did give patient some hydrocodone after reviewing NCCSRS in case she has worsening pain over the weekend.  I later ordered Flomax after her visit to try to help her expel the stone.  He gave ER precautions.  Blood pressure was noted to be somewhat low today but she feels fine overall other than this pain-do not believe she is septic but advised to proceed to ER if new or worsening symptoms.  We did discuss possible diverticulitis but that would not explain hematuria so we thought this was much lower on differential.  Desired to do blood work today but it was too late for the  last spent for phlebotomy in order for labs to be picked up.  If patient does past kidney stone (encouraged her to strain her urine) but I do not think we will have to do further work-up for hematuria such as urology referral unless recurrent- we discussed doing possible CT scan next week-would be unable to set up over the weekend if symptoms persist.  I do not think the nonobstructive right kidney stone is because of hematuria-more likely distal  left ureteral stone    Dg Abd 1 View  Result Date: 07/27/2018 CLINICAL DATA:  Hematuria for 2 days EXAM: ABDOMEN - 1 VIEW COMPARISON:  None. FINDINGS: Scattered large and small bowel gas is noted. Mild retained fecal material is noted. Tiny calcification is noted over the lower pole of the right kidney. Calcifications are noted in the left hemipelvis. Most of these are likely phleboliths. The possibility of a distal left ureteral stone would deserve consideration. No bony abnormality is noted. IMPRESSION: Nonobstructing right renal stone. Calcifications within the pelvis of uncertain etiology. CT urography may be helpful for further evaluation. Electronically Signed   By: Mark  Lukens M.D.   On: 07/27/2018 17:38  Addendum- strongly suspect left distal ureteral stone based on patient's symptoms-we will add Flomax  Future Appointments  Date Time Provider Department Center  08/07/2018 11:00 AM Hunter, Stephen O, MD [facility]   Lab/Order associations:   ICD-10-CM   1. Hematuria, unspecified type  R31.9 POCT Urinalysis Dipstick    Urine Microscopic Only    Urine Microscopic Only    POCT Urinalysis Dipstick    Urine Culture    Urine Culture    DG Abd 1 View    CANCELED: Urine Microscopic Only    Meds ordered this encounter  Medications  . HYDROcodone-acetaminophen (NORCO/VICODIN) 5-325 MG tablet    Sig: Take 1 tablet by mouth every 6 (six) hours as needed for moderate pain or severe pain.    Dispense:  8 tablet    Refill:  0  . tamsulosin  (FLOMAX) 0.4 MG CAPS capsule    Sig: Take 1 capsule (0.4 mg total) by mouth daily.    Dispense:  10 capsule    Refill:  0     Return precautions advised.  Stephen Hunter, MD  

## 2018-07-27 NOTE — Telephone Encounter (Signed)
[place], contact patient to schedule virtual appointment to discuss.   Clinical team be advised.

## 2018-07-27 NOTE — Patient Instructions (Addendum)
Health Maintenance Due  Topic Date Due  . MAMMOGRAM - Sign release of information at the check out desk for last mammogram ONLY from Dr. Runell Gess  02/28/2018    Please stop by x-ray before you go If you do not have mychart- we will call you about results within 5 business days of [place] receiving them.  If you have mychart- we will send your results within 3 business days of [place] receiving them.  If abnormal or we want to clarify a result, we will call or mychart you to make sure you receive the message.  If you have questions or concerns or don't hear within 5-7 days, please send [place] a message or call us.   We are going to make sure no urinary tract infection and also get x-ray to see if there is an obvious stone. Depending on results- may go ahead and get full bloodwork next week ahead of your physical. Also may get noncontrast CT (did not order today due to not being able to get scheduled until next week)

## 2018-07-28 LAB — URINALYSIS, MICROSCOPIC ONLY
Bacteria, UA: NONE SEEN /HPF
Hyaline Cast: NONE SEEN /LPF
Squamous Epithelial / HPF: NONE SEEN /HPF (ref <=5)

## 2018-07-28 LAB — URINE CULTURE
MICRO NUMBER:: 654894
SPECIMEN QUALITY:: ADEQUATE

## 2018-07-30 ENCOUNTER — Encounter: Payer: Self-pay | Admitting: Family Medicine

## 2018-07-30 DIAGNOSIS — R319 Hematuria, unspecified: Secondary | ICD-10-CM

## 2018-07-31 NOTE — Telephone Encounter (Signed)
Called and spoke with patient, she is agreeable to referral and would like to see Dr. Louis Meckel at [facility]. Referral has been placed. Med list updated.

## 2018-08-07 ENCOUNTER — Ambulatory Visit (INDEPENDENT_AMBULATORY_CARE_PROVIDER_SITE_OTHER): Payer: Medicare Other | Admitting: Family Medicine

## 2018-08-07 ENCOUNTER — Other Ambulatory Visit: Payer: Self-pay

## 2018-08-07 ENCOUNTER — Ambulatory Visit: Payer: Medicare Other

## 2018-08-07 ENCOUNTER — Encounter: Payer: Self-pay | Admitting: Family Medicine

## 2018-08-07 VITALS — BP 128/70 | HR 70 | Temp 98.2°F | Ht 61.5 in | Wt 153.6 lb

## 2018-08-07 DIAGNOSIS — N183 Chronic kidney disease, stage 3 unspecified: Secondary | ICD-10-CM

## 2018-08-07 DIAGNOSIS — D72819 Decreased white blood cell count, unspecified: Secondary | ICD-10-CM | POA: Diagnosis not present

## 2018-08-07 DIAGNOSIS — E785 Hyperlipidemia, unspecified: Secondary | ICD-10-CM

## 2018-08-07 DIAGNOSIS — Z0000 Encounter for general adult medical examination without abnormal findings: Secondary | ICD-10-CM

## 2018-08-07 LAB — LIPID PANEL
Cholesterol: 187 mg/dL (ref 0–200)
HDL: 77.5 mg/dL (ref >39.00)
LDL Cholesterol: 95 mg/dL (ref 0–99)
NonHDL: 109.65
Total CHOL/HDL Ratio: 2
Triglycerides: 74 mg/dL (ref 0.0–149.0)
VLDL: 14.8 mg/dL (ref 0.0–40.0)

## 2018-08-07 LAB — COMPREHENSIVE METABOLIC PANEL
ALT: 13 U/L (ref 0–35)
AST: 20 U/L (ref 0–37)
Albumin: 4.3 g/dL (ref 3.5–5.2)
Alkaline Phosphatase: 47 U/L (ref 39–117)
BUN: 20 mg/dL (ref 6–23)
CO2: 29 mEq/L (ref 19–32)
Calcium: 9.8 mg/dL (ref 8.4–10.5)
Chloride: 105 mEq/L (ref 96–112)
Creatinine, Ser: 1.05 mg/dL (ref 0.40–1.20)
GFR: 51.95 mL/min — ABNORMAL LOW (ref >60.00)
Glucose, Bld: 91 mg/dL (ref 70–99)
Potassium: 4 mEq/L (ref 3.5–5.1)
Sodium: 142 mEq/L (ref 135–145)
Total Bilirubin: 0.5 mg/dL (ref 0.2–1.2)
Total Protein: 6.2 g/dL (ref 6.0–8.3)

## 2018-08-07 LAB — POC URINALSYSI DIPSTICK (AUTOMATED)
Bilirubin, UA: NEGATIVE
Blood, UA: NEGATIVE
Glucose, UA: NEGATIVE
Ketones, UA: NEGATIVE
Leukocytes, UA: NEGATIVE
Nitrite, UA: NEGATIVE
Protein, UA: NEGATIVE
Spec Grav, UA: 1.015 (ref 1.010–1.025)
Urobilinogen, UA: 0.2 E.U./dL
pH, UA: 7 (ref 5.0–8.0)

## 2018-08-07 MED ORDER — DESVENLAFAXINE SUCCINATE ER 50 MG PO TB24: 50.0000 mg | ORAL_TABLET | Freq: Every day | ORAL | 3 refills | Status: DC

## 2018-08-07 MED ORDER — FENOFIBRATE 160 MG PO TABS: 160.0000 mg | ORAL_TABLET | Freq: Every day | ORAL | 3 refills | Status: DC

## 2018-08-07 MED ORDER — LEVOCETIRIZINE DIHYDROCHLORIDE 5 MG PO TABS: 5.0000 mg | ORAL_TABLET | Freq: Every evening | ORAL | 3 refills | Status: DC

## 2018-08-07 MED ORDER — OMEPRAZOLE 20 MG PO CPDR: 20.0000 mg | DELAYED_RELEASE_CAPSULE | Freq: Every day | ORAL | 3 refills | Status: DC

## 2018-08-07 MED ORDER — MONTELUKAST SODIUM 10 MG PO TABS: 10.0000 mg | ORAL_TABLET | Freq: Every day | ORAL | 3 refills | Status: DC

## 2018-08-07 MED ORDER — TRAZODONE HCL 100 MG PO TABS: 100.0000 mg | ORAL_TABLET | Freq: Every day | ORAL | 3 refills | Status: DC

## 2018-08-07 NOTE — Progress Notes (Signed)
Phone: [phone]   Subjective:  Patient presents today for their annual physical. Chief complaint-noted.   See problem oriented charting- ROS- full  review of systems was completed and negative except for: hearing loss from meniere's and hearing aids didn't  Help, joint pain, neck pain, neck stiffness  The following were reviewed and entered/updated in epic: Past Medical History:  Diagnosis Date  . Arthritis   . Cataracts, bilateral 06/14/2018  . GERD (gastroesophageal reflux disease)   . Hyperlipidemia   . 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 Meniere disease   . Menopause   . Nuclear sclerosis 06/14/2018   Patient Active Problem List   Diagnosis Date Noted  . Leukopenia 05/29/2014    Priority: Medium  . CKD (chronic kidney disease), stage III ([place]) 12/20/2013    Priority: Medium  . Meniere disease 12/20/2013    Priority: Medium  . Major depression in full remission ([place]) 06/07/2007    Priority: Medium  . Hyperlipidemia 10/25/2006    Priority: Medium  . Allergic rhinitis 07/18/2017    Priority: Low  . Osteopenia 12/03/2014    Priority: Low  . Insomnia 05/29/2014    Priority: Low  . Alopecia 04/27/2009    Priority: Low  . GERD (gastroesophageal reflux disease) 01/09/2008    Priority: Low  . Irritable bowel syndrome 01/09/2008    Priority: Low  . Cataracts, bilateral 06/14/2018  . Nuclear sclerosis 06/14/2018   History reviewed. No pertinent surgical history.  Family History  Problem Relation Age of Onset  . Cancer Father        melanoma  . Heart disease Father        Died at 59  . Diverticulosis Father   . COPD Mother   . Osteoporosis Mother   . Hiatal hernia Mother   . Stroke Paternal Grandmother   . Heart disease Other        died 92, around age 24-CABG, smoker  . GER disease Son   . Mental retardation Son   . Heart disease Son     Medications- reviewed and updated Current Outpatient Medications  Medication Sig Dispense Refill  .  acetaminophen (TYLENOL) 500 MG tablet Take 1,000 mg by mouth daily as needed for mild pain or headache.    Marland Kitchen aspirin EC 81 MG tablet Take 81 mg by mouth daily.    . Biotin 5000 MCG CAPS Take 5,000 mcg by mouth daily.    . Calcium Carbonate-Vit D-Min (CALCIUM 1200 PO) Take 1 tablet by mouth daily.     Marland Kitchen desvenlafaxine (PRISTIQ) 50 MG 24 hr tablet Take 1 tablet (50 mg total) by mouth daily. 90 tablet 3  . fenofibrate 160 MG tablet Take 1 tablet (160 mg total) by mouth daily. 90 tablet 3  . Influenza Vac A&B Surf Ant Adj (FLUAD) 0.5 ML SUSY Fluad 2019-20 67yrup(PF)45 mcg(15 mcgx3)/0.5 mL intramuscular syringe  ADM 0.5ML IM UTD    . levocetirizine (XYZAL) 5 MG tablet Take 1 tablet (5 mg total) by mouth every evening. 90 tablet 3  . Melatonin 5 MG TABS Take 1 tablet by mouth at bedtime.    . montelukast (SINGULAIR) 10 MG tablet Take 1 tablet (10 mg total) by mouth at bedtime. 90 tablet 3  . Multiple Vitamin (MULTIVITAMIN) tablet Take 1 tablet by mouth daily.    . Omega-3 Fatty Acids (FISH OIL BURP-LESS PO) Take 1 capsule by mouth daily.    . Omeprazole (PRILOSEC PO) omeprazole    . omeprazole (PRILOSEC) 20 MG  capsule Take 1 capsule (20 mg total) by mouth daily. Trial 31m- if not effective can take 2 and we can resend 436mdose 90 capsule 3  . polyethylene glycol powder (MIRALAX) powder Take 17 g by mouth daily as needed for mild constipation.     . Probiotic Product (PROBIOTIC ADVANCED PO) Take by mouth daily.    . traZODone (DESYREL) 100 MG tablet Take 1 tablet (100 mg total) by mouth at bedtime. 90 tablet 3  . Zoster Vaccine Adjuvanted ([facility] Shingrix (PF) 50 mcg/0.5 mL intramuscular suspension, kit  ADM 0.5ML IM UTD     No current facility-administered medications for this visit.     Allergies-reviewed and updated Allergies  Allergen Reactions  . Erythromycin Swelling  . Sulfa Antibiotics   . Sulfonamide Derivatives Nausea Only    Social History   Social History  Narrative   Married. 2 children (son is special needs-in group home living). 1 grandchild      Retired from [employer]      Hobbies: reading, ipad, former bowling a lot   Objective  Objective:  BP 128/70   Pulse 70   Temp 98.2 F (36.8 C) (Oral)   Ht 5' 1.5" (1.562 m)   Wt 153 lb 9.6 oz (69.7 kg)   SpO2 98%   BMI 28.55 kg/m  Gen: NAD, resting comfortably HEENT: Mucous membranes are moist. Oropharynx normal Neck: no thyromegaly CV: RRR no murmurs rubs or gallops Lungs: CTAB no crackles, wheeze, rhonchi Abdomen: soft/nontender/nondistended/normal bowel sounds. No rebound or guarding.  Ext: no edema Skin: warm, dry Neuro: grossly normal, moves all extremities, PERRLA   Assessment and Plan    68. female presenting for annual physical.  Health Maintenance counseling: 1. Anticipatory guidance: Patient counseled regarding regular dental exams -q6 months, eye exams - yearly,  avoiding smoking and second hand smoke , limiting alcohol to 1 beverage per day- she doesn't drink .   2. Risk factor reduction:  Advised patient of need for regular exercise and diet rich and fruits and vegetables to reduce risk of heart attack and stroke. Exercise- states doesn't want to exercise- feels too hot to go out. Encouraged her to start regular exercise and discussed health benefits. Does enjoy swimming hope that would be more accessible within a year. Doesn't ride a bike though has one. Diet-weight up 6 lbs from last visit- has been doing some non healthy cooking with covid 19. She has tried to improve her diet recently Wt Readings from Last 3 Encounters:  08/07/18 153 lb 9.6 oz (69.7 kg)  07/27/18 147 lb (66.7 kg)  01/19/18 147 lb 9.6 oz (67 kg)  3. Immunizations/screenings/ancillary studies- fully up to date.  Immunization History  Administered Date(s) Administered  . Influenza Split 11/26/2010, 12/12/2011  . Influenza Whole 10/18/2006, 11/10/2008,  11/24/2009  . Influenza, High Dose Seasonal PF 11/27/2015  . Influenza,inj,Quad PF,6+ Mos 12/12/2012, 10/29/2013, 12/03/2014  . Influenza-Unspecified 10/22/2016, 09/18/2017  . Pneumococcal Conjugate-13 12/03/2014  . Pneumococcal Polysaccharide-23 12/12/2011, 01/06/2017  . Td 01/18/2000  . Tdap 11/26/2010  . Zoster 04/09/2012  . Zoster Recombinat (Shingrix) 09/10/2017, 11/10/2017  4. Cervical cancer screening- still sees gyn but technically passed age based screening 5. Breast cancer screening-  breast exam with GYN and mammogram - has had this- we will try to get records again 6. Colon cancer screening - 02/2017 with 5 year repeat planned in 20214 7. Skin cancer screening- Dr. ShWillette Paearly with derm. advised regular sunscreen use-  she does ok.  Denies worrisome, changing, or new skin lesions.  8. Birth control/STD check-  Postmenopausal/monogomous 9. Osteoporosis screening at 89- see below -Never smoker  Status of chronic or acute concerns  GERD - Taking Omeprazole 40 mg. Occasional breakthrough if doing yardwork and body turned downward. I dont think we can reduce dose- she is going to try 20 mg OTC and if that works can reduce- in light of CKD III  Allergic Rhinitis - Taking Levocetirizine 5 mg and Montelukast 10 mg. Working pretty well  IBS -Taking probiotic and Miralax prn.   Meniere Disease - ongoing tinnitus and hearing loss in right ear.   CKD III- avoid nsaids. Check cmp today. GFR has been closer to 60 recently and even over 60 last check   Hyperlipidemia - Taking Fenofibrate 160 mg, Fish Oil, and Aspirin 81 mg daily. Will update lipids today  # Depression- good control/remission on pristiq. Still gets disgusted with the world at times.  Depression screen [facility] 2/9 07/27/2018  Decreased Interest 0  Down, Depressed, Hopeless 1  PHQ - 2 Score 1  Altered sleeping 0  Tired, decreased energy 0  Change in appetite 1  Feeling bad or failure about yourself  0  Trouble  concentrating 0  Moving slowly or fidgety/restless 0  Suicidal thoughts 0  PHQ-9 Score 2  Difficult doing work/chores Not difficult at all  - discussed serotonin syndrome risks   Insomnia - Taking Trazodone 100 mg and Melatonin at bedtime.   August visit with Dr. Louis Meckel for gross hematuria. No more blood in urine since that day- did not catch stone  Osteopenia/leukopenia- low white blood cells. PPI does increase risk- she is going to try omeprazole 36m and try to do pepcid over zantac. Noted osteopenia- would be better on lower dose PPI. Can repeat 01/2019 or 01/2020.  - also get path smear review in addition to cbc/diff  Some neck stiffness- saw chiropractor and seems to be improving. Neck somewhat tight today- discussed heat/stretching regimen  Recommended follow up: 6 month follow up  Lab/Order associations: fasting    ICD-10-CM   1. Preventative health care  Z00.00 Comprehensive metabolic panel    Lipid panel    POCT Urinalysis Dipstick (Automated)    CBC with Differential/Platelet    Pathologist smear review    POCT Urinalysis Dipstick (Automated)    CANCELED: CBC  2. CKD (chronic kidney disease), stage III (HCC)  N18.3 Comprehensive metabolic panel    Lipid panel    POCT Urinalysis Dipstick (Automated)    POCT Urinalysis Dipstick (Automated)    CANCELED: CBC  3. Hyperlipidemia, unspecified hyperlipidemia type  E78.5 Comprehensive metabolic panel    Lipid panel    POCT Urinalysis Dipstick (Automated)    POCT Urinalysis Dipstick (Automated)    CANCELED: CBC  4. Leukopenia, unspecified type  D72.819 CBC with Differential/Platelet    Pathologist smear review    Meds ordered this encounter  Medications  . levocetirizine (XYZAL) 5 MG tablet    Sig: Take 1 tablet (5 mg total) by mouth every evening.    Dispense:  90 tablet    Refill:  3  . traZODone (DESYREL) 100 MG tablet    Sig: Take 1 tablet (100 mg total) by mouth at bedtime.    Dispense:  90 tablet    Refill:  3   . fenofibrate 160 MG tablet    Sig: Take 1 tablet (160 mg total) by mouth daily.    Dispense:  90  tablet    Refill:  3  . desvenlafaxine (PRISTIQ) 50 MG 24 hr tablet    Sig: Take 1 tablet (50 mg total) by mouth daily.    Dispense:  90 tablet    Refill:  3  . omeprazole (PRILOSEC) 20 MG capsule    Sig: Take 1 capsule (20 mg total) by mouth daily. Trial 45m- if not effective can take 2 and we can resend 445mdose    Dispense:  90 capsule    Refill:  3  . montelukast (SINGULAIR) 10 MG tablet    Sig: Take 1 tablet (10 mg total) by mouth at bedtime.    Dispense:  90 tablet    Refill:  3    Return precautions advised.  StGarret ReddishMD

## 2018-08-07 NOTE — Progress Notes (Signed)
Phone: [phone]    Subjective:   Patient presents today for their annual wellness visit.    Preventive Screening-Counseling & Management  Smoking Status: Never Smoker Second Hand Smoking status: No smokers in home Alcohol intake: 0 per week  Advanced directives- husband HCPOA. Full code but would not want prolonged ventilation if possible.   Risk Factors Regular exercise: no, but advised importance Diet: some weight gain with covid 19- Encouraged need for healthy eating, regular exercise, weight loss.   Fall Risk: None  Fall Risk  08/07/2018 08/01/2017 07/18/2016 07/12/2016 06/04/2015  Falls in the past year? 0 No No No No  Opioid use history:  no long term opioids use  Cardiac risk factors:  advanced age (older than 69 for men, 59 for women)  Treated Hyperlipidemia  no Hypertension  No diabetes. Will screen with fasting cbg today Family History: father with CAD in late 44s- was a smoker.    Depression Screen None. PHQ2 0  See depression discussion in physical  Depression screen [facility] 2/9 07/27/2018 01/19/2018 01/19/2018 08/01/2017 01/06/2017  Decreased Interest 0 0 0 0 0  Down, Depressed, Hopeless 1 0 0 0 1  PHQ - 2 Score 1 0 0 0 1  Altered sleeping 0 0 - 0 0  Tired, decreased energy 0 0 - 0 0  Change in appetite 1 0 - 2 1  Feeling bad or failure about yourself  0 0 - 1 1  Trouble concentrating 0 0 - 0 0  Moving slowly or fidgety/restless 0 0 - 0 0  Suicidal thoughts 0 0 - 0 0  PHQ-9 Score 2 0 - 3 3  Difficult doing work/chores Not difficult at all Not difficult at all - - Not difficult at all    Activities of Daily Living Independent ADLs and IADLs   Hearing Difficulties: -patient endorses due to meniere's- hearing aids were not helpful- no issues with left ear hearing  Cognitive Testing             No reported trouble.   Mini cog: normal clock draw. 2/3 delayed recall. Normal test result   [facility]  List the Names of Other Physician/Practitioners you  currently use: -Dr. Helane Rima ob/gyn -Dr. Teryl Lucy chiropractor -Dr. Kathlen Mody optho -Dr. Willette Pa derm  Immunization History  Administered Date(s) Administered  . Influenza Split 11/26/2010, 12/12/2011  . Influenza Whole 10/18/2006, 11/10/2008, 11/24/2009  . Influenza, High Dose Seasonal PF 11/27/2015  . Influenza,inj,Quad PF,6+ Mos 12/12/2012, 10/29/2013, 12/03/2014  . Influenza-Unspecified 10/22/2016, 09/18/2017  . Pneumococcal Conjugate-13 12/03/2014  . Pneumococcal Polysaccharide-23 12/12/2011, 01/06/2017  . Td 01/18/2000  . Tdap 11/26/2010  . Zoster 04/09/2012  . Zoster Recombinat (Shingrix) 09/10/2017, 11/10/2017   Required Immunizations needed today - none up to date Health Maintenance  Topic Date Due  . Mammogram  02/28/2018  . Flu Shot  08/18/2018  . Tetanus Vaccine  11/25/2020  . Colon Cancer Screening  03/14/2022  . DEXA scan (bone density measurement)  Completed  .  Hepatitis C: One time screening is recommended by Center for Disease Control  (CDC) for  adults born from [birth year] through 1966.   Completed  . Pneumonia vaccines  Completed    Screening tests-  1. Colon cancer screening- 02/2017 with 5 year repeat planned 2. Lung Cancer screening- not a candidate- never smoker 3. Skin cancer screening- sees derm yearly- Dr. Willette Pa 4. Cervical cancer screening- 68 based screening but still sees GYN yearly 5. Breast cancer screening- with GYN- we need  copy of her mammogram- asked team to get copy  ROS- No pertinent positives discovered in course of AWV See physical for problem oriented discussion  The following were reviewed and entered/updated in epic: Past Medical History:  Diagnosis Date  . Arthritis   . Cataracts, bilateral 06/14/2018  . GERD (gastroesophageal reflux disease)   . Hyperlipidemia   . 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 Meniere disease   . Menopause   . Nuclear sclerosis 06/14/2018   Patient Active Problem List   Diagnosis Date  Noted  . Leukopenia 05/29/2014    Priority: Medium  . CKD (chronic kidney disease), stage III ([place]) 12/20/2013    Priority: Medium  . Meniere disease 12/20/2013    Priority: Medium  . Major depression in full remission ([place]) 06/07/2007    Priority: Medium  . Hyperlipidemia 10/25/2006    Priority: Medium  . Allergic rhinitis 07/18/2017    Priority: Low  . Osteopenia 12/03/2014    Priority: Low  . Insomnia 05/29/2014    Priority: Low  . Alopecia 04/27/2009    Priority: Low  . GERD (gastroesophageal reflux disease) 01/09/2008    Priority: Low  . Irritable bowel syndrome 01/09/2008    Priority: Low  . Cataracts, bilateral 06/14/2018  . Nuclear sclerosis 06/14/2018   History reviewed. No pertinent surgical history.  Family History  Problem Relation Age of Onset  . Cancer Father        melanoma  . Heart disease Father        Died at 52  . Diverticulosis Father   . COPD Mother   . Osteoporosis Mother   . Hiatal hernia Mother   . Stroke Paternal Grandmother   . Heart disease Other        died 59, around age 61-CABG, smoker  . GER disease Son   . Mental retardation Son   . Heart disease Son     Medications- reviewed and updated Current Outpatient Medications  Medication Sig Dispense Refill  . acetaminophen (TYLENOL) 500 MG tablet Take 1,000 mg by mouth daily as needed for mild pain or headache.    Marland Kitchen aspirin EC 81 MG tablet Take 81 mg by mouth daily.    . Biotin 5000 MCG CAPS Take 5,000 mcg by mouth daily.    . Calcium Carbonate-Vit D-Min (CALCIUM 1200 PO) Take 1 tablet by mouth daily.     Marland Kitchen desvenlafaxine (PRISTIQ) 50 MG 24 hr tablet Take 1 tablet (50 mg total) by mouth daily. 90 tablet 3  . fenofibrate 160 MG tablet Take 1 tablet (160 mg total) by mouth daily. 90 tablet 3  . Influenza Vac A&B Surf Ant Adj (FLUAD) 0.5 ML SUSY Fluad 2019-20 64yrup(PF)45 mcg(15 mcgx3)/0.5 mL intramuscular syringe  ADM 0.5ML IM UTD    . levocetirizine (XYZAL) 5 MG tablet Take 1 tablet (5  mg total) by mouth every evening. 90 tablet 3  . Melatonin 5 MG TABS Take 1 tablet by mouth at bedtime.    . montelukast (SINGULAIR) 10 MG tablet Take 1 tablet (10 mg total) by mouth at bedtime. 90 tablet 3  . Multiple Vitamin (MULTIVITAMIN) tablet Take 1 tablet by mouth daily.    . Omega-3 Fatty Acids (FISH OIL BURP-LESS PO) Take 1 capsule by mouth daily.    . Omeprazole (PRILOSEC PO) omeprazole    . omeprazole (PRILOSEC) 20 MG capsule Take 1 capsule (20 mg total) by mouth daily. Trial 279m if not effective can take 2 and we can resend 4097m  dose 90 capsule 3  . polyethylene glycol powder (MIRALAX) powder Take 17 g by mouth daily as needed for mild constipation.     . Probiotic Product (PROBIOTIC ADVANCED PO) Take by mouth daily.    . traZODone (DESYREL) 100 MG tablet Take 1 tablet (100 mg total) by mouth at bedtime. 90 tablet 3  . Zoster Vaccine Adjuvanted [facility]) injection Shingrix (PF) 50 mcg/0.5 mL intramuscular suspension, kit  ADM 0.5ML IM UTD     No current facility-administered medications for this visit.     Allergies-reviewed and updated Allergies  Allergen Reactions  . Erythromycin Swelling  . Sulfa Antibiotics   . Sulfonamide Derivatives Nausea Only    Social History   Socioeconomic History  . Marital status: Married    Spouse name: Not on file  . Number of children: Not on file  . Years of education: Not on file  . Highest education level: Not on file  Occupational History  . Not on file  Social Needs  . Financial resource strain: Not on file  . Food insecurity    Worry: Not on file    Inability: Not on file  . Transportation needs    Medical: Not on file    Non-medical: Not on file  Tobacco Use  . Smoking status: Never Smoker  . Smokeless tobacco: Never Used  Substance and Sexual Activity  . Alcohol use: No    Alcohol/week: 0.0 standard drinks  . Drug use: No  . Sexual activity: Yes  Lifestyle  . Physical activity    Days per week: Not on file     Minutes per session: Not on file  . Stress: Not on file  Relationships  . Social Herbalist on phone: Not on file    Gets together: Not on file    Attends religious service: Not on file    Active member of club or organization: Not on file    Attends meetings of clubs or organizations: Not on file    Relationship status: Not on file  Other Topics Concern  . Not on file  Social History Narrative   Married. 2 children (son is special needs-in group home living). 1 grandchild      Retired from [employer] different grades      Hobbies: reading, ipad, former bowling a lot      Objective:  BP 128/70   Pulse 70   Temp 98.2 F (36.8 C) (Oral)   Ht 5' 1.5" (1.562 m)   Wt 153 lb 9.6 oz (69.7 kg)   SpO2 98%   BMI 28.55 kg/m  Gen: NAD, resting comfortably   Assessment/Plan:  AWV completed- discussed recommended screenings and documented any personalized health advice and referrals for preventive counseling. See AVS as well which was given to patient.   Status of chronic or acute concerns  See physical for problem oriented discussion  Recommended follow up: 1 year physical and medicare wellness  Lab/Order associations:    1. Preventative health care    Return precautions advised. Garret Reddish, MD

## 2018-08-07 NOTE — Patient Instructions (Addendum)
Health Maintenance Due  Topic Date Due  . MAMMOGRAM - patient signed paperwork for physicians for wom[facility] Dr. Runell Gess 2 weeks ago- can we send them a simple form and rerequest? 02/28/2018    Kristin Bonilla , Thank you for taking time to come for your Medicare Wellness Visit. I appreciate your ongoing commitment to your health goals. Please review the following plan we discussed and let me know if I can assist you in the future.   These are the goals we discussed: 1. Try to get weight back under 150  2. Follow up 6 months 3. No other changes today- other than try 20mg  omeprazole  This is a list of the screening recommended for you and due dates:  Health Maintenance  Topic Date Due  . Mammogram  02/28/2018  . Flu Shot  08/18/2018  . Tetanus Vaccine  11/25/2020  . Colon Cancer Screening  03/14/2022  . DEXA scan (bone density measurement)  Completed  .  Hepatitis C: One time screening is recommended by Center for Disease Control  (CDC) for  adults born from [birth year] through 1966.   Completed  . Pneumonia vaccines  Completed

## 2018-08-08 ENCOUNTER — Encounter: Payer: Self-pay | Admitting: Family Medicine

## 2018-08-08 LAB — PATHOLOGIST SMEAR REVIEW

## 2018-09-13 ENCOUNTER — Encounter: Payer: Self-pay | Admitting: Family Medicine

## 2018-09-13 ENCOUNTER — Ambulatory Visit: Payer: Medicare Other | Admitting: Family Medicine

## 2018-09-13 ENCOUNTER — Other Ambulatory Visit: Payer: Self-pay

## 2018-09-13 VITALS — BP 98/70 | HR 72 | Temp 98.2°F | Ht 61.5 in | Wt 153.2 lb

## 2018-09-13 DIAGNOSIS — Z23 Encounter for immunization: Secondary | ICD-10-CM

## 2018-09-13 DIAGNOSIS — N758 Other diseases of Bartholin's gland: Secondary | ICD-10-CM

## 2018-09-13 DIAGNOSIS — R350 Frequency of micturition: Secondary | ICD-10-CM

## 2018-09-13 LAB — POCT URINALYSIS DIPSTICK
Bilirubin, UA: NEGATIVE
Blood, UA: NEGATIVE
Glucose, UA: NEGATIVE
Ketones, UA: NEGATIVE
Leukocytes, UA: NEGATIVE
Nitrite, UA: NEGATIVE
Protein, UA: NEGATIVE
Spec Grav, UA: 1.01 (ref 1.010–1.025)
Urobilinogen, UA: 0.2 E.U./dL
pH, UA: 8 (ref 5.0–8.0)

## 2018-09-13 MED ORDER — DOXYCYCLINE HYCLATE 100 MG PO TABS: 100.0000 mg | ORAL_TABLET | Freq: Two times a day (BID) | ORAL | 0 refills | Status: AC

## 2018-09-13 NOTE — Patient Instructions (Signed)
Please follow up if symptoms do not improve or as needed.   You have an infected bartholin gland. The doxy should help this.  Your urine test was normal. Let me know if your urinary symptoms do not resolve with treatment of the infection of the gland.   Today you were given your flu vaccination.

## 2018-09-13 NOTE — Progress Notes (Signed)
Subjective  CC:  Chief Complaint  Patient presents with  . Urinary Tract Infection    x 1 week, Frequent urination,  aching feeling   . bump on rt side of buttock    sore and red   Same day acute visit; PCP not available. New pt to me. Chart reviewed.   HPI: Kristin Bonilla is a 68 y.o. female who presents to the office today to address the problems listed above in the chief complaint.  Very pleasant 68 yo with small red bump on perineum x several months but over last 4-5 days with redness, mild soreness and mildly enlarged. No pain, drainage, f/c/s. 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 Also c/o strange sensation in private area .... like fleeting sensation that comes and goes, not related to voiding. No dysuria or urinary frequency. No vaginal sxs. No pelvic pain or abdominal pain. No f/c/s. Doesn't feel like typical bladder infection.   Assessment  1. Bartholin's gland infection   2. Urinary frequency      Plan   Bartholin gland infection, mild:  Reassured. Cover with doxy. No abscess.   Perineal sensation: ?referred from perineum infection. Monitor for now.normal urine today. Call if changes or worsens.   Follow up: Return if symptoms worsen or fail to improve.  02/07/2019  Orders Placed This Encounter  Procedures  . POCT urinalysis dipstick   Meds ordered this encounter  Medications  . doxycycline (VIBRA-TABS) 100 MG tablet    Sig: Take 1 tablet (100 mg total) by mouth 2 (two) times daily for 7 days.    Dispense:  14 tablet    Refill:  0      I reviewed the patients updated PMH, FH, and SocHx.    Patient Active Problem List   Diagnosis Date Noted  . Cataracts, bilateral 06/14/2018  . Nuclear sclerosis 06/14/2018  . Allergic rhinitis 07/18/2017  . Osteopenia 12/03/2014  . Leukopenia 05/29/2014  . Insomnia 05/29/2014  . CKD (chronic kidney disease), stage III ([place]) 12/20/2013  . Meniere disease 12/20/2013  . Alopecia 04/27/2009   . GERD (gastroesophageal reflux disease) 01/09/2008  . Irritable bowel syndrome 01/09/2008  . Major depression in full remission ([place]) 06/07/2007  . Hyperlipidemia 10/25/2006   Current Meds  Medication Sig  . acetaminophen (TYLENOL) 500 MG tablet Take 1,000 mg by mouth daily as needed for mild pain or headache.  Marland Kitchen aspirin EC 81 MG tablet Take 81 mg by mouth daily.  . Biotin 5000 MCG CAPS Take 5,000 mcg by mouth daily.  . Calcium Carbonate-Vit D-Min (CALCIUM 1200 PO) Take 1 tablet by mouth daily.   Marland Kitchen desvenlafaxine (PRISTIQ) 50 MG 24 hr tablet Take 1 tablet (50 mg total) by mouth daily.  . fenofibrate 160 MG tablet Take 1 tablet (160 mg total) by mouth daily.  Marland Kitchen levocetirizine (XYZAL) 5 MG tablet Take 1 tablet (5 mg total) by mouth every evening.  . Melatonin 5 MG TABS Take 1 tablet by mouth at bedtime.  . montelukast (SINGULAIR) 10 MG tablet Take 1 tablet (10 mg total) by mouth at bedtime.  . Multiple Vitamin (MULTIVITAMIN) tablet Take 1 tablet by mouth daily.  . Omega-3 Fatty Acids (FISH OIL BURP-LESS PO) Take 1 capsule by mouth daily.  Marland Kitchen omeprazole (PRILOSEC) 20 MG capsule Take 1 capsule (20 mg total) by mouth daily. Trial 69m- if not effective can take 2 and we can resend 433mdose  . polyethylene glycol powder (MIRALAX) powder Take  17 g by mouth daily as needed for mild constipation.   . Probiotic Product (PROBIOTIC ADVANCED PO) Take by mouth daily.  . traZODone (DESYREL) 100 MG tablet Take 1 tablet (100 mg total) by mouth at bedtime.  Marland Kitchen Zoster Vaccine Adjuvanted [facility]) injection Shingrix (PF) 50 mcg/0.5 mL intramuscular suspension, kit  ADM 0.5ML IM UTD    Allergies: Patient is allergic to erythromycin; sulfa antibiotics; and sulfonamide derivatives. Family History: Patient family history includes COPD in her mother; Cancer in her father; Diverticulosis in her father; GER disease in her son; Heart disease in her father, son, and another family member; Hiatal hernia in her  mother; Mental retardation in her son; Osteoporosis in her mother; Stroke in her paternal grandmother. Social History:  Patient  reports that she has never smoked. She has never used smokeless tobacco. She reports that she does not drink alcohol or use drugs.  Review of Systems: Constitutional: Negative for fever malaise or anorexia Cardiovascular: negative for chest pain Respiratory: negative for SOB or persistent cough Gastrointestinal: negative for abdominal pain  Objective  Vitals: BP 98/70 (BP Location: Left Arm, Patient Position: Sitting, Cuff Size: Normal)   Pulse 72   Temp 98.2 F (36.8 C) (Temporal)   Ht 5' 1.5" (1.562 m)   Wt 153 lb 3.2 oz (69.5 kg)   SpO2 97%   BMI 28.48 kg/m  General: no acute distress , A&Ox3 HEENT: PEERL, conjunctiva normal, Oropharynx moist,neck is supple Cardiovascular:  RRR without murmur or gallop.  Respiratory:  Good breath sounds bilaterally, CTAB with normal respiratory effort, no cvat Gastrointestinal: soft, flat abdomen, normal active bowel sounds, no palpable masses, no hepatosplenomegaly, no appreciated hernias GYN: right bartholin gland with small papule and redness w/o warmth, ttp or fluctuance. Normal introitus. No suprapubic ttp Skin:  Warm, no rashes     Commons side effects, risks, benefits, and alternatives for medications and treatment plan prescribed today were discussed, and the patient expressed understanding of the given instructions. Patient is instructed to call or message via MyChart if he/she has any questions or concerns regarding our treatment plan. No barriers to understanding were identified. We discussed Red Flag symptoms and signs in detail. Patient expressed understanding regarding what to do in case of urgent or emergency type symptoms.   Medication list was reconciled, printed and provided to the patient in AVS. Patient instructions and summary information was reviewed with the patient as documented in the AVS. This  note was prepared with assistance of Dragon voice recognition software. Occasional wrong-word or sound-a-like substitutions may have occurred due to the inherent limitations of voice recognition software

## 2018-09-14 ENCOUNTER — Ambulatory Visit: Payer: Medicare Other | Admitting: Family Medicine

## 2018-09-15 LAB — URINE CULTURE
MICRO NUMBER:: 819049
SPECIMEN QUALITY:: ADEQUATE

## 2018-10-08 ENCOUNTER — Encounter: Payer: Self-pay | Admitting: Family Medicine

## 2018-10-08 DIAGNOSIS — R3129 Other microscopic hematuria: Secondary | ICD-10-CM | POA: Insufficient documentation

## 2019-02-07 ENCOUNTER — Ambulatory Visit: Payer: Medicare Other | Admitting: Family Medicine

## 2019-03-02 ENCOUNTER — Ambulatory Visit: Payer: Medicare PPO | Attending: Internal Medicine

## 2019-03-02 DIAGNOSIS — Z23 Encounter for immunization: Secondary | ICD-10-CM | POA: Insufficient documentation

## 2019-03-02 NOTE — Progress Notes (Signed)
   Covid-19 Vaccination Clinic  Name:  Kristin Bonilla    MRN: [id number] DOB: [birth year]  03/02/2019  Kristin Bonilla was observed post Covid-19 immunization for 15 minutes without incidence. She was provided with Vaccine Information Sheet and instruction to access the V-Safe system.   Kristin Bonilla was instructed to call 911 with any severe reactions post vaccine: Marland Kitchen Difficulty breathing  . Swelling of your face and throat  . A fast heartbeat  . A bad rash all over your body  . Dizziness and weakness    Immunizations Administered    Name Date Dose VIS Date Route   Pfizer COVID-19 Vaccine 03/02/2019 12:37 PM 0.3 mL 12/28/2018 Intramuscular   Manufacturer: [place]   Lot: [id number]   [place]: [id number]

## 2019-03-22 DIAGNOSIS — D229 Melanocytic nevi, unspecified: Secondary | ICD-10-CM | POA: Diagnosis not present

## 2019-03-22 DIAGNOSIS — L570 Actinic keratosis: Secondary | ICD-10-CM | POA: Diagnosis not present

## 2019-03-22 DIAGNOSIS — L821 Other seborrheic keratosis: Secondary | ICD-10-CM | POA: Diagnosis not present

## 2019-03-24 ENCOUNTER — Ambulatory Visit: Payer: Medicare PPO | Attending: Internal Medicine

## 2019-03-24 DIAGNOSIS — Z23 Encounter for immunization: Secondary | ICD-10-CM | POA: Insufficient documentation

## 2019-03-24 NOTE — Progress Notes (Signed)
   Covid-19 Vaccination Clinic  Name:  Kristin Bonilla    MRN: [id number] DOB: 18-Jun-1950  03/24/2019  [name] was observed post Covid-19 immunization for 15 minutes without incident. She was provided with Vaccine Information Sheet and instruction to access the V-Safe system.   Ms. Como was instructed to call 911 with any severe reactions post vaccine: Marland Kitchen Difficulty breathing  . Swelling of face and throat  . A fast heartbeat  . A bad rash all over body  . Dizziness and weakness   Immunizations Administered    Name Date Dose VIS Date Route   Pfizer COVID-19 Vaccine 03/24/2019  3:35 PM 0.3 mL 12/28/2018 Intramuscular   Manufacturer: [place]   Lot: [id number]   [place]: [id number]

## 2019-05-08 DIAGNOSIS — Z1231 Encounter for screening mammogram for malignant neoplasm of breast: Secondary | ICD-10-CM | POA: Diagnosis not present

## 2019-05-08 DIAGNOSIS — Z01419 Encounter for gynecological examination (general) (routine) without abnormal findings: Secondary | ICD-10-CM | POA: Diagnosis not present

## 2019-05-08 DIAGNOSIS — Z6829 Body mass index (BMI) 29.0-29.9, adult: Secondary | ICD-10-CM | POA: Diagnosis not present

## 2019-05-08 LAB — HM MAMMOGRAPHY

## 2019-05-24 ENCOUNTER — Other Ambulatory Visit: Payer: Self-pay

## 2019-05-24 ENCOUNTER — Ambulatory Visit (INDEPENDENT_AMBULATORY_CARE_PROVIDER_SITE_OTHER): Payer: Medicare PPO

## 2019-05-24 DIAGNOSIS — Z0000 Encounter for general adult medical examination without abnormal findings: Secondary | ICD-10-CM | POA: Diagnosis not present

## 2019-05-24 NOTE — Patient Instructions (Signed)
Kristin Bonilla , Thank you for taking time to come for your Medicare Wellness Visit. I appreciate your ongoing commitment to your health goals. Please review the following plan we discussed and let me know if I can assist you in the future.   Screening recommendations/referrals: Colorectal Screening: up to date; last colonoscopy 02/22/17 Mammogram: up to date; we will request records from Dr. Sherryle Lis Density: up to date; last 01/18/17  Vision and Dental Exams: Recommended annual ophthalmology exams for early detection of glaucoma and other disorders of the eye Recommended annual dental exams for proper oral hygiene  Vaccinations: Influenza vaccine: completed 09/13/18 Pneumococcal vaccine: up to date; last 01/06/17 Tdap vaccine: up to date; last 11/26/10  Shingles vaccine: Shingrix completed  Covid vaccine: completed   Advanced directives: Please bring a copy of your POA (Power of [place]) and/or Living Will to your next appointment.  Goals: Recommend to drink at least 6-8 8oz glasses of water per day and consume a balanced diet rich in fresh fruits and vegetables.   Next appointment: Please schedule your Annual Wellness Visit with your Nurse Health Advisor in one year.  Preventive Care 70 Years and Older, Female Preventive care refers to lifestyle choices and visits with your health care provider that can promote health and wellness. What does preventive care include?  A yearly physical exam. This is also called an annual well check.  Dental exams once or twice a year.  Routine eye exams. Ask your health care provider how often you should have your eyes checked.  Personal lifestyle choices, including:  Daily care of your teeth and gums.  Regular physical activity.  Eating a healthy diet.  Avoiding tobacco and drug use.  Limiting alcohol use.  Practicing safe sex.  Taking low-dose aspirin every day if recommended by your health care provider.  Taking vitamin and mineral  supplements as recommended by your health care provider. What happens during an annual well check? The services and screenings done by your health care provider during your annual well check will depend on your age, overall health, lifestyle risk factors, and family history of disease. Counseling  Your health care provider may ask you questions about your:  Alcohol use.  Tobacco use.  Drug use.  Emotional well-being.  Home and relationship well-being.  Sexual activity.  Eating habits.  History of falls.  Memory and ability to understand (cognition).  Work and work Statistician.  Reproductive health. Screening  You may have the following tests or measurements:  Height, weight, and BMI.  Blood pressure.  Lipid and cholesterol levels. These may be checked every 5 years, or more frequently if you are over 56 years old.  Skin check.  Lung cancer screening. You may have this screening every year starting at age 17 if you have a 30-pack-year history of smoking and currently smoke or have quit within the past 15 years.  Fecal occult blood test (FOBT) of the stool. You may have this test every year starting at age 48.  Flexible sigmoidoscopy or colonoscopy. You may have a sigmoidoscopy every 5 years or a colonoscopy every 10 years starting at age 8.  Hepatitis C blood test.  Hepatitis B blood test.  Sexually transmitted disease (STD) testing.  Diabetes screening. This is done by checking your blood sugar (glucose) after you have not eaten for a while (fasting). You may have this done every 1-3 years.  Bone density scan. This is done to screen for osteoporosis. You may have this done  starting at age 70.  Mammogram. This may be done every 1-2 years. Talk to your health care provider about how often you should have regular mammograms. Talk with your health care provider about your test results, treatment options, and if necessary, the need for more tests. Vaccines  Your  health care provider may recommend certain vaccines, such as:  Influenza vaccine. This is recommended every year.  Tetanus, diphtheria, and acellular pertussis (Tdap, Td) vaccine. You may need a Td booster every 10 years.  Zoster vaccine. You may need this after age 82.  Pneumococcal 13-valent conjugate (PCV13) vaccine. One dose is recommended after age 23.  Pneumococcal polysaccharide (PPSV23) vaccine. One dose is recommended after age 45. Talk to your health care provider about which screenings and vaccines you need and how often you need them. This information is not intended to replace advice given to you by your health care provider. Make sure you discuss any questions you have with your health care provider. Document Released: 01/30/2015 Document Revised: 09/23/2015 Document Reviewed: 11/04/2014 Elsevier Interactive Patient Education  2017 [place] Prevention in the Home Falls can cause injuries. They can happen to people of all ages. There are many things you can do to make your home safe and to help prevent falls. What can I do on the outside of my home?  Regularly fix the edges of walkways and driveways and fix any cracks.  Remove anything that might make you trip as you walk through a door, such as a raised step or threshold.  Trim any bushes or trees on the path to your home.  Use bright outdoor lighting.  Clear any walking paths of anything that might make someone trip, such as rocks or tools.  Regularly check to see if handrails are loose or broken. Make sure that both sides of any steps have handrails.  Any raised decks and porches should have guardrails on the edges.  Have any leaves, snow, or ice cleared regularly.  Use sand or salt on walking paths during winter.  Clean up any spills in your garage right away. This includes oil or grease spills. What can I do in the bathroom?  Use night lights.  Install grab bars by the toilet and in the tub and  shower. Do not use towel bars as grab bars.  Use non-skid mats or decals in the tub or shower.  If you need to sit down in the shower, use a plastic, non-slip stool.  Keep the floor dry. Clean up any water that spills on the floor as soon as it happens.  Remove soap buildup in the tub or shower regularly.  Attach bath mats securely with double-sided non-slip rug tape.  Do not have throw rugs and other things on the floor that can make you trip. What can I do in the bedroom?  Use night lights.  Make sure that you have a light by your bed that is easy to reach.  Do not use any sheets or blankets that are too big for your bed. They should not hang down onto the floor.  Have a firm chair that has side arms. You can use this for support while you get dressed.  Do not have throw rugs and other things on the floor that can make you trip. What can I do in the kitchen?  Clean up any spills right away.  Avoid walking on wet floors.  Keep items that you use a lot in easy-to-reach places.  If you need to reach something above you, use a strong step stool that has a grab bar.  Keep electrical cords out of the way.  Do not use floor polish or wax that makes floors slippery. If you must use wax, use non-skid floor wax.  Do not have throw rugs and other things on the floor that can make you trip. What can I do with my stairs?  Do not leave any items on the stairs.  Make sure that there are handrails on both sides of the stairs and use them. Fix handrails that are broken or loose. Make sure that handrails are as long as the stairways.  Check any carpeting to make sure that it is firmly attached to the stairs. Fix any carpet that is loose or worn.  Avoid having throw rugs at the top or bottom of the stairs. If you do have throw rugs, attach them to the floor with carpet tape.  Make sure that you have a light switch at the top of the stairs and the bottom of the stairs. If you do not  have them, ask someone to add them for you. What else can I do to help prevent falls?  Wear shoes that:  Do not have high heels.  Have rubber bottoms.  Are comfortable and fit you well.  Are closed at the toe. Do not wear sandals.  If you use a stepladder:  Make sure that it is fully opened. Do not climb a closed stepladder.  Make sure that both sides of the stepladder are locked into place.  Ask someone to hold it for you, if possible.  Clearly mark and make sure that you can see:  Any grab bars or handrails.  First and last steps.  Where the edge of each step is.  Use tools that help you move around (mobility aids) if they are needed. These include:  Canes.  Walkers.  Scooters.  Crutches.  Turn on the lights when you go into a dark area. Replace any light bulbs as soon as they burn out.  Set up your furniture so you have a clear path. Avoid moving your furniture around.  If any of your floors are uneven, fix them.  If there are any pets around you, be aware of where they are.  Review your medicines with your doctor. Some medicines can make you feel dizzy. This can increase your chance of falling. Ask your doctor what other things that you can do to help prevent falls. This information is not intended to replace advice given to you by your health care provider. Make sure you discuss any questions you have with your health care provider. Document Released: 10/30/2008 Document Revised: 06/11/2015 Document Reviewed: 02/07/2014 Elsevier Interactive Patient Education  2017 [employer].

## 2019-05-24 NOTE — Progress Notes (Signed)
This visit is being conducted via phone call due to the COVID-19 pandemic. This patient has given me verbal consent via phone to conduct this visit, patient states they are participating from their home address. Some vital signs may be absent or patient reported.   Patient identification: identified by name, DOB, and current address.  Location provider: [facility], Office Persons participating in the virtual visit: Kristin George LPN, patient, spouse Kristin Bonilla), and Kristin Bonilla    Subjective:   Kristin Bonilla is a 69 y.o. female who presents for Medicare Annual (Subsequent) preventive examination.  Review of Systems:   Cardiac Risk Factors include: advanced age (>41mn, >>52women)    Objective:     Vitals: There were no vitals taken for this visit.  There is no height or weight on file to calculate BMI.  Advanced Directives 05/24/2019 08/01/2017 07/18/2016 01/19/2016 11/01/2014  Does Patient Have a Medical Advance Directive? Yes Yes Yes No No  Type of Advance Directive Living will;Healthcare Power of [place] will - -  Does patient want to make changes to medical advance directive? No - Patient declined No - Patient declined - - -  Copy of [place] Chart? No - copy requested No - copy requested No - copy requested - -  Would patient like information on creating a medical advance directive? - - - - No - patient declined information    Tobacco Social History   Tobacco Use  Smoking Status Never Smoker  Smokeless Tobacco Never Used     Counseling given: Not Answered   Clinical Intake:  Pre-visit preparation completed: Yes  Pain : No/denies pain  Diabetes: No  How often do you need to have someone help you when you read instructions, pamphlets, or other written materials from your doctor or pharmacy?: 1 - Never  Interpreter Needed?: No  Information entered by :: CDenman George LPN  Past Medical History:  Diagnosis Date  . Arthritis   . Cataracts, bilateral 06/14/2018  . GERD (gastroesophageal reflux disease)   . Hyperlipidemia   . 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 Meniere disease   . Menopause   . Nuclear sclerosis 06/14/2018   History reviewed. No pertinent surgical history. Family History  Problem Relation Age of Onset  . Cancer Father        melanoma  . Heart disease Father        Died at 626 . Diverticulosis Father   . COPD Mother   . Osteoporosis Mother   . Hiatal hernia Mother   . Stroke Paternal Grandmother   . Heart disease Other        died 679 around age 18-CABG, smoker  . GER disease Son   . Mental retardation Son   . Heart disease Son    Social History   Socioeconomic History  . Marital status: Married    Spouse name: Not on file  . Number of children: Not on file  . Years of education: Not on file  . Highest education level: Not on file  Occupational History  . Not on file  Tobacco Use  . Smoking status: Never Smoker  . Smokeless tobacco: Never Used  Substance and Sexual Activity  . Alcohol use: No    Alcohol/week: 0.0 standard drinks  . Drug use: No  . Sexual activity: Yes  Other Topics Concern  . Not on file  Social History Narrative  Married. 2 children (son is special needs-in group home living). 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 Difficulty of Paying Living Expenses:   Food Insecurity:   . Worried About Charity fundraiser in the Last Year:   . Arboriculturist in the Last Year:   Transportation Needs:   . Film/video editor (Medical):   Marland Kitchen Lack of Transportation (Non-Medical):   Physical Activity:   . Days of Exercise per Week:   . Minutes of Exercise per Session:   Stress:   . Feeling of Stress :   Social Connections:   .  Frequency of Communication with Friends and Family:   . Frequency of Social Gatherings with Friends and Family:   . Attends Religious Services:   . Active Member of Clubs or Organizations:   . Attends Archivist Meetings:   Marland Kitchen Marital Status:     Outpatient Encounter Medications as of 05/24/2019  Medication Sig  . acetaminophen (TYLENOL) 500 MG tablet Take 1,000 mg by mouth daily as needed for mild pain or headache.  Marland Kitchen aspirin EC 81 MG tablet Take 81 mg by mouth daily.  . Biotin 5000 MCG CAPS Take 5,000 mcg by mouth daily.  . Calcium Carbonate-Vit D-Min (CALCIUM 1200 PO) Take 1 tablet by mouth daily.   Marland Kitchen desvenlafaxine (PRISTIQ) 50 MG 24 hr tablet Take 1 tablet (50 mg total) by mouth daily.  . fenofibrate 160 MG tablet Take 1 tablet (160 mg total) by mouth daily.  Marland Kitchen levocetirizine (XYZAL) 5 MG tablet Take 1 tablet (5 mg total) by mouth every evening.  . Melatonin 5 MG TABS Take 1 tablet by mouth at bedtime.  . montelukast (SINGULAIR) 10 MG tablet Take 1 tablet (10 mg total) by mouth at bedtime.  . Multiple Vitamin (MULTIVITAMIN) tablet Take 1 tablet by mouth daily.  . Omega-3 Fatty Acids (FISH OIL BURP-LESS PO) Take 1 capsule by mouth daily.  Marland Kitchen omeprazole (PRILOSEC) 20 MG capsule Take 1 capsule (20 mg total) by mouth daily. Trial 33m- if not effective can take 2 and we can resend 467mdose  . polyethylene glycol powder (MIRALAX) powder Take 17 g by mouth daily as needed for mild constipation.   . Probiotic Product (PROBIOTIC ADVANCED PO) Take by mouth daily.  . traZODone (DESYREL) 100 MG tablet Take 1 tablet (100 mg total) by mouth at bedtime.  . Marland Kitchenoster Vaccine Adjuvanted ([facility] Ltdinjection Shingrix (PF) 50 mcg/0.5 mL intramuscular suspension, kit  ADM 0.5ML IM UTD  . [DISCONTINUED] cetirizine (ZYRTEC) 10 MG tablet Take 10 mg by mouth daily.     No facility-administered encounter medications on file as of 05/24/2019.    Activities of Daily Living In your present state of  health, do you have any difficulty performing the following activities: 05/24/2019  Hearing? N  Vision? N  Difficulty concentrating or making decisions? N  Walking or climbing stairs? N  Dressing or bathing? N  Doing errands, shopping? N  Preparing Food and eating ? N  Using the Toilet? N  In the past six months, have you accidently leaked urine? N  Do you have problems with loss of bowel control? N  Managing your Medications? N  Managing your Finances? N  Housekeeping or managing your Housekeeping? N  Some recent data might be  hidden    Patient Care Team: Marin Olp, MD as PCP - General (Family Medicine) Dian Queen, MD as Consulting Physician (Obstetrics and Gynecology) Erlene Quan [place] Calix, MD as Referring Physician (Internal Medicine) Warren Danes, PA-C as Physician Assistant (Dermatology) Hortencia Pilar, MD as Consulting Physician (Surgery)    Assessment:   This is a routine wellness examination for [facility].  Exercise Activities and Dietary recommendations Current Exercise Habits: Home exercise routine, Type of exercise: walking, Time (Minutes): 30, Frequency (Times/Week): 3, Weekly Exercise (Minutes/Week): 90, Intensity: Mild  Goals    . Increase physical activity     Wants to increase social circle and find a church home     . Weight (lb) < 150 lb (68 kg)     Lose weight by following diet provided by nutritionist.        Fall Risk Fall Risk  05/24/2019 08/07/2018 08/01/2017 07/18/2016 07/12/2016  Falls in the past year? 0 0 No No No  Number falls in past yr: 0 - - - -  Injury with Fall? 0 - - - -  Follow up Falls evaluation completed;Education provided;Falls prevention discussed - - - -   Is the patient's home free of loose throw rugs in walkways, pet beds, electrical cords, etc?   yes      Grab bars in the bathroom? yes      Handrails on the stairs?   yes      Adequate lighting?   yes  Depression Screen PHQ 2/9 Scores 05/24/2019 07/27/2018 01/19/2018  01/19/2018  PHQ - 2 Score 0 1 0 0  PHQ- 9 Score - 2 0 -     Cognitive Function-no cognitive concerns at this time (handouts provided on memory boosting)    6CIT Screen 05/24/2019 08/01/2017  What Year? 0 points 0 points  What month? 0 points 0 points  What time? 0 points 0 points  Count back from 20 0 points 0 points  Months in reverse 0 points 0 points  Repeat phrase 0 points -  Total Score 0 -    Immunization History  Administered Date(s) Administered  . Fluad Quad(high Dose 65+) 09/13/2018  . Influenza Split 11/26/2010, 12/12/2011  . Influenza Whole 10/18/2006, 11/10/2008, 11/24/2009  . Influenza, High Dose Seasonal PF 11/27/2015  . Influenza,inj,Quad PF,6+ Mos 12/12/2012, 10/29/2013, 12/03/2014  . Influenza-Unspecified 10/22/2016, 09/18/2017  . PFIZER SARS-COV-2 Vaccination 03/02/2019, 03/24/2019  . Pneumococcal Conjugate-13 12/03/2014  . Pneumococcal Polysaccharide-23 12/12/2011, 01/06/2017  . Td 01/18/2000  . Tdap 11/26/2010  . Zoster 04/09/2012  . Zoster Recombinat (Shingrix) 09/10/2017, 11/10/2017    Qualifies for Shingles Vaccine? Shingrix completed   Screening Tests Health Maintenance  Topic Date Due  . INFLUENZA VACCINE  08/18/2019  . MAMMOGRAM  03/12/2020  . TETANUS/TDAP  11/25/2020  . COLONOSCOPY  03/14/2022  . DEXA SCAN  Completed  . COVID-19 Vaccine  Completed  . Hepatitis C Screening  Completed  . PNA vac Low Risk Adult  Completed    Cancer Screenings: Lung: Low Dose CT Chest recommended if Age 75-80 years, 30 pack-year currently smoking OR have quit w/in 15years. Patient does not qualify. Breast:  Up to date on Mammogram? Yes   Up to date of Bone Density/Dexa? Yes Colorectal: colonoscopy 03/14/17     Plan:  I have personally reviewed and addressed the Medicare Annual Wellness questionnaire and have noted the following in the patient's chart:  A. Medical and social history B. Use of alcohol, tobacco or illicit drugs  C.  Current medications and  supplements D. Functional ability and status E.  Nutritional status F.  Physical activity G. Advance directives H. List of other physicians I.  Hospitalizations, surgeries, and ER visits in previous 12 months J.  [place] such as hearing and vision if needed, cognitive and depression L. Referrals, records requested, and appointments- none    In addition, I have reviewed and discussed with patient certain preventive protocols, quality metrics, and best practice recommendations. A written personalized care plan for preventive services as well as general preventive health recommendations were provided to patient.   Signed,  Kristin George, LPN  Nurse Health Advisor   Nurse Notes: no additional

## 2019-05-31 ENCOUNTER — Ambulatory Visit (INDEPENDENT_AMBULATORY_CARE_PROVIDER_SITE_OTHER): Payer: Medicare PPO | Admitting: Family Medicine

## 2019-05-31 ENCOUNTER — Other Ambulatory Visit: Payer: Self-pay | Admitting: *Deleted

## 2019-05-31 ENCOUNTER — Other Ambulatory Visit: Payer: Self-pay

## 2019-05-31 ENCOUNTER — Encounter: Payer: Self-pay | Admitting: Family Medicine

## 2019-05-31 VITALS — BP 130/82 | HR 72 | Temp 98.4°F | Ht 61.5 in | Wt 155.5 lb

## 2019-05-31 DIAGNOSIS — F325 Major depressive disorder, single episode, in full remission: Secondary | ICD-10-CM | POA: Diagnosis not present

## 2019-05-31 DIAGNOSIS — D72819 Decreased white blood cell count, unspecified: Secondary | ICD-10-CM

## 2019-05-31 DIAGNOSIS — E785 Hyperlipidemia, unspecified: Secondary | ICD-10-CM

## 2019-05-31 DIAGNOSIS — N183 Chronic kidney disease, stage 3 unspecified: Secondary | ICD-10-CM

## 2019-05-31 DIAGNOSIS — Z79899 Other long term (current) drug therapy: Secondary | ICD-10-CM

## 2019-05-31 DIAGNOSIS — M85851 Other specified disorders of bone density and structure, right thigh: Secondary | ICD-10-CM

## 2019-05-31 LAB — COMPREHENSIVE METABOLIC PANEL
ALT: 20 U/L (ref 0–35)
AST: 28 U/L (ref 0–37)
Albumin: 4.5 g/dL (ref 3.5–5.2)
Alkaline Phosphatase: 57 U/L (ref 39–117)
BUN: 18 mg/dL (ref 6–23)
CO2: 30 mEq/L (ref 19–32)
Calcium: 10.4 mg/dL (ref 8.4–10.5)
Chloride: 103 mEq/L (ref 96–112)
Creatinine, Ser: 0.89 mg/dL (ref 0.40–1.20)
GFR: 62.72 mL/min (ref >60.00)
Glucose, Bld: 99 mg/dL (ref 70–99)
Potassium: 3.9 mEq/L (ref 3.5–5.1)
Sodium: 140 mEq/L (ref 135–145)
Total Bilirubin: 0.5 mg/dL (ref 0.2–1.2)
Total Protein: 6.6 g/dL (ref 6.0–8.3)

## 2019-05-31 LAB — POC URINALSYSI DIPSTICK (AUTOMATED)
Bilirubin, UA: NEGATIVE
Blood, UA: NEGATIVE
Glucose, UA: NEGATIVE
Ketones, UA: NEGATIVE
Leukocytes, UA: NEGATIVE
Nitrite, UA: NEGATIVE
Protein, UA: NEGATIVE
Spec Grav, UA: 1.02 (ref 1.010–1.025)
Urobilinogen, UA: 0.2 E.U./dL
pH, UA: 6.5 (ref 5.0–8.0)

## 2019-05-31 LAB — LIPID PANEL
Cholesterol: 202 mg/dL — ABNORMAL HIGH (ref 0–200)
HDL: 73.1 mg/dL (ref >39.00)
LDL Cholesterol: 111 mg/dL — ABNORMAL HIGH (ref 0–99)
NonHDL: 128.41
Total CHOL/HDL Ratio: 3
Triglycerides: 88 mg/dL (ref 0.0–149.0)
VLDL: 17.6 mg/dL (ref 0.0–40.0)

## 2019-05-31 LAB — CBC WITH DIFFERENTIAL/PLATELET
Basophils Absolute: 0.1 10*3/uL (ref 0.0–0.1)
Basophils Relative: 2.5 % (ref 0.0–3.0)
Eosinophils Absolute: 0.1 10*3/uL (ref 0.0–0.7)
Eosinophils Relative: 3.4 % (ref 0.0–5.0)
HCT: 40.5 % (ref 36.0–46.0)
Hemoglobin: 13.6 g/dL (ref 12.0–15.0)
Lymphocytes Relative: 44.5 % (ref 12.0–46.0)
Lymphs Abs: 1.8 10*3/uL (ref 0.7–4.0)
MCHC: 33.5 g/dL (ref 30.0–36.0)
MCV: 90.3 fl (ref 78.0–100.0)
Monocytes Absolute: 0.4 10*3/uL (ref 0.1–1.0)
Monocytes Relative: 9.4 % (ref 3.0–12.0)
Neutro Abs: 1.6 10*3/uL (ref 1.4–7.7)
Neutrophils Relative %: 40.2 % — ABNORMAL LOW (ref 43.0–77.0)
Platelets: 267 10*3/uL (ref 150.0–400.0)
RBC: 4.49 Mil/uL (ref 3.87–5.11)
RDW: 13.7 % (ref 11.5–15.5)
WBC: 4.1 10*3/uL (ref 4.0–10.5)

## 2019-05-31 LAB — VITAMIN B12: Vitamin B-12: 438 pg/mL (ref 211–911)

## 2019-05-31 MED ORDER — DESVENLAFAXINE SUCCINATE ER 50 MG PO TB24: 50.0000 mg | ORAL_TABLET | Freq: Every day | ORAL | 3 refills | Status: DC

## 2019-05-31 MED ORDER — TRAZODONE HCL 100 MG PO TABS: 100.0000 mg | ORAL_TABLET | Freq: Every day | ORAL | 3 refills | Status: DC

## 2019-05-31 MED ORDER — FENOFIBRATE 160 MG PO TABS: 160.0000 mg | ORAL_TABLET | Freq: Every day | ORAL | 3 refills | Status: DC

## 2019-05-31 MED ORDER — MONTELUKAST SODIUM 10 MG PO TABS: 10.0000 mg | ORAL_TABLET | Freq: Every day | ORAL | 3 refills | Status: DC

## 2019-05-31 MED ORDER — OMEPRAZOLE 20 MG PO CPDR: 20.0000 mg | DELAYED_RELEASE_CAPSULE | Freq: Every day | ORAL | 3 refills | Status: DC

## 2019-05-31 MED ORDER — LEVOCETIRIZINE DIHYDROCHLORIDE 5 MG PO TABS: 5.0000 mg | ORAL_TABLET | Freq: Every evening | ORAL | 3 refills | Status: DC

## 2019-05-31 NOTE — Addendum Note (Signed)
Addended by: Christiana Fuchs on: 05/31/2019 11:34 AM   Modules accepted: Orders

## 2019-05-31 NOTE — Patient Instructions (Addendum)
Encouraged her to try voltaren gel instead of oral nsaids like aleve. biofreeze helps some- ok to use that as well   Schedule your bone density test at check out desk. You may also call directly to X-ray at [phone] to schedule an appointment that is convenient for you.  - located [address] across the [address] from [place] - in the basement - you do need an appointment for the bone density tests.    Return in about 5 months (around 10/31/2019) for physical or sooner if needed.

## 2019-05-31 NOTE — Progress Notes (Signed)
Phone [phone] In person visit    I,Donna Orphanos,acting as a scribe for Garret Reddish, MD.,have documented all relevant documentation on the behalf of Garret Reddish, MD,as directed by  Garret Reddish, MD while in the presence of Garret Reddish, MD.  Subjective:   Kristin Bonilla is a 69 y.o. year old very pleasant female patient who presents for/with See problem oriented charting Chief Complaint  Patient presents with  . Gastroesophageal Reflux  . Hyperlipidemia    This visit occurred during the SARS-CoV-2 public health emergency.  Safety protocols were in place, including screening questions prior to the visit, additional usage of staff PPE, and extensive cleaning of exam room while observing appropriate contact time as indicated for disinfecting solutions.   Past Medical History-  Patient Active Problem List   Diagnosis Date Noted  . Hematuria, microscopic 10/08/2018  . Cataracts, bilateral 06/14/2018  . Nuclear sclerosis 06/14/2018  . Allergic rhinitis 07/18/2017  . Osteopenia 12/03/2014  . Leukopenia 05/29/2014  . Insomnia 05/29/2014  . CKD (chronic kidney disease), stage III 12/20/2013  . Meniere disease 12/20/2013  . Alopecia 04/27/2009  . GERD (gastroesophageal reflux disease) 01/09/2008  . Irritable bowel syndrome 01/09/2008  . Major depression in full remission ([place]) 06/07/2007  . Hyperlipidemia 10/25/2006    Medications- reviewed and updated Current Outpatient Medications  Medication Sig Dispense Refill  . acetaminophen (TYLENOL) 500 MG tablet Take 1,000 mg by mouth daily as needed for mild pain or headache.    Marland Kitchen aspirin EC 81 MG tablet Take 81 mg by mouth daily.    . Biotin 5000 MCG CAPS Take 5,000 mcg by mouth daily.    . Calcium Carbonate-Vit D-Min (CALCIUM 1200 PO) Take 1 tablet by mouth daily.     Marland Kitchen desvenlafaxine (PRISTIQ) 50 MG 24 hr tablet Take 1 tablet (50 mg total) by mouth daily. 90 tablet 3  . fenofibrate 160 MG tablet Take 1 tablet (160 mg  total) by mouth daily. 90 tablet 3  . levocetirizine (XYZAL) 5 MG tablet Take 1 tablet (5 mg total) by mouth every evening. 90 tablet 3  . Melatonin 5 MG TABS Take 1 tablet by mouth at bedtime.    . montelukast (SINGULAIR) 10 MG tablet Take 1 tablet (10 mg total) by mouth at bedtime. 90 tablet 3  . Multiple Vitamin (MULTIVITAMIN) tablet Take 1 tablet by mouth daily.    . Omega-3 Fatty Acids (FISH OIL BURP-LESS PO) Take 1 capsule by mouth daily.    Marland Kitchen omeprazole (PRILOSEC) 20 MG capsule Take 1 capsule (20 mg total) by mouth daily. Trial 34m- if not effective can take 2 and we can resend 451mdose 90 capsule 3  . polyethylene glycol powder (MIRALAX) powder Take 17 g by mouth daily as needed for mild constipation.     . Probiotic Product (PROBIOTIC ADVANCED PO) Take by mouth daily.    . traZODone (DESYREL) 100 MG tablet Take 1 tablet (100 mg total) by mouth at bedtime. 90 tablet 3   No current facility-administered medications for this visit.     Objective:  BP 130/82 (BP Location: Left Arm, Patient Position: Sitting, Cuff Size: Normal)   Pulse 72   Temp 98.4 F (36.9 C) (Temporal)   Ht 5' 1.5" (1.562 m)   Wt 155 lb 8 oz (70.5 kg)   SpO2 97%   BMI 28.91 kg/m  Gen: NAD, resting comfortably CV: RRR no murmurs rubs or gallops Lungs: CTAB no crackles, wheeze, rhonchi Ext: no edema Skin: warm, dry  Assessment and Plan   # GERD S:medication: Omeprazole 20 mg daily, rarely needs to take 40 mg. Medication is working well.  Do not think we can reduce dose further-ideally we would reduce this given osteopenia.   B12 levels related to PPI use: Lab Results  Component Value Date   VZSMOLMB86 754 01/19/2016  A/P: reasonable control-I do not think we can decrease her to Pepcid at this time given intermittent breakthrough on omeprazole.  We will continue current medication.  Update B12 level with labs today.   #Osteopenia-offered repeat bone density today. Takes vitamin D. Encouraged weight  bearing exercise.   #Isolated leukopenia -leukopenia stable on last labs.  Pathology smear review was reassuring.  Update CBC with differential to make sure not worsening-consider hematology consult if other cell lines worsening  Patient has seen Dr. Irene Limbo January 19, 2016-he thought PPI use could be contributing factor.  No further work-up or bone marrow biopsy was recommended.  LDH, B12, TSH, folate, hepatitis C were normal. Lab Results  Component Value Date   WBC 3.4 (L) 01/19/2018   HGB 13.9 01/19/2018   HCT 42.0 01/19/2018   MCV 90.2 01/19/2018   PLT 285.0 01/19/2018   CKD stage III S: GFR has been have been stable in the 50s A/P: Hopefully stable.  Update CMP with labs today. - advised to avoid nsaids orally. Encouraged her to try voltaren gel instead of oral nsaids like aleve. biofreeze helps some- ok to use that as well  -has some arthritic issues in fingers and wrist  #hyperlipidemia S: Medication: Fish Oil, Fenofibrate 160 mg.  Also takes aspirin for primary prevention Lab Results  Component Value Date   CHOL 187 08/07/2018   HDL 77.50 08/07/2018   LDLCALC 95 08/07/2018   LDLDIRECT 110.4 12/05/2011   TRIG 74.0 08/07/2018   CHOLHDL 2 08/07/2018   A/P: update lipid panel today  #Hematuria-saw Dr. Louis Meckel for gross hematuria  We will get urinalysis with labs today. Has had cystoscopy and CT in the past  # Depression/insomnia S: Medication: Pristiq 50 mg.  Also takes trazodone for sleep Depression screen [facility] 2/9 05/24/2019 07/27/2018 01/19/2018  Decreased Interest 0 0 0  Down, Depressed, Hopeless 0 1 0  PHQ - 2 Score 0 1 0  Altered sleeping - 0 0  Tired, decreased energy - 0 0  Change in appetite - 1 0  Feeling bad or failure about yourself  - 0 0  Trouble concentrating - 0 0  Moving slowly or fidgety/restless - 0 0  Suicidal thoughts - 0 0  PHQ-9 Score - 2 0  Difficult doing work/chores - Not difficult at all Not difficult at all  A/P: Depression is in full remission.   Continue Pristiq 50 mg.  Insomnia is reasonably controlled on trazodone-continue current medication.  #Seasonal allergies-patient compliant with Xyzal and Singulair.  Working pretty well.  We did discuss Singulair blackbox warning of mood issues  #Mnire's disease-ongoing tinnitus and hearing loss in the right ear-no recent worsening. Doesn't hear out of right ear. ENT took her off maxzide- does not see them at this point- can follow up if needed.   Recommended follow up: Return in about 5 months (around 10/31/2019) for physical or sooner if needed.  Future Appointments  Date Time Provider Agency  05/31/2019 10:40 AM Marin Olp, MD [facility]  03/20/2020 10:00 AM [place], Ronalee Red, PA-C [facility]    Lab/Order associations: No diagnosis found.  No orders of the defined types  were placed in this encounter.    The entirety of the information documented in the History of Present Illness, Review of Systems and Physical Exam were personally obtained by me. Portions of this information were initially documented by the CMA and reviewed by me for thoroughness and accuracy.   Garret Reddish, MD   Return precautions advised.  Anselmo Pickler, LPN

## 2019-06-03 ENCOUNTER — Encounter: Payer: Self-pay | Admitting: Family Medicine

## 2019-06-28 ENCOUNTER — Encounter: Payer: Self-pay | Admitting: Family Medicine

## 2019-06-28 DIAGNOSIS — H3509 Other intraretinal microvascular abnormalities: Secondary | ICD-10-CM | POA: Diagnosis not present

## 2019-06-28 DIAGNOSIS — H35362 Drusen (degenerative) of macula, left eye: Secondary | ICD-10-CM | POA: Diagnosis not present

## 2019-06-28 DIAGNOSIS — H25013 Cortical age-related cataract, bilateral: Secondary | ICD-10-CM | POA: Diagnosis not present

## 2019-06-28 DIAGNOSIS — H2513 Age-related nuclear cataract, bilateral: Secondary | ICD-10-CM | POA: Diagnosis not present

## 2019-06-28 LAB — HM DIABETES EYE EXAM

## 2019-10-29 NOTE — Progress Notes (Signed)
Phone [phone] In person visit   Subjective:   Kristin Bonilla is a 69 y.o. year old very pleasant female patient who presents for/with See problem oriented charting Chief Complaint  Patient presents with  . Hyperlipidemia    This visit occurred during the SARS-CoV-2 public health emergency.  Safety protocols were in place, including screening questions prior to the visit, additional usage of staff PPE, and extensive cleaning of exam room while observing appropriate contact time as indicated for disinfecting solutions.   Past Medical History-  Patient Active Problem List   Diagnosis Date Noted  . Leukopenia 05/29/2014    Priority: Medium  . CKD (chronic kidney disease), stage III ([place]) 12/20/2013    Priority: Medium  . Meniere disease 12/20/2013    Priority: Medium  . Major depression in full remission ([place]) 06/07/2007    Priority: Medium  . Hyperlipidemia 10/25/2006    Priority: Medium  . Allergic rhinitis 07/18/2017    Priority: Low  . Osteopenia 12/03/2014    Priority: Low  . Insomnia 05/29/2014    Priority: Low  . Alopecia 04/27/2009    Priority: Low  . GERD (gastroesophageal reflux disease) 01/09/2008    Priority: Low  . Irritable bowel syndrome 01/09/2008    Priority: Low  . Hematuria, microscopic 10/08/2018  . Cataracts, bilateral 06/14/2018  . Nuclear sclerosis 06/14/2018    Medications- reviewed and updated Current Outpatient Medications  Medication Sig Dispense Refill  . acetaminophen (TYLENOL) 500 MG tablet Take 1,000 mg by mouth daily as needed for mild pain or headache.    Marland Kitchen aspirin EC 81 MG tablet Take 81 mg by mouth daily.    . Biotin 5000 MCG CAPS Take 5,000 mcg by mouth daily.    . Calcium Carbonate-Vit D-Min (CALCIUM 1200 PO) Take 1 tablet by mouth daily.     Marland Kitchen desvenlafaxine (PRISTIQ) 50 MG 24 hr tablet Take 1 tablet (50 mg total) by mouth daily. 90 tablet 3  . fenofibrate 160 MG tablet Take 1 tablet (160 mg total) by mouth daily. 90 tablet  3  . levocetirizine (XYZAL) 5 MG tablet Take 1 tablet (5 mg total) by mouth every evening. 90 tablet 3  . Melatonin 5 MG TABS Take 1 tablet by mouth at bedtime.    . montelukast (SINGULAIR) 10 MG tablet Take 1 tablet (10 mg total) by mouth at bedtime. 90 tablet 3  . Multiple Vitamin (MULTIVITAMIN) tablet Take 1 tablet by mouth daily.    . Omega-3 Fatty Acids (FISH OIL BURP-LESS PO) Take 1 capsule by mouth daily.    Marland Kitchen omeprazole (PRILOSEC) 20 MG capsule Take 1-2 capsules (20-40 mg total) by mouth daily. 180 capsule 3  . polyethylene glycol powder (MIRALAX) powder Take 17 g by mouth daily as needed for mild constipation.     . Probiotic Product (PROBIOTIC ADVANCED PO) Take by mouth daily.    . traZODone (DESYREL) 100 MG tablet Take 1 tablet (100 mg total) by mouth at bedtime. 90 tablet 3   No current facility-administered medications for this visit.     Objective:  BP 116/60   Pulse 73   Temp 97.8 F (36.6 C) (Temporal)   Resp 18   Ht 5\' 2"  (1.575 m)   Wt 162 lb 9.6 oz (73.8 kg)   SpO2 96%   BMI 29.74 kg/m  Gen: NAD, resting comfortably CV: RRR no murmurs rubs or gallops Lungs: CTAB no crackles, wheeze, rhonchi Ext: no edema Skin: warm, dry  Assessment and Plan   # Depression S: Medication:pristiq 50mg  XR    some increased arthralgias in last 6 months. Feels some general exhaustion from events of the last few years. PHQ9 of 4.  Depression screen [facility] 2/9 10/31/2019 05/31/2019 05/24/2019  Decreased Interest 0 0 0  Down, Depressed, Hopeless 0 0 0  PHQ - 2 Score 0 0 0  Altered sleeping 3 0 -  Tired, decreased energy 0 1 -  Change in appetite 1 1 -  Feeling bad or failure about yourself  0 0 -  Trouble concentrating 0 0 -  Moving slowly or fidgety/restless 0 0 -  Suicidal thoughts 0 0 -  PHQ-9 Score 4 2 -  Difficult doing work/chores - Not difficult at all -  Some recent data might be hidden  A/P:  Reasonable control/full remission with PHQ9 under 5. Still has some life  stress particularly with covid- discussec counseling but she declines for now.  She is of [place] faith and I discussed a book about Depression called out of the cave by Chrisandra Carota to consider reading -Continue Pristiq  #Insomnia -trazodone and melatonin combo - doesn't fall asleep until 1 30 or 2 pm and wakes up 7 30 or 8 - doesn't feel rested.  -I want patient to try lower dose melatonin perhaps 2.5 mg and I also want her to avoid phone/TV for at least 30 minutes to hour before bedtime.  If she does not fall asleep I asked that she get up and go into another room and read with low level of lights until she feels tired -If patient is not improving within 2 to 4 weeks asked her to contact me-may try Belsomra  #hyperlipidemia S: Medication: fenofibrate 160mg  Lab Results  Component Value Date   CHOL 202 (H) 05/31/2019   HDL 73.10 05/31/2019   LDLCALC 111 (H) 05/31/2019   LDLDIRECT 110.4 12/05/2011   TRIG 88.0 05/31/2019   CHOLHDL 3 05/31/2019   A/P: doesn't feel a lot of motivation to lose weight- we discussed if doesn't improve may need to start statin. 10 year ascvd risk last visit was 8.2%.  We will check LDL to see if this can be a motivating factor if elevated  #Chronic kidney disease stage III S: GFR is typically in the 50s range- better last visit -Patient knows to avoid NSAIDs  A/P: hopefully stable- update labs today   # GERD S:Medication: Prilosec 20Mg - rarely has to use 2nd. Failed zantac in the past B12 levels related to PPI use: Lab Results  Component Value Date   VITAMINB12 438 05/31/2019  A/P: stable- continue current meds   # leukopenia in past- ok on last check- will check CBC today  Recommended follow up: Return in about 7 months (around 05/30/2020) for physical or sooner if needed. Future Appointments  Date Time Provider [place]  03/20/2020 10:00 AM Warren Danes, [place] [facility]  06/03/2020 10:40 AM Yong Channel Brayton Mars, MD [facility]     Lab/Order associations:   ICD-10-CM   1. Major depressive disorder with single episode, in full remission ([place])  F32.5   2. Hyperlipidemia, unspecified hyperlipidemia type  E78.5 CBC With Differential/Platelet    COMPLETE METABOLIC PANEL WITH GFR    LDL cholesterol, direct    LDL cholesterol, direct    COMPLETE METABOLIC PANEL WITH GFR    CBC With Differential/Platelet  3. Gastroesophageal reflux disease without esophagitis  K21.9   4. Insomnia, unspecified type  G47.00  Return precautions advised.  Garret Reddish, MD

## 2019-10-31 ENCOUNTER — Ambulatory Visit: Payer: Medicare PPO | Admitting: Family Medicine

## 2019-10-31 ENCOUNTER — Encounter: Payer: Self-pay | Admitting: Family Medicine

## 2019-10-31 ENCOUNTER — Other Ambulatory Visit: Payer: Self-pay

## 2019-10-31 VITALS — BP 116/60 | HR 73 | Temp 97.8°F | Resp 18 | Ht 62.0 in | Wt 162.6 lb

## 2019-10-31 DIAGNOSIS — F325 Major depressive disorder, single episode, in full remission: Secondary | ICD-10-CM | POA: Diagnosis not present

## 2019-10-31 DIAGNOSIS — K219 Gastro-esophageal reflux disease without esophagitis: Secondary | ICD-10-CM

## 2019-10-31 DIAGNOSIS — G4700 Insomnia, unspecified: Secondary | ICD-10-CM | POA: Diagnosis not present

## 2019-10-31 DIAGNOSIS — E785 Hyperlipidemia, unspecified: Secondary | ICD-10-CM

## 2019-10-31 NOTE — Patient Instructions (Addendum)
Please stop by lab before you go If you have mychart- we will send your results within 3 business days of [place] receiving them.  If you do not have mychart- we will call you about results within 5 business days of [place] receiving them.  *please note we are currently using [facility] which has a longer processing time than [place] typically so labs may not come back as quickly as in the past *please also note that you will see labs on mychart as soon as they post. I will later go in and write notes on them- will say "notes from Dr. Yong Channel"  Lets set a goal of even just 5-10 lbs of weight loss. Exercise helps with anxiety/down mood  Consider out of the cave chris hodges  Recommended follow up: Return in about 7 months (around 05/30/2020) for physical or sooner if needed.

## 2019-10-31 NOTE — Assessment & Plan Note (Signed)
#  Insomnia -trazodone and melatonin combo - doesn't fall asleep until 1 30 or 2 pm and wakes up 7 30 or 8 - doesn't feel rested.  -I want patient to try lower dose melatonin perhaps 2.5 mg and I also want her to avoid phone/TV for at least 30 minutes to hour before bedtime.  If she does not fall asleep I asked that she get up and go into another room and read with low level of lights until she feels tired -If patient is not improving within 2 to 4 weeks asked her to contact me-may try Belsomra

## 2019-11-01 LAB — COMPLETE METABOLIC PANEL WITH GFR
AG Ratio: 1.9 (calc) (ref 1.0–2.5)
ALT: 21 U/L (ref 6–29)
AST: 28 U/L (ref 10–35)
Albumin: 4.1 g/dL (ref 3.6–5.1)
Alkaline phosphatase (APISO): 54 U/L (ref 37–153)
BUN: 11 mg/dL (ref 7–25)
CO2: 29 mmol/L (ref 20–32)
Calcium: 9.9 mg/dL (ref 8.6–10.4)
Chloride: 105 mmol/L (ref 98–110)
Creat: 0.89 mg/dL (ref 0.60–0.93)
GFR, Est African American: 76 mL/min/{1.73_m2} (ref >=60)
GFR, Est Non African American: 66 mL/min/{1.73_m2} (ref >=60)
Globulin: 2.2 g/dL (calc) (ref 1.9–3.7)
Glucose, Bld: 90 mg/dL (ref 65–99)
Potassium: 4.4 mmol/L (ref 3.5–5.3)
Sodium: 141 mmol/L (ref 135–146)
Total Bilirubin: 0.4 mg/dL (ref 0.2–1.2)
Total Protein: 6.3 g/dL (ref 6.1–8.1)

## 2019-11-01 LAB — CBC WITH DIFFERENTIAL/PLATELET
Absolute Monocytes: 428 {cells}/uL (ref 200–950)
Basophils Absolute: 68 {cells}/uL (ref 0–200)
Basophils Relative: 2.2 %
Eosinophils Absolute: 239 {cells}/uL (ref 15–500)
Eosinophils Relative: 7.7 %
HCT: 37.1 % (ref 35.0–45.0)
Hemoglobin: 12.4 g/dL (ref 11.7–15.5)
Lymphs Abs: 1293 {cells}/uL (ref 850–3900)
MCH: 30 pg (ref 27.0–33.0)
MCHC: 33.4 g/dL (ref 32.0–36.0)
MCV: 89.6 fL (ref 80.0–100.0)
MPV: 10.4 fL (ref 7.5–12.5)
Monocytes Relative: 13.8 %
Neutro Abs: 1073 {cells}/uL — ABNORMAL LOW (ref 1500–7800)
Neutrophils Relative %: 34.6 %
Platelets: 252 Thousand/uL (ref 140–400)
RBC: 4.14 Million/uL (ref 3.80–5.10)
RDW: 12.7 % (ref 11.0–15.0)
Total Lymphocyte: 41.7 %
WBC: 3.1 Thousand/uL — ABNORMAL LOW (ref 3.8–10.8)

## 2019-11-01 LAB — LDL CHOLESTEROL, DIRECT: Direct LDL: 99 mg/dL (ref <100)

## 2020-03-20 ENCOUNTER — Ambulatory Visit: Payer: Medicare PPO | Admitting: Physician Assistant

## 2020-03-25 ENCOUNTER — Other Ambulatory Visit: Payer: Self-pay

## 2020-03-25 ENCOUNTER — Encounter: Payer: Self-pay | Admitting: Physician Assistant

## 2020-03-25 ENCOUNTER — Ambulatory Visit: Payer: Medicare PPO | Admitting: Physician Assistant

## 2020-03-25 DIAGNOSIS — L570 Actinic keratosis: Secondary | ICD-10-CM | POA: Diagnosis not present

## 2020-03-25 DIAGNOSIS — D485 Neoplasm of uncertain behavior of skin: Secondary | ICD-10-CM

## 2020-03-25 DIAGNOSIS — L821 Other seborrheic keratosis: Secondary | ICD-10-CM

## 2020-03-25 DIAGNOSIS — Z1283 Encounter for screening for malignant neoplasm of skin: Secondary | ICD-10-CM | POA: Diagnosis not present

## 2020-03-25 NOTE — Patient Instructions (Signed)

## 2020-04-02 ENCOUNTER — Telehealth: Payer: Self-pay

## 2020-04-02 NOTE — Telephone Encounter (Signed)
Phone call to patient with her pathology results. Voicemail left for patient to give the office a call back.  

## 2020-04-02 NOTE — Telephone Encounter (Signed)
Phone call from patient returning my call. Pathology results given to patient.  

## 2020-04-02 NOTE — Telephone Encounter (Signed)
-----   Message from Warren Danes, [place] sent at 04/01/2020  4:33 PM EDT ----- Rtc if recur

## 2020-04-13 ENCOUNTER — Encounter: Payer: Self-pay | Admitting: Physician Assistant

## 2020-04-13 NOTE — Progress Notes (Signed)
   Follow-Up Visit   Subjective  Kristin Bonilla is a 70 y.o. female who presents for the following: Annual Exam (Full body skin check. Spot on left cheek x weeks, red looking, per patient feels like a hole, a dent in skin./Spots on forearms x year rough, raised, not painful, no bleeding./Dry spot on shoulders x year.).   The following portions of the chart were reviewed this encounter and updated as appropriate:  Tobacco  Allergies  Meds  Problems  Med Hx  Surg Hx  Fam Hx      Objective  Well appearing patient in no apparent distress; mood and affect are within normal limits.  A full examination was performed including scalp, head, eyes, ears, nose, lips, neck, chest, axillae, abdomen, back, buttocks, bilateral upper extremities, bilateral lower extremities, hands, feet, fingers, toes, fingernails, and toenails. All findings within normal limits unless otherwise noted below.  Objective  waist up and legs: Full body skin examination  Objective  Left Buccal Cheek: Multiple Stuck-on, waxy, tan-brown papules and plaques. --Discussed benign etiology and prognosis.   Objective  Dorsum of Nose: Pink pearly papule     Objective  Left Forearm - Anterior, Left Shoulder - Posterior, Left Zygomatic Area: Erythematous patches with gritty scale.   Assessment & Plan  Encounter for screening for malignant neoplasm of skin waist up and legs  Yearly skin check  Seborrheic keratosis Left Buccal Cheek  Okay to leave if stable  Neoplasm of uncertain behavior of skin Dorsum of Nose  Skin / nail biopsy Type of biopsy: tangential   Informed consent: discussed and consent obtained   Timeout: patient name, date of birth, surgical site, and procedure verified   Procedure prep:  Patient was prepped and draped in usual sterile fashion (Non sterile) Prep type:  Chlorhexidine Anesthesia: the lesion was anesthetized in a standard fashion   Anesthetic:  1% lidocaine w/ epinephrine  1-100,000 local infiltration Instrument used: flexible razor blade   Outcome: patient tolerated procedure well   Post-procedure details: wound care instructions given    Specimen 1 - Surgical pathology Differential Diagnosis: bcc vs scc  Check Margins: No  AK (actinic keratosis) (3) Left Shoulder - Posterior; Left Forearm - Anterior; Left Zygomatic Area  Destruction of lesion - Left Forearm - Anterior, Left Shoulder - Posterior, Left Zygomatic Area Complexity: simple   Destruction method: cryotherapy   Informed consent: discussed and consent obtained   Timeout:  patient name, date of birth, surgical site, and procedure verified Lesion destroyed using liquid nitrogen: Yes   Cryotherapy cycles:  3 Outcome: patient tolerated procedure well with no complications      I, Neziah Vogelgesang, PA-C, have reviewed all documentation's for this visit.  The documentation on 04/13/20 for the exam, diagnosis, procedures and orders are all accurate and complete.

## 2020-05-13 DIAGNOSIS — Z6828 Body mass index (BMI) 28.0-28.9, adult: Secondary | ICD-10-CM | POA: Diagnosis not present

## 2020-05-13 DIAGNOSIS — Z1231 Encounter for screening mammogram for malignant neoplasm of breast: Secondary | ICD-10-CM | POA: Diagnosis not present

## 2020-05-13 DIAGNOSIS — Z124 Encounter for screening for malignant neoplasm of cervix: Secondary | ICD-10-CM | POA: Diagnosis not present

## 2020-06-01 ENCOUNTER — Ambulatory Visit (INDEPENDENT_AMBULATORY_CARE_PROVIDER_SITE_OTHER): Payer: Medicare PPO

## 2020-06-01 DIAGNOSIS — Z0000 Encounter for general adult medical examination without abnormal findings: Secondary | ICD-10-CM | POA: Diagnosis not present

## 2020-06-01 NOTE — Progress Notes (Signed)
Virtual Visit via Telephone Note  I connected with  Kristin Bonilla on 06/01/20 at 11:00 AM EDT by telephone and verified that I am speaking with the correct person using two identifiers.  Location: Patient: Home Provider: Office Persons participating in the virtual visit: patient/Nurse Health Advisor   I discussed the limitations, risks, security and privacy concerns of performing an evaluation and management service by telephone and the availability of in person appointments. The patient expressed understanding and agreed to proceed.  Interactive audio and video telecommunications were attempted between this nurse and patient, however failed, due to patient having technical difficulties OR patient did not have access to video capability.  We continued and completed visit with audio only.  Some vital signs may be absent or patient reported.   Willette Brace, LPN    Subjective:   STEPANIE Bonilla is a 70 y.o. female who presents for Medicare Annual (Subsequent) preventive examination.  Review of Systems     Cardiac Risk Factors include: advanced age (>59men, >71 women);dyslipidemia     Objective:    Today's Vitals   06/01/20 1110  PainSc: 2    There is no height or weight on file to calculate BMI.  Advanced Directives 06/01/2020 05/24/2019 08/01/2017 07/18/2016 01/19/2016 11/01/2014  Does Patient Have a Medical Advance Directive? Yes Yes Yes Yes No No  Type of Advance Directive Healthcare Power of Attorney Living will;Healthcare Power of [place];Living will - -  Does patient want to make changes to medical advance directive? - No - Patient declined No - Patient declined - - -  Copy of [place] in Chart? No - copy requested No - copy requested No - copy requested No - copy requested - -  Would patient like information on creating a medical advance directive? - - - - - No - patient declined information     Current Medications (verified) Outpatient Encounter Medications as of 06/01/2020  Medication Sig  . acetaminophen (TYLENOL) 500 MG tablet Take 1,000 mg by mouth daily as needed for mild pain or headache.  Marland Kitchen aspirin EC 81 MG tablet Take 81 mg by mouth daily.  . Biotin 5000 MCG CAPS Take 5,000 mcg by mouth daily.  . Calcium Carbonate-Vit D-Min (CALCIUM 1200 PO) Take 1 tablet by mouth daily.  Marland Kitchen desvenlafaxine (PRISTIQ) 50 MG 24 hr tablet Take 1 tablet (50 mg total) by mouth daily.  . fenofibrate 160 MG tablet Take 1 tablet (160 mg total) by mouth daily.  Marland Kitchen levocetirizine (XYZAL) 5 MG tablet Take 1 tablet (5 mg total) by mouth every evening.  . Melatonin 5 MG TABS Take 1 tablet by mouth at bedtime.  . montelukast (SINGULAIR) 10 MG tablet Take 1 tablet (10 mg total) by mouth at bedtime.  . Multiple Vitamin (MULTIVITAMIN) tablet Take 1 tablet by mouth daily.  . Omega-3 Fatty Acids (FISH OIL BURP-LESS PO) Take 1 capsule by mouth daily.  Marland Kitchen omeprazole (PRILOSEC) 20 MG capsule Take 1-2 capsules (20-40 mg total) by mouth daily.  . polyethylene glycol powder (GLYCOLAX/MIRALAX) 17 GM/SCOOP powder Take 17 g by mouth daily as needed for mild constipation.  . Probiotic Product (PROBIOTIC ADVANCED PO) Take by mouth daily.  . traZODone (DESYREL) 100 MG tablet Take 1 tablet (100 mg total) by mouth at bedtime.  . [DISCONTINUED] cetirizine (ZYRTEC) 10 MG tablet Take 10 mg by mouth daily.     No facility-administered encounter medications on file as of  06/01/2020.    Allergies (verified) Erythromycin, Sulfa antibiotics, and Sulfonamide derivatives   History: Past Medical History:  Diagnosis Date  . Arthritis   . Cataracts, bilateral 06/14/2018  . GERD (gastroesophageal reflux disease)   . Hyperlipidemia   . 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 Meniere disease   . Menopause   . Nuclear sclerosis 06/14/2018   History reviewed. No pertinent surgical history. Family History  Problem Relation  Age of Onset  . Cancer Father        melanoma  . Heart disease Father        Died at 59  . Diverticulosis Father   . COPD Mother   . Osteoporosis Mother   . Hiatal hernia Mother   . Stroke Paternal Grandmother   . Heart disease Other        died 58, around age 78-CABG, smoker  . GER disease Son   . Mental retardation Son   . Heart disease Son    Social History   Socioeconomic History  . Marital status: Married    Spouse name: Not on file  . Number of children: Not on file  . Years of education: Not on file  . Highest education level: Not on file  Occupational History  . Not on file  Tobacco Use  . Smoking status: Never Smoker  . Smokeless tobacco: Never Used  Vaping Use  . Vaping Use: Never used  Substance and Sexual Activity  . Alcohol use: No    Alcohol/week: 0.0 standard drinks  . Drug use: No  . Sexual activity: Yes  Other Topics Concern  . Not on file  Social History Narrative   Married. 2 children (son is special needs-in group home living). 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 Difficulty of Paying Living Expenses: Not hard at all  Food Insecurity: No Food Insecurity  . Worried About Charity fundraiser in the Last Year: Never true  . Ran Out of Food in the Last Year: Never true  Transportation Needs: No Transportation Needs  . Lack of Transportation (Medical): No  . Lack of Transportation (Non-Medical): No  Physical Activity: Inactive  . Days of Exercise per Week: 0 days  . Minutes of Exercise per Session: 0 min  Stress: Stress Concern Present  . Feeling of Stress : To some extent  Social Connections: Moderately Integrated  . Frequency of Communication with Friends and Family: More than three times a week  . Frequency of Social Gatherings with Friends and Family: Three times a week  .  Attends Religious Services: More than 4 times per year  . Active Member of Clubs or Organizations: No  . Attends Archivist Meetings: Never  . Marital Status: Married    Tobacco Counseling Counseling given: Not Answered   Clinical Intake:  Pre-visit preparation completed: Yes  Pain : 0-10 Pain Score: 2  Pain Type: Acute pain Pain Location: Back Pain Orientation: Lower Pain Descriptors / Indicators: Aching Pain Onset: 1 to 4 weeks ago Pain Frequency: Intermittent     BMI - recorded: 29.74 Nutritional Status: BMI 25 -29 Overweight Nutritional Risks: None Diabetes: No  How often do you need to have someone help you when you read instructions, pamphlets, or other written materials from your doctor or pharmacy?: 1 -  Never  Diabetic?No  Interpreter Needed?: No  Information entered by :: Charlott Rakes, LPN   Activities of Daily Living In your present state of health, do you have any difficulty performing the following activities: 06/01/2020  Hearing? Y  Comment right ear loss  Vision? N  Difficulty concentrating or making decisions? N  Walking or climbing stairs? N  Dressing or bathing? N  Doing errands, shopping? N  Preparing Food and eating ? N  Using the Toilet? N  In the past six months, have you accidently leaked urine? Y  Comment urgency at tmes and wears a pad  Do you have problems with loss of bowel control? N  Managing your Medications? N  Managing your Finances? N  Housekeeping or managing your Housekeeping? N  Some recent data might be hidden    Patient Care Team: Marin Olp, MD as PCP - General (Family Medicine) Dian Queen, MD as Consulting Physician (Obstetrics and Gynecology) Erlene Quan [place] Calix, MD as Referring Physician (Internal Medicine) Warren Danes, PA-C as Physician Assistant (Dermatology) Hortencia Pilar, MD as Consulting Physician (Surgery) Hortencia Pilar, MD as Consulting Physician  (Optometry)  Indicate any recent Medical Services you may have received from other than [facility] providers in the past year (date may be approximate).     Assessment:   This is a routine wellness examination for [facility].  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 Patient Stated       Lose weight       Depression Screen PHQ 2/9 Scores 06/01/2020 10/31/2019 05/31/2019 05/24/2019 07/27/2018 01/19/2018 01/19/2018  PHQ - 2 Score 0 0 0 0 1 0 0  PHQ- 9 Score - 4 2 - 2 0 -    Fall Risk Fall Risk  06/01/2020 05/24/2019 08/07/2018 08/01/2017 07/18/2016  Falls in the past year? 0 0 0 No No  Number falls in past yr: 0 0 - - -  Injury with Fall? 0 0 - - -  Risk for fall due to : Impaired vision - - - -  Follow up Falls prevention discussed Falls evaluation completed;Education provided;Falls prevention discussed - - -    FALL RISK PREVENTION PERTAINING TO THE HOME:  Any stairs in or around the home? Yes  If so, are there any without handrails? No  Home free of loose throw rugs in walkways, pet beds, electrical cords, etc? Yes  Adequate lighting in your home to reduce risk of falls? Yes   ASSISTIVE DEVICES UTILIZED TO PREVENT FALLS:  Life alert? No  Use of a cane, walker or w/c? No  Grab bars in the bathroom? No  Shower chair or bench in shower? Yes  Elevated toilet seat or a handicapped toilet? Yes   TIMED UP AND GO:  Was the test performed? No     Cognitive Function:     6CIT Screen 06/01/2020 05/24/2019 08/01/2017  What Year? 0  points 0 points 0  points  What month? 0 points 0 points 0 points  What time? - 0 points 0 points  Count back from 20 0 points 0 points 0 points  Months in reverse 0 points 0 points 0 points  Repeat phrase 2 points 0 points -  Total Score - 0 -    Immunizations Immunization History  Administered Date(s) Administered  . Fluad Quad(high Dose 65+) 09/13/2018, 10/28/2019  . Influenza Split 11/26/2010, 12/12/2011  . Influenza Whole 10/18/2006, 11/10/2008, 11/24/2009  . Influenza, High Dose Seasonal PF 11/27/2015  . Influenza,inj,Quad PF,6+ Mos 12/12/2012, 10/29/2013, 12/03/2014  . Influenza-Unspecified 10/22/2016, 09/18/2017  . PFIZER(Purple Top)SARS-COV-2 Vaccination 03/02/2019, 03/24/2019, 10/28/2019  . Pneumococcal Conjugate-13 12/03/2014  . Pneumococcal Polysaccharide-23 12/12/2011, 01/06/2017  . Td 01/18/2000  . Tdap 11/26/2010  . Zoster 04/09/2012  . Zoster Recombinat (Shingrix) 09/10/2017, 11/10/2017    TDAP status: Up to date  Flu Vaccine status: Up to date  Pneumococcal vaccine status: Up to date  Covid-19 vaccine status: Completed vaccines  Qualifies for Shingles Vaccine? Yes   Zostavax completed Yes   Shingrix Completed?: Yes  Screening Tests Health Maintenance  Topic Date Due  . COVID-19 Vaccine (4 - Booster for [place] series) 01/28/2020  . INFLUENZA VACCINE  08/17/2020  . TETANUS/TDAP  11/25/2020  . MAMMOGRAM  05/13/2021  . COLONOSCOPY (Pts 45-74yrs Insurance coverage will need to be confirmed)  03/14/2022  . DEXA SCAN  Completed  . Hepatitis C Screening  Completed  . PNA vac Low Risk Adult  Completed  . HPV VACCINES  Aged Out    Health Maintenance  Health Maintenance Due  Topic Date Due  . COVID-19 Vaccine (4 - Booster for Pfizer series) 01/28/2020    Colorectal cancer screening: Type of screening: Colonoscopy. Completed 03/14/17. Repeat every 5 years  Mammogram completed 2022 along with pap smear   Bone Density status: Completed 01/18/17. Results reflect: Bone  density results: OSTEOPENIA. Repeat every 0 years.  Additional Screening:  Hepatitis C Screening:  Completed 01/19/16  Vision Screening: Recommended annual ophthalmology exams for early detection of glaucoma and other disorders of the eye. Is the patient up to date with their annual eye exam?  Yes  Who is the provider or what is the name of the office in which the patient attends annual eye exams? Dr Kathlen Mody If pt is not established with a provider, would they like to be referred to a provider to establish care? No .   Dental Screening: Recommended annual dental exams for proper oral hygiene  Community Resource Referral / Chronic Care Management: CRR required this visit?  No   CCM required this visit?  No      Plan:     I have personally reviewed and noted the following in the patient's chart:   . Medical and social history . Use of alcohol, tobacco or illicit drugs  . Current medications and supplements including opioid prescriptions.  . Functional ability and status . Nutritional status . Physical activity . Advanced directives . List of other physicians . Hospitalizations, surgeries, and ER visits in previous 12 months . Vitals . Screenings to include cognitive, depression, and falls . Referrals and appointments  In addition, I have reviewed and discussed with patient certain preventive protocols, quality metrics, and best practice recommendations. A written personalized care plan for preventive services as well as general preventive health recommendations were provided to patient.     Willette Brace, LPN   [id number]   Nurse Notes:  None

## 2020-06-01 NOTE — Patient Instructions (Addendum)
Kristin Bonilla , Thank you for taking time to come for your Medicare Wellness Visit. I appreciate your ongoing commitment to your health goals. Please review the following plan we discussed and let me know if I can assist you in the future.   Screening recommendations/referrals: Colonoscopy: Done 02/22/17 Mammogram: Done 05/08/19 Bone Density: Done 01/18/17 Recommended yearly ophthalmology/optometry visit for glaucoma screening and checkup Recommended yearly dental visit for hygiene and checkup  Vaccinations: Influenza vaccine: Up to date Pneumococcal vaccine: Up to date Tdap vaccine: Up to date Shingles vaccine: Completed 8/25 & 11/10/17   Covid-19:Completed 2/13, 3/7, & 10/28/19  Advanced directives: Please bring a copy of your health care power of attorney and living will to the office at your convenience.  Conditions/risks identified: Lose weight   Next appointment: Follow up in one year for your annual wellness visit    Preventive Care 65 Years and Older, Female Preventive care refers to lifestyle choices and visits with your health care provider that can promote health and wellness. What does preventive care include?  A yearly physical exam. This is also called an annual well check.  Dental exams once or twice a year.  Routine eye exams. Ask your health care provider how often you should have your eyes checked.  Personal lifestyle choices, including:  Daily care of your teeth and gums.  Regular physical activity.  Eating a healthy diet.  Avoiding tobacco and drug use.  Limiting alcohol use.  Practicing safe sex.  Taking low-dose aspirin every day.  Taking vitamin and mineral supplements as recommended by your health care provider. What happens during an annual well check? The services and screenings done by your health care provider during your annual well check will depend on your age, overall health, lifestyle risk factors, and family history of disease. Counseling   Your health care provider may ask you questions about your:  Alcohol use.  Tobacco use.  Drug use.  Emotional well-being.  Home and relationship well-being.  Sexual activity.  Eating habits.  History of falls.  Memory and ability to understand (cognition).  Work and work Statistician.  Reproductive health. Screening  You may have the following tests or measurements:  Height, weight, and BMI.  Blood pressure.  Lipid and cholesterol levels. These may be checked every 5 years, or more frequently if you are over 83 years old.  Skin check.  Lung cancer screening. You may have this screening every year starting at age 36 if you have a 30-pack-year history of smoking and currently smoke or have quit within the past 15 years.  Fecal occult blood test (FOBT) of the stool. You may have this test every year starting at age 48.  Flexible sigmoidoscopy or colonoscopy. You may have a sigmoidoscopy every 5 years or a colonoscopy every 10 years starting at age 51.  Hepatitis C blood test.  Hepatitis B blood test.  Sexually transmitted disease (STD) testing.  Diabetes screening. This is done by checking your blood sugar (glucose) after you have not eaten for a while (fasting). You may have this done every 1-3 years.  Bone density scan. This is done to screen for osteoporosis. You may have this done starting at age 69.  Mammogram. This may be done every 1-2 years. Talk to your health care provider about how often you should have regular mammograms. Talk with your health care provider about your test results, treatment options, and if necessary, the need for more tests. Vaccines  Your health care provider may  recommend certain vaccines, such as:  Influenza vaccine. This is recommended every year.  Tetanus, diphtheria, and acellular pertussis (Tdap, Td) vaccine. You may need a Td booster every 10 years.  Zoster vaccine. You may need this after age 13.  Pneumococcal 13-valent  conjugate (PCV13) vaccine. One dose is recommended after age 47.  Pneumococcal polysaccharide (PPSV23) vaccine. One dose is recommended after age 22. Talk to your health care provider about which screenings and vaccines you need and how often you need them. This information is not intended to replace advice given to you by your health care provider. Make sure you discuss any questions you have with your health care provider. Document Released: 01/30/2015 Document Revised: 09/23/2015 Document Reviewed: 11/04/2014 Elsevier Interactive Patient Education  2017 [place] Prevention in the Home Falls can cause injuries. They can happen to people of all ages. There are many things you can do to make your home safe and to help prevent falls. What can I do on the outside of my home?  Regularly fix the edges of walkways and driveways and fix any cracks.  Remove anything that might make you trip as you walk through a door, such as a raised step or threshold.  Trim any bushes or trees on the path to your home.  Use bright outdoor lighting.  Clear any walking paths of anything that might make someone trip, such as rocks or tools.  Regularly check to see if handrails are loose or broken. Make sure that both sides of any steps have handrails.  Any raised decks and porches should have guardrails on the edges.  Have any leaves, snow, or ice cleared regularly.  Use sand or salt on walking paths during winter.  Clean up any spills in your garage right away. This includes oil or grease spills. What can I do in the bathroom?  Use night lights.  Install grab bars by the toilet and in the tub and shower. Do not use towel bars as grab bars.  Use non-skid mats or decals in the tub or shower.  If you need to sit down in the shower, use a plastic, non-slip stool.  Keep the floor dry. Clean up any water that spills on the floor as soon as it happens.  Remove soap buildup in the tub or  shower regularly.  Attach bath mats securely with double-sided non-slip rug tape.  Do not have throw rugs and other things on the floor that can make you trip. What can I do in the bedroom?  Use night lights.  Make sure that you have a light by your bed that is easy to reach.  Do not use any sheets or blankets that are too big for your bed. They should not hang down onto the floor.  Have a firm chair that has side arms. You can use this for support while you get dressed.  Do not have throw rugs and other things on the floor that can make you trip. What can I do in the kitchen?  Clean up any spills right away.  Avoid walking on wet floors.  Keep items that you use a lot in easy-to-reach places.  If you need to reach something above you, use a strong step stool that has a grab bar.  Keep electrical cords out of the way.  Do not use floor polish or wax that makes floors slippery. If you must use wax, use non-skid floor wax.  Do not have throw rugs and  other things on the floor that can make you trip. What can I do with my stairs?  Do not leave any items on the stairs.  Make sure that there are handrails on both sides of the stairs and use them. Fix handrails that are broken or loose. Make sure that handrails are as long as the stairways.  Check any carpeting to make sure that it is firmly attached to the stairs. Fix any carpet that is loose or worn.  Avoid having throw rugs at the top or bottom of the stairs. If you do have throw rugs, attach them to the floor with carpet tape.  Make sure that you have a light switch at the top of the stairs and the bottom of the stairs. If you do not have them, ask someone to add them for you. What else can I do to help prevent falls?  Wear shoes that:  Do not have high heels.  Have rubber bottoms.  Are comfortable and fit you well.  Are closed at the toe. Do not wear sandals.  If you use a stepladder:  Make sure that it is fully  opened. Do not climb a closed stepladder.  Make sure that both sides of the stepladder are locked into place.  Ask someone to hold it for you, if possible.  Clearly mark and make sure that you can see:  Any grab bars or handrails.  First and last steps.  Where the edge of each step is.  Use tools that help you move around (mobility aids) if they are needed. These include:  Canes.  Walkers.  Scooters.  Crutches.  Turn on the lights when you go into a dark area. Replace any light bulbs as soon as they burn out.  Set up your furniture so you have a clear path. Avoid moving your furniture around.  If any of your floors are uneven, fix them.  If there are any pets around you, be aware of where they are.  Review your medicines with your doctor. Some medicines can make you feel dizzy. This can increase your chance of falling. Ask your doctor what other things that you can do to help prevent falls. This information is not intended to replace advice given to you by your health care provider. Make sure you discuss any questions you have with your health care provider. Document Released: 10/30/2008 Document Revised: 06/11/2015 Document Reviewed: 02/07/2014 Elsevier Interactive Patient Education  2017 [employer].

## 2020-06-03 ENCOUNTER — Encounter: Payer: Medicare PPO | Admitting: Family Medicine

## 2020-06-11 DIAGNOSIS — M8588 Other specified disorders of bone density and structure, other site: Secondary | ICD-10-CM | POA: Diagnosis not present

## 2020-06-11 DIAGNOSIS — N958 Other specified menopausal and perimenopausal disorders: Secondary | ICD-10-CM | POA: Diagnosis not present

## 2020-07-01 ENCOUNTER — Telehealth: Payer: Self-pay

## 2020-07-01 ENCOUNTER — Encounter: Payer: Self-pay | Admitting: Family Medicine

## 2020-07-01 DIAGNOSIS — H2513 Age-related nuclear cataract, bilateral: Secondary | ICD-10-CM | POA: Diagnosis not present

## 2020-07-01 DIAGNOSIS — H3509 Other intraretinal microvascular abnormalities: Secondary | ICD-10-CM | POA: Diagnosis not present

## 2020-07-01 DIAGNOSIS — H25013 Cortical age-related cataract, bilateral: Secondary | ICD-10-CM | POA: Diagnosis not present

## 2020-07-01 DIAGNOSIS — H35362 Drusen (degenerative) of macula, left eye: Secondary | ICD-10-CM | POA: Diagnosis not present

## 2020-07-01 NOTE — Telephone Encounter (Signed)
Patient called in and stated she needed all her medications sent in and would like them sent to  [place], [address] Phone:  [phone]  Fax:  [phone]

## 2020-07-24 ENCOUNTER — Encounter: Payer: Self-pay | Admitting: Family Medicine

## 2020-07-24 ENCOUNTER — Telehealth: Payer: Self-pay

## 2020-07-24 ENCOUNTER — Other Ambulatory Visit: Payer: Self-pay

## 2020-07-24 ENCOUNTER — Ambulatory Visit (INDEPENDENT_AMBULATORY_CARE_PROVIDER_SITE_OTHER): Payer: Medicare PPO | Admitting: Family Medicine

## 2020-07-24 VITALS — BP 105/66 | HR 76 | Temp 97.2°F | Ht 62.0 in | Wt 157.8 lb

## 2020-07-24 DIAGNOSIS — H8101 Meniere's disease, right ear: Secondary | ICD-10-CM | POA: Diagnosis not present

## 2020-07-24 DIAGNOSIS — E785 Hyperlipidemia, unspecified: Secondary | ICD-10-CM | POA: Diagnosis not present

## 2020-07-24 DIAGNOSIS — F325 Major depressive disorder, single episode, in full remission: Secondary | ICD-10-CM | POA: Diagnosis not present

## 2020-07-24 DIAGNOSIS — M85851 Other specified disorders of bone density and structure, right thigh: Secondary | ICD-10-CM

## 2020-07-24 DIAGNOSIS — D72819 Decreased white blood cell count, unspecified: Secondary | ICD-10-CM

## 2020-07-24 DIAGNOSIS — R3129 Other microscopic hematuria: Secondary | ICD-10-CM

## 2020-07-24 DIAGNOSIS — Z79899 Other long term (current) drug therapy: Secondary | ICD-10-CM

## 2020-07-24 DIAGNOSIS — N183 Chronic kidney disease, stage 3 unspecified: Secondary | ICD-10-CM

## 2020-07-24 DIAGNOSIS — Z0000 Encounter for general adult medical examination without abnormal findings: Secondary | ICD-10-CM

## 2020-07-24 DIAGNOSIS — G4700 Insomnia, unspecified: Secondary | ICD-10-CM | POA: Diagnosis not present

## 2020-07-24 LAB — CBC WITH DIFFERENTIAL/PLATELET
Basophils Absolute: 0.1 10*3/uL (ref 0.0–0.1)
Basophils Relative: 2.4 % (ref 0.0–3.0)
Eosinophils Absolute: 0.1 10*3/uL (ref 0.0–0.7)
Eosinophils Relative: 3.8 % (ref 0.0–5.0)
HCT: 40.1 % (ref 36.0–46.0)
Hemoglobin: 13.6 g/dL (ref 12.0–15.0)
Lymphocytes Relative: 43.4 % (ref 12.0–46.0)
Lymphs Abs: 1.6 10*3/uL (ref 0.7–4.0)
MCHC: 33.9 g/dL (ref 30.0–36.0)
MCV: 89.5 fl (ref 78.0–100.0)
Monocytes Absolute: 0.3 10*3/uL (ref 0.1–1.0)
Monocytes Relative: 8.9 % (ref 3.0–12.0)
Neutro Abs: 1.5 10*3/uL (ref 1.4–7.7)
Neutrophils Relative %: 41.5 % — ABNORMAL LOW (ref 43.0–77.0)
Platelets: 277 10*3/uL (ref 150.0–400.0)
RBC: 4.48 Mil/uL (ref 3.87–5.11)
RDW: 13.6 % (ref 11.5–15.5)
WBC: 3.7 10*3/uL — ABNORMAL LOW (ref 4.0–10.5)

## 2020-07-24 LAB — COMPREHENSIVE METABOLIC PANEL
ALT: 18 U/L (ref 0–35)
AST: 26 U/L (ref 0–37)
Albumin: 4.6 g/dL (ref 3.5–5.2)
Alkaline Phosphatase: 59 U/L (ref 39–117)
BUN: 16 mg/dL (ref 6–23)
CO2: 27 mEq/L (ref 19–32)
Calcium: 10 mg/dL (ref 8.4–10.5)
Chloride: 103 mEq/L (ref 96–112)
Creatinine, Ser: 0.95 mg/dL (ref 0.40–1.20)
GFR: 60.47 mL/min (ref >60.00)
Glucose, Bld: 88 mg/dL (ref 70–99)
Potassium: 3.6 mEq/L (ref 3.5–5.1)
Sodium: 140 mEq/L (ref 135–145)
Total Bilirubin: 0.5 mg/dL (ref 0.2–1.2)
Total Protein: 7.1 g/dL (ref 6.0–8.3)

## 2020-07-24 LAB — VITAMIN B12: Vitamin B-12: 394 pg/mL (ref 211–911)

## 2020-07-24 LAB — URINALYSIS, MICROSCOPIC ONLY

## 2020-07-24 LAB — LIPID PANEL
Cholesterol: 197 mg/dL (ref 0–200)
HDL: 74 mg/dL (ref >39.00)
LDL Cholesterol: 105 mg/dL — ABNORMAL HIGH (ref 0–99)
NonHDL: 122.83
Total CHOL/HDL Ratio: 3
Triglycerides: 90 mg/dL (ref 0.0–149.0)
VLDL: 18 mg/dL (ref 0.0–40.0)

## 2020-07-24 MED ORDER — LEVOCETIRIZINE DIHYDROCHLORIDE 5 MG PO TABS: 5.0000 mg | ORAL_TABLET | Freq: Every evening | ORAL | 3 refills | Status: DC

## 2020-07-24 MED ORDER — DESVENLAFAXINE SUCCINATE ER 50 MG PO TB24: 50.0000 mg | ORAL_TABLET | Freq: Every day | ORAL | 3 refills | Status: DC

## 2020-07-24 MED ORDER — TRAZODONE HCL 100 MG PO TABS: 100.0000 mg | ORAL_TABLET | Freq: Every day | ORAL | 3 refills | Status: DC

## 2020-07-24 MED ORDER — MONTELUKAST SODIUM 10 MG PO TABS: 10.0000 mg | ORAL_TABLET | Freq: Every day | ORAL | 3 refills | Status: DC

## 2020-07-24 MED ORDER — FENOFIBRATE 160 MG PO TABS: 160.0000 mg | ORAL_TABLET | Freq: Every day | ORAL | 3 refills | Status: DC

## 2020-07-24 NOTE — Telephone Encounter (Signed)
Refills sent to [facility].

## 2020-07-24 NOTE — Telephone Encounter (Signed)
  LAST APPOINTMENT DATE: 07/24/2020   NEXT APPOINTMENT DATE:01/26/20  MEDICATION:desvenlafaxine (PRISTIQ) 50 MG 24 hr tablet//fenofibrate 160 MG tablet//  levocetirizine (XYZAL) 5 MG tablet//  montelukast (SINGULAIR) 10 MG tablet//  traZODone (DESYREL) 100 MG tablet//  omeprazole (PRILOSEC) 20 MG capsule  PHARMACY: [facility] mail order

## 2020-07-24 NOTE — Patient Instructions (Addendum)
Please stop by lab before you go If you have mychart- we will send your results within 3 business days of [place] receiving them.  If you do not have mychart- we will call you about results within 5 business days of [place] receiving them.  *please also note that you will see labs on mychart as soon as they post. I will later go in and write notes on them- will say "notes from Dr. Yong Channel"  We will call you within two weeks about your referral to coronary calcium scoring. If you do not hear within 2 weeks, give [place] a call.   Recommended follow up: Return in about 6 months (around 01/24/2021) for follow up- or sooner if needed.

## 2020-07-24 NOTE — Progress Notes (Signed)
Phone [phone]   Subjective:  Patient presents today for their annual physical. Chief complaint-noted.   See problem oriented charting- ROS- full  review of systems was completed and negative except for: ear discomfort- possibly from the neck, neck tightness and stiffness, shoulder pain, sinus pressure, post nasal drip and allergies  The following were reviewed and entered/updated in epic: Past Medical History:  Diagnosis Date   Arthritis    Cataracts, bilateral 06/14/2018   GERD (gastroesophageal reflux disease)    Hyperlipidemia    Internal thrombosed hemorrhoids 01/09/2008   Meniere disease    Menopause    Nuclear sclerosis 06/14/2018   Patient Active Problem List   Diagnosis Date Noted   Leukopenia 05/29/2014    Priority: Medium   CKD (chronic kidney disease), stage III ([place]) 12/20/2013    Priority: Medium   Meniere disease 12/20/2013    Priority: Medium   Major depression in full remission ([place]) 06/07/2007    Priority: Medium   Hyperlipidemia 10/25/2006    Priority: Medium   Allergic rhinitis 07/18/2017    Priority: Low   Osteopenia 12/03/2014    Priority: Low   Insomnia 05/29/2014    Priority: Low   Alopecia 04/27/2009    Priority: Low   GERD (gastroesophageal reflux disease) 01/09/2008    Priority: Low   Irritable bowel syndrome 01/09/2008    Priority: Low   Hematuria, microscopic 10/08/2018   Cataracts, bilateral 06/14/2018   Nuclear sclerosis 06/14/2018   History reviewed. No pertinent surgical history.  Family History  Problem Relation Age of Onset   Cancer Father        melanoma   Heart disease Father        Died at 110   Diverticulosis Father    COPD Mother    Osteoporosis Mother    Hiatal hernia Mother    Stroke Paternal Grandmother    Heart disease Other        died 64, around age 4-CABG, smoker   GER disease Son    Mental retardation Son    Heart disease Son     Medications- reviewed and updated Current Outpatient Medications   Medication Sig Dispense Refill   acetaminophen (TYLENOL) 500 MG tablet Take 1,000 mg by mouth daily as needed for mild pain or headache.     aspirin EC 81 MG tablet Take 81 mg by mouth daily.     Biotin 5000 MCG CAPS Take 5,000 mcg by mouth daily.     Calcium Carbonate-Vit D-Min (CALCIUM 1200 PO) Take 1 tablet by mouth daily.     desvenlafaxine (PRISTIQ) 50 MG 24 hr tablet Take 1 tablet (50 mg total) by mouth daily. 90 tablet 3   fenofibrate 160 MG tablet Take 1 tablet (160 mg total) by mouth daily. 90 tablet 3   levocetirizine (XYZAL) 5 MG tablet Take 1 tablet (5 mg total) by mouth every evening. 90 tablet 3   Melatonin 5 MG TABS Take 1 tablet by mouth at bedtime.     montelukast (SINGULAIR) 10 MG tablet Take 1 tablet (10 mg total) by mouth at bedtime. 90 tablet 3   Multiple Vitamin (MULTIVITAMIN) tablet Take 1 tablet by mouth daily.     Omega-3 Fatty Acids (FISH OIL BURP-LESS PO) Take 1 capsule by mouth daily.     omeprazole (PRILOSEC) 20 MG capsule Take 1-2 capsules (20-40 mg total) by mouth daily. 180 capsule 3   polyethylene glycol powder (GLYCOLAX/MIRALAX) 17 GM/SCOOP powder Take 17 g by mouth daily as  needed for mild constipation.     Probiotic Product (PROBIOTIC ADVANCED PO) Take by mouth daily.     traZODone (DESYREL) 100 MG tablet Take 1 tablet (100 mg total) by mouth at bedtime. 90 tablet 3   No current facility-administered medications for this visit.    Allergies-reviewed and updated Allergies  Allergen Reactions   Erythromycin Swelling   Sulfa Antibiotics    Sulfonamide Derivatives Nausea Only    Social History   Social History Narrative   Married. 2 children (son is special needs-in group home living). 1 grandchild      Retired from [employer] different grades      Hobbies: reading, ipad, former bowling a lot   Objective  Objective:  BP 105/66   Pulse 76   Temp (!) 97.2 F (36.2 C) (Temporal)   Ht 5\' 2"  (1.575 m)   Wt 157 lb 12.8 oz (71.6  kg)   SpO2 97%   BMI 28.86 kg/m  Gen: NAD, resting comfortably HEENT: Mucous membranes are moist. Oropharynx normal Neck: no thyromegaly CV: RRR no murmurs rubs or gallops Lungs: CTAB no crackles, wheeze, rhonchi Abdomen: soft/nontender/nondistended/normal bowel sounds. No rebound or guarding.  Ext: no edema Skin: warm, dry Neuro: grossly normal, moves all extremities, PERRLA   Assessment and Plan   70 y.o. female presenting for annual physical.  Health Maintenance counseling: 1. Anticipatory guidance: Patient counseled regarding regular dental exams -q6 months, eye exams - yearly,  avoiding smoking and second hand smoke , limiting alcohol to 1 beverage per day- doesn't drink .   2. Risk factor reduction:  Advised patient of need for regular exercise and diet rich and fruits and vegetables to reduce risk of heart attack and stroke. Exercise-  intermittently walking but does get at least 5k to 10k steps a day. Diet-  Patient down 5 pounds from last visit-congratulated on efforts- trying to watch portion sizes but admits enjoys eating and doesn't always choose best  Wt Readings from Last 3 Encounters:  07/24/20 157 lb 12.8 oz (71.6 kg)  10/31/19 162 lb 9.6 oz (73.8 kg)  05/31/19 155 lb 8 oz (70.5 kg)  3. Immunizations/screenings/ancillary studies-fully up-to-date other than COVID-19 vaccination #4- wants to hold off Immunization History  Administered Date(s) Administered   Fluad Quad(high Dose 65+) 09/13/2018, 10/28/2019   Influenza Split 11/26/2010, 12/12/2011   Influenza Whole 10/18/2006, 11/10/2008, 11/24/2009   Influenza, High Dose Seasonal PF 11/27/2015   Influenza,inj,Quad PF,6+ Mos 12/12/2012, 10/29/2013, 12/03/2014   Influenza-Unspecified 10/22/2016, 09/18/2017   PFIZER(Purple Top)SARS-COV-2 Vaccination 03/02/2019, 03/24/2019, 10/28/2019   Pneumococcal Conjugate-13 12/03/2014   Pneumococcal Polysaccharide-23 12/12/2011, 01/06/2017   Td 01/18/2000   Tdap 11/26/2010   Zoster  Recombinat (Shingrix) 09/10/2017, 11/10/2017   Zoster, Live 04/09/2012   4. Cervical cancer screening- still sees gynecology.  Technically past age based screening recommendations 5. Breast cancer screening-  breast exam with gynecology and mammogram - 05/13/20- try to get records 6. Colon cancer screening - February 2019 with 5-year repeat planned in 2024 7. Skin cancer screening-Dr. Channing Mutters yearly with dermatology. advised regular sunscreen use. Denies worrisome, changing, or new skin lesions.  8. Birth control/STD check-postmenopausal and monogamous 9. Osteoporosis screening at 65-see discussion below  -Never smoker  Status of chronic or acute concerns   #hyperlipidemia S: Medication: Fenofibrate 160 mg and fish oil -10-year ASCVD risk has been above 7.5% Lab Results  Component Value Date   CHOL 202 (H) 05/31/2019   HDL 73.10 05/31/2019   LDLCALC 111 (H)  05/31/2019   LDLDIRECT 99 10/31/2019   TRIG 88.0 05/31/2019   CHOLHDL 3 05/31/2019   A/P: We discussed several options today for mildly poorly controlled cholesterol-first we will update her lipids.  We discussed possibly starting statin versus getting more information with coronary artery calcium scoring versus continue to work on lifestyle changes-ultimately we decided on coronary artery calcium scoring-referral was placed today.  #Chronic kidney disease stage III S: GFR is typically in the 50s range but has been higher at times -Patient knows to avoid NSAIDs  A/P: hopefully stable- update cmp today.  # GERD S:Medication: prilosec 20 mg- rarely has to use 2nd. Failed pepcid in past.  A/P: Stable. Continue current medications.  Failed pepcid in past and sometimes has breakthrough so do not think we should retry right now   # Depression #Insomnia S: Medication: Pristiq 50 mg for depression, trazodone and melatonin now down from 10 to 2.5 mg combination for sleep in the past and have discussed possible Belsomra as well as done  sleep hygiene counseling.   Goes to bed at 11 it will be 1 to fall asleep. If she gets sleepy after 8 will go to bed and is able to fall asleep with trazodone  A/P: depression controlled with phq9 under  5. Still with some sleep issues but I like her strategy of laying down as soon as she feels tired after 8 and has had some success with this.   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  Has been on Maxizde in the past but ENT later took her off medication.  She no longer sees ENT A/P: stable recently- if worsens could refer back to ENT    # Low Bone density (formerly osteopenia) S: Last DEXA: 06/11/20 only osteopenia on lumbar spine with GYN- unfortunately   Calcium: 1200mg  (through diet ok) recommended - GYN recommended 600 total which is reasonable as long as gets through diet Vitamin D: 1000 units a day recommended -Weightbearing exercise encouraged  A/P: stable recently- continue gyn follow up as well   #Isolated leukopenia-intermittently-we will trend CBC and if worsening or other cell lines affected consider further work-up.  Has seen Dr. Irene Limbo in the past in 2018-he thought PPI could be contributing-LDH, B12, TSH, folate, hepatitis C were normal  #History hematuria-prior evaluation by Dr. Louis Meckel for hematuria-cystoscopy and CT in the past.  We will get a urine with labs annually   Recommended follow up: Return in about 6 months (around 01/24/2021) for follow up- or sooner if needed. Future Appointments  Date Time Provider [place]  03/31/2021  1:30 PM Warren Danes, PA-C [facility]  06/07/2021 11:45 AM [facility]   Lab/Order associations: fasting   ICD-10-CM   1. Preventative health care  Z00.00     2. Major depressive disorder with single episode, in full remission ([place]) Chronic F32.5     3. Hyperlipidemia, unspecified hyperlipidemia type  E78.5 CBC with Differential/Platelet     Comprehensive metabolic panel    Lipid panel    CT CARDIAC SCORING (SELF PAY ONLY)    4. Stage 3 chronic kidney disease, unspecified whether stage 3a or 3b CKD ([facility])  N18.30     5. Leukopenia, unspecified type  D72.819     6. Meniere's disease of right ear  H81.01     7. Osteopenia of neck of right femur  M85.851     8. Insomnia, unspecified type  G47.00     9. Hematuria, microscopic  R31.29 Urine Microscopic    10. High risk medication use  Z79.899 B12      No orders of the defined types were placed in this encounter.   Return precautions advised.  Garret Reddish, MD

## 2020-07-30 ENCOUNTER — Encounter: Payer: Self-pay | Admitting: Family Medicine

## 2020-08-21 ENCOUNTER — Ambulatory Visit (INDEPENDENT_AMBULATORY_CARE_PROVIDER_SITE_OTHER)
Admission: RE | Admit: 2020-08-21 | Discharge: 2020-08-21 | Disposition: A | Discharge status: Home / Self Care | Payer: Self-pay | Source: Ambulatory Visit | Attending: Family Medicine | Admitting: Family Medicine

## 2020-08-21 ENCOUNTER — Other Ambulatory Visit: Payer: Self-pay

## 2020-08-21 DIAGNOSIS — E785 Hyperlipidemia, unspecified: Secondary | ICD-10-CM

## 2020-08-22 ENCOUNTER — Encounter: Payer: Self-pay | Admitting: Family Medicine

## 2020-08-22 DIAGNOSIS — I700 Atherosclerosis of aorta: Secondary | ICD-10-CM | POA: Insufficient documentation

## 2020-08-25 NOTE — Telephone Encounter (Signed)
Patient would like medication for Cholesterol as previously discussed sent to [facility] ([facility]) - [place], [place]

## 2020-08-26 ENCOUNTER — Other Ambulatory Visit: Payer: Self-pay

## 2020-08-26 MED ORDER — ROSUVASTATIN CALCIUM 5 MG PO TABS: 5.0000 mg | ORAL_TABLET | Freq: Every day | ORAL | 3 refills | Status: DC

## 2020-11-17 ENCOUNTER — Encounter: Payer: Medicare PPO | Admitting: Family Medicine

## 2020-12-14 ENCOUNTER — Other Ambulatory Visit: Payer: Self-pay

## 2020-12-14 ENCOUNTER — Encounter: Payer: Self-pay | Admitting: Family

## 2020-12-14 ENCOUNTER — Ambulatory Visit: Payer: Medicare PPO | Admitting: Family

## 2020-12-14 VITALS — BP 110/62 | HR 79 | Temp 97.3°F | Ht 62.0 in | Wt 161.8 lb

## 2020-12-14 DIAGNOSIS — J069 Acute upper respiratory infection, unspecified: Secondary | ICD-10-CM | POA: Insufficient documentation

## 2020-12-14 MED ORDER — ALBUTEROL SULFATE HFA 108 (90 BASE) MCG/ACT IN AERS: 2.0000 | INHALATION_SPRAY | Freq: Four times a day (QID) | RESPIRATORY_TRACT | 0 refills | Status: DC | PRN

## 2020-12-14 NOTE — Patient Instructions (Addendum)
It was very nice to see you today!  I have sent an inhaler to your pharmacy, start this today to help with your shortness of breath, chest tightness. If it causes jitteriness, just do 1 puff 2-3 times/day. If it does not seem to help your symptoms at all, then stop using. Continue to drink at least 2 liters of water daily. Also recommend in addition to the generic Mucinex, trying generic Sudafed or Claritin-D as well as a generic saline nasal spray (3 times/day) to help with your sinus symptoms for the next 5 days only as directed on the package. Call back to the office later this week if you feel your symptoms are not improving at all, or are worsening.   PLEASE NOTE:  If you had any lab tests please let us know if you have not heard back within a few days. You may see your results on MyChart before we have a chance to review them but we will give you a call once they are reviewed by [place]. If we ordered any referrals today, please let us know if you have not heard from their office within the next week.   Please try these tips to maintain a healthy lifestyle:  Eat most of your calories during the day when you are active. Eliminate processed foods including packaged sweets (pies, cakes, cookies), reduce intake of potatoes, white bread, white pasta, and white rice. Look for whole grain options, oat flour or almond flour.  Each meal should contain half fruits/vegetables, one quarter protein, and one quarter carbs (no bigger than a computer mouse).  Cut down on sweet beverages. This includes juice, soda, and sweet tea. Also watch fruit intake, though this is a healthier sweet option, it still contains natural sugar! Limit to 3 servings daily.  Drink at least 1 glass of water with each meal and aim for at least 8 glasses per day  Exercise at least 150 minutes every week.

## 2020-12-14 NOTE — Progress Notes (Signed)
Subjective:     Patient ID: Kristin Bonilla, female    DOB: 1951-01-15, 70 y.o.   MRN: [id number]  Chief Complaint  Patient presents with   Nasal Congestion    Symptoms started last Tuesday. She has used Mucinex, and tried nasal spray, but says that it caused swelling in her nose.    Fatigue   Sinusitis   Sore Throat    Started off sore, but is currently mild.   HPI: Upper Respiratory Infection: Symptoms include congestion, low grade fever, nasal congestion, post nasal drip, shortness of breath, sinus pressure, sneezing, and fatigue .  Onset of symptoms was 6 days ago, unchanged since that time. She is drinking moderate amounts of fluids. Evaluation to date: none.  Treatment to date: antihistamines, cough suppressants, and nasal steroids.   Health Maintenance Due  Topic Date Due   INFLUENZA VACCINE  08/17/2020   TETANUS/TDAP  11/25/2020    Past Medical History:  Diagnosis Date   Arthritis    Cataracts, bilateral 06/14/2018   GERD (gastroesophageal reflux disease)    Hyperlipidemia    Internal thrombosed hemorrhoids 01/09/2008   Meniere disease    Menopause    Nuclear sclerosis 06/14/2018    History reviewed. No pertinent surgical history.  Outpatient Medications Prior to Visit  Medication Sig Dispense Refill   acetaminophen (TYLENOL) 500 MG tablet Take 1,000 mg by mouth daily as needed for mild pain or headache.     aspirin EC 81 MG tablet Take 81 mg by mouth daily.     Biotin 5000 MCG CAPS Take 5,000 mcg by mouth daily.     Calcium Carbonate-Vit D-Min (CALCIUM 1200 PO) Take 1 tablet by mouth daily.     desvenlafaxine (PRISTIQ) 50 MG 24 hr tablet Take 1 tablet (50 mg total) by mouth daily. 90 tablet 3   fenofibrate 160 MG tablet Take 1 tablet (160 mg total) by mouth daily. 90 tablet 3   levocetirizine (XYZAL) 5 MG tablet Take 1 tablet (5 mg total) by mouth every evening. 90 tablet 3   Melatonin 5 MG TABS Take 1 tablet by mouth at bedtime.     montelukast (SINGULAIR)  10 MG tablet Take 1 tablet (10 mg total) by mouth at bedtime. 90 tablet 3   Multiple Vitamin (MULTIVITAMIN) tablet Take 1 tablet by mouth daily.     Omega-3 Fatty Acids (FISH OIL BURP-LESS PO) Take 1 capsule by mouth daily.     omeprazole (PRILOSEC) 20 MG capsule Take 1-2 capsules (20-40 mg total) by mouth daily. 180 capsule 3   polyethylene glycol powder (GLYCOLAX/MIRALAX) 17 GM/SCOOP powder Take 17 g by mouth daily as needed for mild constipation.     Probiotic Product (PROBIOTIC ADVANCED PO) Take by mouth daily.     rosuvastatin (CRESTOR) 5 MG tablet Take 1 tablet (5 mg total) by mouth daily. 90 tablet 3   traZODone (DESYREL) 100 MG tablet Take 1 tablet (100 mg total) by mouth at bedtime. 90 tablet 3   No facility-administered medications prior to visit.    Allergies  Allergen Reactions   Erythromycin Swelling   Sulfa Antibiotics    Sulfonamide Derivatives Nausea Only        Objective:    Physical Exam Vitals and nursing note reviewed.  Constitutional:      Appearance: Normal appearance.  HENT:     Right Ear: Tympanic membrane and ear canal normal.     Left Ear: Tympanic membrane and ear canal normal.  Nose:     Right Sinus: No frontal sinus tenderness.     Left Sinus: No frontal sinus tenderness.     Mouth/Throat:     Mouth: Mucous membranes are moist.     Pharynx: No pharyngeal swelling, oropharyngeal exudate or posterior oropharyngeal erythema.  Cardiovascular:     Rate and Rhythm: Normal rate and regular rhythm.  Pulmonary:     Effort: Pulmonary effort is normal.     Breath sounds: Normal breath sounds.  Musculoskeletal:        General: Normal range of motion.  Skin:    General: Skin is warm and dry.  Neurological:     Mental Status: She is alert.  Psychiatric:        Mood and Affect: Mood normal.        Behavior: Behavior normal.    BP 110/62   Pulse 79   Temp (!) 97.3 F (36.3 C) (Temporal)   Ht 5\' 2"  (1.575 m)   Wt 161 lb 12.8 oz (73.4 kg)   SpO2  95%   BMI 29.59 kg/m  Wt Readings from Last 3 Encounters:  12/14/20 161 lb 12.8 oz (73.4 kg)  07/24/20 157 lb 12.8 oz (71.6 kg)  10/31/19 162 lb 9.6 oz (73.8 kg)       Assessment & Plan:   Problem List Items Addressed This Visit       Respiratory   Viral upper respiratory tract infection - Primary    Pt with symptoms 6 days, denies sinus pain, mild pressure, cough has improved, but having a lot of fatigue, shortness of breath, chest tightness. Sending Albuterol inhaler which pt has used in the past for bronchitis. OK to continue generic Mucinex, and recommend generic Sudafed for the next few days, drink plenty of fluids. Call back later in week if not improving.      Relevant Medications   albuterol (VENTOLIN HFA) 108 (90 Base) MCG/ACT inhaler    Meds ordered this encounter  Medications   albuterol (VENTOLIN HFA) 108 (90 Base) MCG/ACT inhaler    Sig: Inhale 2 puffs into the lungs every 6 (six) hours as needed for wheezing or shortness of breath. Inhale 1 puff, hold for a few seconds, wait a moment, then inhale 2nd puff.    Dispense:  8 g    Refill:  0    Order Specific Question:   Supervising Provider    Answer:   ANDY, CAMILLE L [[id number]]

## 2020-12-14 NOTE — Assessment & Plan Note (Signed)
Pt with symptoms 6 days, denies sinus pain, mild pressure, cough has improved, but having a lot of fatigue, shortness of breath, chest tightness. Sending Albuterol inhaler which pt has used in the past for bronchitis. OK to continue generic Mucinex, and recommend generic Sudafed for the next few days, drink plenty of fluids. Call back later in week if not improving.

## 2020-12-17 ENCOUNTER — Telehealth: Payer: Self-pay

## 2020-12-17 NOTE — Telephone Encounter (Signed)
Spoke with pt to address concerns. She complains of extreme fatigue, and chest heaviness with some SOB. She is currently taking Albuterol. She says that she has not improved since Monday. She is also taking Sudafed. But symptoms have not subsided.   Please advise.

## 2020-12-17 NOTE — Telephone Encounter (Signed)
Patient called in and stated she was seen on Monday and was told if she wasn't feeling any better to call back patient states she not any better and would like something sent in. Please advise.

## 2020-12-18 ENCOUNTER — Other Ambulatory Visit: Payer: Self-pay | Admitting: Family

## 2020-12-18 DIAGNOSIS — J069 Acute upper respiratory infection, unspecified: Secondary | ICD-10-CM

## 2020-12-18 MED ORDER — DOXYCYCLINE HYCLATE 100 MG PO TABS: 100.0000 mg | ORAL_TABLET | Freq: Two times a day (BID) | ORAL | 0 refills | Status: DC

## 2020-12-18 MED ORDER — PREDNISONE 20 MG PO TABS: ORAL_TABLET | ORAL | 0 refills | Status: DC

## 2020-12-18 NOTE — Telephone Encounter (Signed)
Pt is aware.  

## 2020-12-18 NOTE — Telephone Encounter (Signed)
I sent an antibiotic, Doxycycline and prednisone to take for 5 days.

## 2020-12-28 ENCOUNTER — Encounter: Payer: Self-pay | Admitting: Family Medicine

## 2021-01-04 NOTE — Progress Notes (Signed)
Phone [phone] In person visit   Subjective:   Kristin Bonilla is a 70 y.o. year old very pleasant female patient who presents for/with See problem oriented charting Chief Complaint  Patient presents with   Follow-up   Hyperlipidemia   Depression   Gastroesophageal Reflux   Right shoulder pain    Pt c/o right shoulder pain down through her wrist that she noticed  6 months ago.    This visit occurred during the SARS-CoV-2 public health emergency.  Safety protocols were in place, including screening questions prior to the visit, additional usage of staff PPE, and extensive cleaning of exam room while observing appropriate contact time as indicated for disinfecting solutions.   Past Medical History-  Patient Active Problem List   Diagnosis Date Noted   Aortic atherosclerosis ([place]) 08/22/2020    Priority: Medium    Leukopenia 05/29/2014    Priority: Medium    CKD (chronic kidney disease), stage III ([place]) 12/20/2013    Priority: Medium    Meniere disease 12/20/2013    Priority: Medium    Major depression in full remission ([place]) 06/07/2007    Priority: Medium    Hyperlipidemia 10/25/2006    Priority: Medium    Allergic rhinitis 07/18/2017    Priority: Low   Osteopenia 12/03/2014    Priority: Low   Insomnia 05/29/2014    Priority: Low   Alopecia 04/27/2009    Priority: Low   GERD (gastroesophageal reflux disease) 01/09/2008    Priority: Low   Irritable bowel syndrome 01/09/2008    Priority: Low   Viral upper respiratory tract infection 12/14/2020   Hematuria, microscopic 10/08/2018   Cataracts, bilateral 06/14/2018   Nuclear sclerosis 06/14/2018    Medications- reviewed and updated Current Outpatient Medications  Medication Sig Dispense Refill   acetaminophen (TYLENOL) 500 MG tablet Take 1,000 mg by mouth daily as needed for mild pain or headache.     albuterol (VENTOLIN HFA) 108 (90 Base) MCG/ACT inhaler Inhale 2 puffs into the lungs every 6 (six) hours as  needed for wheezing or shortness of breath. Inhale 1 puff, hold for a few seconds, wait a moment, then inhale 2nd puff. 8 g 0   aspirin EC 81 MG tablet Take 81 mg by mouth daily.     Biotin 5000 MCG CAPS Take 5,000 mcg by mouth daily.     Calcium Carbonate-Vit D-Min (CALCIUM 1200 PO) Take 1 tablet by mouth daily.     desvenlafaxine (PRISTIQ) 50 MG 24 hr tablet Take 1 tablet (50 mg total) by mouth daily. 90 tablet 3   fenofibrate 160 MG tablet Take 1 tablet (160 mg total) by mouth daily. 90 tablet 3   levocetirizine (XYZAL) 5 MG tablet Take 1 tablet (5 mg total) by mouth every evening. 90 tablet 3   Melatonin 5 MG TABS Take 1 tablet by mouth at bedtime.     montelukast (SINGULAIR) 10 MG tablet Take 1 tablet (10 mg total) by mouth at bedtime. 90 tablet 3   Multiple Vitamin (MULTIVITAMIN) tablet Take 1 tablet by mouth daily.     Omega-3 Fatty Acids (FISH OIL BURP-LESS PO) Take 1 capsule by mouth daily.     omeprazole (PRILOSEC) 20 MG capsule Take 1-2 capsules (20-40 mg total) by mouth daily. 180 capsule 3   polyethylene glycol powder (GLYCOLAX/MIRALAX) 17 GM/SCOOP powder Take 17 g by mouth daily as needed for mild constipation.     Probiotic Product (PROBIOTIC ADVANCED PO) Take by mouth daily.  rosuvastatin (CRESTOR) 5 MG tablet Take 1 tablet (5 mg total) by mouth daily. 90 tablet 3   traZODone (DESYREL) 100 MG tablet Take 1 tablet (100 mg total) by mouth at bedtime. 90 tablet 3   No current facility-administered medications for this visit.     Objective:  BP 122/78    Pulse 86    Temp 98.1 F (36.7 C)    Ht 5\' 2"  (1.575 m)    Wt 158 lb 9.6 oz (71.9 kg)    SpO2 98%    BMI 29.01 kg/m  Gen: NAD, resting comfortably CV: RRR no murmurs rubs or gallops Lungs: CTAB no crackles, wheeze, rhonchi Ext: no edema Skin: warm, dry    Assessment and Plan    #hyperlipidemia-coronary artery calcium score of 1 in 2022 #Aortic atherosclerosis S: Medication: Fenofibrate 160 mg daily-since last visit  and rosuvastatin 5 mg -10-year ASCVD risk has been above 7.5% -wonders if more fatigue since starting statin Lab Results  Component Value Date   CHOL 197 07/24/2020   HDL 74.00 07/24/2020   LDLCALC 105 (H) 07/24/2020   LDLDIRECT 99 10/31/2019   TRIG 90.0 07/24/2020   CHOLHDL 3 07/24/2020   A/P: lipids hopefully improved control with LDL under 70- update LDL today. We also wonder if her symptoms could be contributed to by statin- we opted if at goal or close to trial off rosuvastatin for 3 weeks and journal fatigue levels (also check TSH today). If notes improvement in symptoms could restart every other day with rosuvastatin 5 mg and see how she feels through next visit.   #Chronic kidney disease stage III S: GFR is typically in the 50s range but had been higher at times  -Patient knows to avoid NSAIDs  A/P: hopefully/suspect stable- update cmp today   # GERD S:Medication: Prilosec 20 mg daily- rarely had to use 2nd B12 levels related to PPI use: 394 within 6 months - takes daily vitamin with b12 A/P: reasonable control- continue current meds   # Depression #Insomnia S: Medication: Pristiq 50 mg daily for depression, trazodone 100 mg (but not working well) and melatonin (1 mg) combination for sleep in the past and had discussed possible Belsomra as well as done sleep hygiene counseling  -anxiety contributes Depression screen [facility] 2/9 01/25/2021 07/24/2020 06/01/2020  Decreased Interest 0 0 0  Down, Depressed, Hopeless 0 0 0  PHQ - 2 Score 0 0 0  Altered sleeping 2 3 -  Tired, decreased energy 1 0 -  Change in appetite 0 0 -  Feeling bad or failure about yourself  0 0 -  Trouble concentrating 0 0 -  Moving slowly or fidgety/restless 0 0 -  Suicidal thoughts 0 0 -  PHQ-9 Score 3 3 -  Difficult doing work/chores Not difficult at all Not difficult at all -  Some recent data might be hidden  A/P: depression full remission-  did some counseling on sleep- for now wants to continue current  meds- could continue belsomra in future.   #Right shoulder pain  S: Patient has noted right shoulder pain down if lays on her side- lifting arm up with abduction hurts in anterolateral shoulder. Had seen PT in the past- TENS units helped along with PT  Some wrist pain as well but voltaren gel works well  A/P: right shoulder pain- may have arthritis or even bursitis/rotator cuff issues- offered PT but she wants to try her home PT first (Can call back if she needs to). 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 Has been on Maxide in the past but ENT later took her off medication. She no longer sees ENT -no recent worsening A/P: continue to monitor- consider referral back to ENT- she decliens for now   #Isolated leukopenia-intermittently-we will trend CBC and if worsened or other cell lines affected considered further work-up. Had seen Dr. Irene Limbo in the past in 2018-he thought PPI could be contributed-LDH, B12, TSH, folate, hepatitis C were normal -update today  Recommended follow up: Return in about 6 months (around 07/25/2021) for physical or sooner if needed. Future Appointments  Date Time Provider [place]  03/31/2021  1:30 PM Warren Danes, PA-C [facility]  06/07/2021 11:45 AM [facility] COACH [facility]   Lab/Order associations:   ICD-10-CM   1. Stage 3 chronic kidney disease, unspecified whether stage 3a or 3b CKD (HCC)  N18.30     2. Hyperlipidemia, unspecified hyperlipidemia type  E78.5 CBC with Differential/Platelet    Comprehensive metabolic panel    LDL cholesterol, direct    TSH    3. Aortic atherosclerosis (HCC)  I70.0     4. Major depressive disorder with single episode, in full remission ([place])  F32.5     5. Meniere's disease of right ear  H81.01       No orders of the defined types were placed in  this encounter.   I,Jada Bradford,acting as a scribe for Garret Reddish, MD.,have documented all relevant documentation on the behalf of Garret Reddish, MD,as directed by  Garret Reddish, MD while in the presence of Garret Reddish, MD.  I, Garret Reddish, MD, have reviewed all documentation for this visit. The documentation on 01/25/21 for the exam, diagnosis, procedures, and orders are all accurate and complete.  Return precautions advised.  Garret Reddish, MD

## 2021-01-25 ENCOUNTER — Other Ambulatory Visit: Payer: Self-pay

## 2021-01-25 ENCOUNTER — Encounter: Payer: Self-pay | Admitting: Family Medicine

## 2021-01-25 ENCOUNTER — Ambulatory Visit: Payer: Medicare PPO | Admitting: Family Medicine

## 2021-01-25 VITALS — BP 122/78 | HR 86 | Temp 98.1°F | Ht 62.0 in | Wt 158.6 lb

## 2021-01-25 DIAGNOSIS — E785 Hyperlipidemia, unspecified: Secondary | ICD-10-CM

## 2021-01-25 DIAGNOSIS — I700 Atherosclerosis of aorta: Secondary | ICD-10-CM | POA: Diagnosis not present

## 2021-01-25 DIAGNOSIS — H8101 Meniere's disease, right ear: Secondary | ICD-10-CM | POA: Diagnosis not present

## 2021-01-25 DIAGNOSIS — F325 Major depressive disorder, single episode, in full remission: Secondary | ICD-10-CM

## 2021-01-25 DIAGNOSIS — N183 Chronic kidney disease, stage 3 unspecified: Secondary | ICD-10-CM

## 2021-01-25 NOTE — Patient Instructions (Addendum)
Health Maintenance Due  Topic Date Due   TETANUS/TDAP - recommend at pharmacy (cheaper there) 11/25/2020   Please stop by lab before you go If you have mychart- we will send your results within 3 business days of [place] receiving them.  If you do not have mychart- we will call you about results within 5 business days of [place] receiving them.  *please also note that you will see labs on mychart as soon as they post. I will later go in and write notes on them- will say "notes from Dr. Yong Channel"   We also wonder if her symptoms could be contributed to by statin- we opted if at goal or close to trial off rosuvastatin for 3 weeks and journal fatigue levels (also check TSH today). If notes improvement in symptoms could restart every other day with rosuvastatin 5 mg and see how she feels through next visit.   Recommended follow up: Return in about 6 months (around 07/25/2021) for physical or sooner if needed.

## 2021-01-26 LAB — CBC WITH DIFFERENTIAL/PLATELET
Basophils Absolute: 0 10*3/uL (ref 0.0–0.1)
Basophils Relative: 0.8 % (ref 0.0–3.0)
Eosinophils Absolute: 0.1 10*3/uL (ref 0.0–0.7)
Eosinophils Relative: 3 % (ref 0.0–5.0)
HCT: 40.8 % (ref 36.0–46.0)
Hemoglobin: 13.5 g/dL (ref 12.0–15.0)
Lymphocytes Relative: 37.2 % (ref 12.0–46.0)
Lymphs Abs: 1.6 10*3/uL (ref 0.7–4.0)
MCHC: 33.1 g/dL (ref 30.0–36.0)
MCV: 89 fl (ref 78.0–100.0)
Monocytes Absolute: 0.4 10*3/uL (ref 0.1–1.0)
Monocytes Relative: 9 % (ref 3.0–12.0)
Neutro Abs: 2.1 10*3/uL (ref 1.4–7.7)
Neutrophils Relative %: 50 % (ref 43.0–77.0)
Platelets: 282 10*3/uL (ref 150.0–400.0)
RBC: 4.59 Mil/uL (ref 3.87–5.11)
RDW: 13.3 % (ref 11.5–15.5)
WBC: 4.3 10*3/uL (ref 4.0–10.5)

## 2021-01-26 LAB — TSH: TSH: 2.17 u[IU]/mL (ref 0.35–5.50)

## 2021-01-28 ENCOUNTER — Telehealth: Payer: Self-pay | Admitting: Family Medicine

## 2021-01-28 ENCOUNTER — Other Ambulatory Visit: Payer: Self-pay

## 2021-01-28 DIAGNOSIS — E785 Hyperlipidemia, unspecified: Secondary | ICD-10-CM

## 2021-01-28 NOTE — Telephone Encounter (Signed)
Please call patient to schedule for labs to be redrawn. Per [employer]

## 2021-01-28 NOTE — Telephone Encounter (Signed)
Thank you very much for the update-glad these will be redrawn

## 2021-01-28 NOTE — Telephone Encounter (Signed)
Called and spoke with pt and she will come by tomorrow for redraw.  Dr. Yong Channel incase you were looking for these labs, the patients blood clotted so they were not able to get a good sample on these.

## 2021-01-29 ENCOUNTER — Other Ambulatory Visit: Payer: Self-pay

## 2021-01-29 ENCOUNTER — Other Ambulatory Visit (INDEPENDENT_AMBULATORY_CARE_PROVIDER_SITE_OTHER): Payer: Medicare PPO

## 2021-01-29 DIAGNOSIS — E785 Hyperlipidemia, unspecified: Secondary | ICD-10-CM

## 2021-01-29 LAB — COMPREHENSIVE METABOLIC PANEL
ALT: 18 U/L (ref 0–35)
AST: 27 U/L (ref 0–37)
Albumin: 4.8 g/dL (ref 3.5–5.2)
Alkaline Phosphatase: 60 U/L (ref 39–117)
BUN: 19 mg/dL (ref 6–23)
CO2: 28 mEq/L (ref 19–32)
Calcium: 11 mg/dL — ABNORMAL HIGH (ref 8.4–10.5)
Chloride: 104 mEq/L (ref 96–112)
Creatinine, Ser: 1.01 mg/dL (ref 0.40–1.20)
GFR: 55.98 mL/min — ABNORMAL LOW (ref >60.00)
Glucose, Bld: 88 mg/dL (ref 70–99)
Potassium: 5.4 mEq/L — ABNORMAL HIGH (ref 3.5–5.1)
Sodium: 144 mEq/L (ref 135–145)
Total Bilirubin: 0.5 mg/dL (ref 0.2–1.2)
Total Protein: 7.1 g/dL (ref 6.0–8.3)

## 2021-01-29 LAB — LDL CHOLESTEROL, DIRECT: Direct LDL: 66 mg/dL

## 2021-03-26 LAB — HM MAMMOGRAPHY

## 2021-03-31 ENCOUNTER — Ambulatory Visit: Payer: Medicare PPO | Admitting: Physician Assistant

## 2021-03-31 ENCOUNTER — Encounter: Payer: Self-pay | Admitting: Physician Assistant

## 2021-03-31 ENCOUNTER — Other Ambulatory Visit: Payer: Self-pay

## 2021-03-31 DIAGNOSIS — L570 Actinic keratosis: Secondary | ICD-10-CM

## 2021-03-31 DIAGNOSIS — Z1283 Encounter for screening for malignant neoplasm of skin: Secondary | ICD-10-CM

## 2021-03-31 DIAGNOSIS — Z808 Family history of malignant neoplasm of other organs or systems: Secondary | ICD-10-CM

## 2021-03-31 NOTE — Progress Notes (Signed)
? ?  Follow-Up Visit ?  ?Subjective  ?Kristin Bonilla is a 71 y.o. female who presents for the following: Annual Exam (Patient here for full body skin check, no personal history of skin cancer or atypia. Patient stated her father passed from MM. Only new lesion x 1 week left breast pink no bleeding. ? ? ?The following portions of the chart were reviewed this encounter and updated as appropriate:  Tobacco  Allergies  Meds  Problems  Med Hx  Surg Hx  Fam Hx   ?  ? ?Objective  ?Well appearing patient in no apparent distress; mood and affect are within normal limits. ? ?A full examination was performed including scalp, head, eyes, ears, nose, lips, neck, chest, axillae, abdomen, back, buttocks, bilateral upper extremities, bilateral lower extremities, hands, feet, fingers, toes, fingernails, and toenails. All findings within normal limits unless otherwise noted below. ? ?Dorsum of Nose ?Erythematous patches with gritty scale. ? ? ?Assessment & Plan  ?AK (actinic keratosis) ?Dorsum of Nose ? ?Destruction of lesion - Dorsum of Nose ?Complexity: simple   ?Destruction method: cryotherapy   ?Informed consent: discussed and consent obtained   ?Timeout:  patient name, date of birth, surgical site, and procedure verified ?Lesion destroyed using liquid nitrogen: Yes   ?Cryotherapy cycles:  1 ?Outcome: patient tolerated procedure well with no complications   ?Post-procedure details: wound care instructions given   ? ?No atypical nevi noted at the time of the visit. ? ?I, Cleotis Sparr, PA-C, have reviewed all documentation's for this visit.  The documentation on 03/31/21 for the exam, diagnosis, procedures and orders are all accurate and complete. ?

## 2021-06-03 DIAGNOSIS — Z01419 Encounter for gynecological examination (general) (routine) without abnormal findings: Secondary | ICD-10-CM | POA: Diagnosis not present

## 2021-06-03 DIAGNOSIS — Z6829 Body mass index (BMI) 29.0-29.9, adult: Secondary | ICD-10-CM | POA: Diagnosis not present

## 2021-06-03 DIAGNOSIS — Z1231 Encounter for screening mammogram for malignant neoplasm of breast: Secondary | ICD-10-CM | POA: Diagnosis not present

## 2021-06-07 ENCOUNTER — Ambulatory Visit (INDEPENDENT_AMBULATORY_CARE_PROVIDER_SITE_OTHER): Payer: Medicare PPO

## 2021-06-07 DIAGNOSIS — Z0000 Encounter for general adult medical examination without abnormal findings: Secondary | ICD-10-CM | POA: Diagnosis not present

## 2021-06-07 NOTE — Patient Instructions (Signed)
Kristin Bonilla , Thank you for taking time to come for your Medicare Wellness Visit. I appreciate your ongoing commitment to your health goals. Please review the following plan we discussed and let me know if I can assist you in the future.   Screening recommendations/referrals: Colonoscopy: Done 03/14/17 repeat every 5 years  Mammogram: Done 03/26/21. Repeat every year  Bone Density: Done 06/11/20 repeat every  2 years  Recommended yearly ophthalmology/optometry visit for glaucoma screening and checkup Recommended yearly dental visit for hygiene and checkup  Vaccinations: Influenza vaccine: Done 10/31/20 repeat every year  Pneumococcal vaccine: Up to date Tdap vaccine: due  Shingles vaccine: Completed 8/25, 11/10/17    Covid-19:Completed 2/13, 3/7, 10/18/19  Advanced directives: Please bring a copy of your health care power of attorney and living will to the office at your convenience.  Conditions/risks identified: none at this time   Next appointment: Follow up in one year for your annual wellness visit    Preventive Care 65 Years and Older, Female Preventive care refers to lifestyle choices and visits with your health care provider that can promote health and wellness. What does preventive care include? A yearly physical exam. This is also called an annual well check. Dental exams once or twice a year. Routine eye exams. Ask your health care provider how often you should have your eyes checked. Personal lifestyle choices, including: Daily care of your teeth and gums. Regular physical activity. Eating a healthy diet. Avoiding tobacco and drug use. Limiting alcohol use. Practicing safe sex. Taking low-dose aspirin every day. Taking vitamin and mineral supplements as recommended by your health care provider. What happens during an annual well check? The services and screenings done by your health care provider during your annual well check will depend on your age, overall health,  lifestyle risk factors, and family history of disease. Counseling  Your health care provider may ask you questions about your: Alcohol use. Tobacco use. Drug use. Emotional well-being. Home and relationship well-being. Sexual activity. Eating habits. History of falls. Memory and ability to understand (cognition). Work and work Statistician. Reproductive health. Screening  You may have the following tests or measurements: Height, weight, and BMI. Blood pressure. Lipid and cholesterol levels. These may be checked every 5 years, or more frequently if you are over 75 years old. Skin check. Lung cancer screening. You may have this screening every year starting at age 88 if you have a 30-pack-year history of smoking and currently smoke or have quit within the past 15 years. Fecal occult blood test (FOBT) of the stool. You may have this test every year starting at age 78. Flexible sigmoidoscopy or colonoscopy. You may have a sigmoidoscopy every 5 years or a colonoscopy every 10 years starting at age 38. Hepatitis C blood test. Hepatitis B blood test. Sexually transmitted disease (STD) testing. Diabetes screening. This is done by checking your blood sugar (glucose) after you have not eaten for a while (fasting). You may have this done every 1-3 years. Bone density scan. This is done to screen for osteoporosis. You may have this done starting at age 42. Mammogram. This may be done every 1-2 years. Talk to your health care provider about how often you should have regular mammograms. Talk with your health care provider about your test results, treatment options, and if necessary, the need for more tests. Vaccines  Your health care provider may recommend certain vaccines, such as: Influenza vaccine. This is recommended every year. Tetanus, diphtheria, and acellular pertussis (Tdap,  Td) vaccine. You may need a Td booster every 10 years. Zoster vaccine. You may need this after age  53. Pneumococcal 13-valent conjugate (PCV13) vaccine. One dose is recommended after age 31. Pneumococcal polysaccharide (PPSV23) vaccine. One dose is recommended after age 79. Talk to your health care provider about which screenings and vaccines you need and how often you need them. This information is not intended to replace advice given to you by your health care provider. Make sure you discuss any questions you have with your health care provider. Document Released: 01/30/2015 Document Revised: 09/23/2015 Document Reviewed: 11/04/2014 Elsevier Interactive Patient Education  2017 [place] Prevention in the Home Falls can cause injuries. They can happen to people of all ages. There are many things you can do to make your home safe and to help prevent falls. What can I do on the outside of my home? Regularly fix the edges of walkways and driveways and fix any cracks. Remove anything that might make you trip as you walk through a door, such as a raised step or threshold. Trim any bushes or trees on the path to your home. Use bright outdoor lighting. Clear any walking paths of anything that might make someone trip, such as rocks or tools. Regularly check to see if handrails are loose or broken. Make sure that both sides of any steps have handrails. Any raised decks and porches should have guardrails on the edges. Have any leaves, snow, or ice cleared regularly. Use sand or salt on walking paths during winter. Clean up any spills in your garage right away. This includes oil or grease spills. What can I do in the bathroom? Use night lights. Install grab bars by the toilet and in the tub and shower. Do not use towel bars as grab bars. Use non-skid mats or decals in the tub or shower. If you need to sit down in the shower, use a plastic, non-slip stool. Keep the floor dry. Clean up any water that spills on the floor as soon as it happens. Remove soap buildup in the tub or shower  regularly. Attach bath mats securely with double-sided non-slip rug tape. Do not have throw rugs and other things on the floor that can make you trip. What can I do in the bedroom? Use night lights. Make sure that you have a light by your bed that is easy to reach. Do not use any sheets or blankets that are too big for your bed. They should not hang down onto the floor. Have a firm chair that has side arms. You can use this for support while you get dressed. Do not have throw rugs and other things on the floor that can make you trip. What can I do in the kitchen? Clean up any spills right away. Avoid walking on wet floors. Keep items that you use a lot in easy-to-reach places. If you need to reach something above you, use a strong step stool that has a grab bar. Keep electrical cords out of the way. Do not use floor polish or wax that makes floors slippery. If you must use wax, use non-skid floor wax. Do not have throw rugs and other things on the floor that can make you trip. What can I do with my stairs? Do not leave any items on the stairs. Make sure that there are handrails on both sides of the stairs and use them. Fix handrails that are broken or loose. Make sure that handrails are as  long as the stairways. Check any carpeting to make sure that it is firmly attached to the stairs. Fix any carpet that is loose or worn. Avoid having throw rugs at the top or bottom of the stairs. If you do have throw rugs, attach them to the floor with carpet tape. Make sure that you have a light switch at the top of the stairs and the bottom of the stairs. If you do not have them, ask someone to add them for you. What else can I do to help prevent falls? Wear shoes that: Do not have high heels. Have rubber bottoms. Are comfortable and fit you well. Are closed at the toe. Do not wear sandals. If you use a stepladder: Make sure that it is fully opened. Do not climb a closed stepladder. Make sure that  both sides of the stepladder are locked into place. Ask someone to hold it for you, if possible. Clearly mark and make sure that you can see: Any grab bars or handrails. First and last steps. Where the edge of each step is. Use tools that help you move around (mobility aids) if they are needed. These include: Canes. Walkers. Scooters. Crutches. Turn on the lights when you go into a dark area. Replace any light bulbs as soon as they burn out. Set up your furniture so you have a clear path. Avoid moving your furniture around. If any of your floors are uneven, fix them. If there are any pets around you, be aware of where they are. Review your medicines with your doctor. Some medicines can make you feel dizzy. This can increase your chance of falling. Ask your doctor what other things that you can do to help prevent falls. This information is not intended to replace advice given to you by your health care provider. Make sure you discuss any questions you have with your health care provider. Document Released: 10/30/2008 Document Revised: 06/11/2015 Document Reviewed: 02/07/2014 Elsevier Interactive Patient Education  2017 [employer].

## 2021-06-07 NOTE — Progress Notes (Cosign Needed Addendum)
Virtual Visit via Telephone Note  I connected with  Kristin Bonilla on 06/07/21 at 12:00 PM EDT by telephone and verified that I am speaking with the correct person using two identifiers.  Medicare Annual Wellness visit completed telephonically due to Covid-19 pandemic.   Persons participating in this call: This Health Coach and this patient.   Location: Patient: Home Provider: Office    I discussed the limitations, risks, security and privacy concerns of performing an evaluation and management service by telephone and the availability of in person appointments. The patient expressed understanding and agreed to proceed.  Unable to perform video visit due to video visit attempted and failed and/or patient does not have video capability.   Some vital signs may be absent or patient reported.   Willette Brace, LPN   Subjective:   Kristin Bonilla is a 71 y.o. female who presents for Medicare Annual (Subsequent) preventive examination.  Review of Systems     Cardiac Risk Factors include: advanced age (>65mn, >>39women);dyslipidemia     Objective:    There were no vitals filed for this visit. There is no height or weight on file to calculate BMI.     06/07/2021   12:00 PM 06/01/2020   11:25 AM 05/24/2019    2:48 PM 08/01/2017    1:24 PM 07/18/2016    3:31 PM 01/19/2016    1:57 PM 11/01/2014    6:38 PM  Advanced Directives  Does Patient Have a Medical Advance Directive? Yes Yes Yes Yes Yes No No  Type of AParamedicof [place]ld[place]Living will Healthcare Power of Attorney Living will;Healthcare Power of [place]of [place] will    Does patient want to make changes to medical advance directive?   No - Patient declined No - Patient declined     Copy of [place] Chart? No - copy requested No - copy requested No - copy requested No - copy requested No - copy requested    Would patient like  information on creating a medical advance directive?       No - patient declined information    Current Medications (verified) Outpatient Encounter Medications as of 06/07/2021  Medication Sig   acetaminophen (TYLENOL) 500 MG tablet Take 1,000 mg by mouth daily as needed for mild pain or headache.   albuterol (VENTOLIN HFA) 108 (90 Base) MCG/ACT inhaler Inhale 2 puffs into the lungs every 6 (six) hours as needed for wheezing or shortness of breath. Inhale 1 puff, hold for a few seconds, wait a moment, then inhale 2nd puff.   aspirin EC 81 MG tablet Take 81 mg by mouth daily.   Biotin 5000 MCG CAPS Take 5,000 mcg by mouth daily.   Calcium Carbonate-Vit D-Min (CALCIUM 1200 PO) Take 1 tablet by mouth daily.   desvenlafaxine (PRISTIQ) 50 MG 24 hr tablet Take 1 tablet (50 mg total) by mouth daily.   fenofibrate 160 MG tablet Take 1 tablet (160 mg total) by mouth daily.   levocetirizine (XYZAL) 5 MG tablet Take 1 tablet (5 mg total) by mouth every evening.   Melatonin 5 MG TABS Take 1 tablet by mouth at bedtime.   montelukast (SINGULAIR) 10 MG tablet Take 1 tablet (10 mg total) by mouth at bedtime.   Multiple Vitamin (MULTIVITAMIN) tablet Take 1 tablet by mouth daily.   Omega-3 Fatty Acids (FISH OIL BURP-LESS PO) Take 1 capsule by mouth daily.   omeprazole (  PRILOSEC) 20 MG capsule Take 1-2 capsules (20-40 mg total) by mouth daily.   polyethylene glycol powder (GLYCOLAX/MIRALAX) 17 GM/SCOOP powder Take 17 g by mouth daily as needed for mild constipation.   Probiotic Product (PROBIOTIC ADVANCED PO) Take by mouth daily.   rosuvastatin (CRESTOR) 5 MG tablet Take 1 tablet (5 mg total) by mouth daily.   traZODone (DESYREL) 100 MG tablet Take 1 tablet (100 mg total) by mouth at bedtime.   [DISCONTINUED] cetirizine (ZYRTEC) 10 MG tablet Take 10 mg by mouth daily.     No facility-administered encounter medications on file as of 06/07/2021.    Allergies (verified) Erythromycin, Sulfa antibiotics, and  Sulfonamide derivatives   History: Past Medical History:  Diagnosis Date   Arthritis    Cataracts, bilateral 06/14/2018   GERD (gastroesophageal reflux disease)    Hyperlipidemia    Internal thrombosed hemorrhoids 01/09/2008   Meniere disease    Menopause    Nuclear sclerosis 06/14/2018   History reviewed. No pertinent surgical history. Family History  Problem Relation Age of Onset   Cancer Father        melanoma   Heart disease Father        Died at 2   Diverticulosis Father    COPD Mother    Osteoporosis Mother    Hiatal hernia Mother    Stroke Paternal Grandmother    Heart disease Other        died 78, around age 38-CABG, smoker   GER disease Son    Mental retardation Son    Heart disease Son    Social History   Socioeconomic History   Marital status: Married    Spouse name: Not on file   Number of children: Not on file   Years of education: Not on file   Highest education level: Not on file  Occupational History   Not on file  Tobacco Use   Smoking status: Never   Smokeless tobacco: Never  Vaping Use   Vaping Use: Never used  Substance and Sexual Activity   Alcohol use: No    Alcohol/week: 0.0 standard drinks   Drug use: No   Sexual activity: Yes  Other Topics Concern   Not on file  Social History Narrative   Married. 2 children (son is special needs-in group home living). 1 grandchild      Retired from [employer] different grades      Hobbies: reading, ipad, former bowling a lot   Social Determinants of Radio broadcast assistant Strain: Low Risk    Difficulty of Paying Living Expenses: Not hard at all  Food Insecurity: No Food Insecurity   Worried About Charity fundraiser in the Last Year: Never true   Arboriculturist in the Last Year: Never true  Transportation Needs: No Transportation Needs   Lack of Transportation (Medical): No   Lack of Transportation (Non-Medical): No  Physical Activity: Inactive   Days of Exercise  per Week: 0 days   Minutes of Exercise per Session: 0 min  Stress: Stress Concern Present   Feeling of Stress : To some extent  Social Connections: Moderately Integrated   Frequency of Communication with Friends and Family: More than three times a week   Frequency of Social Gatherings with Friends and Family: Three times a week   Attends Religious Services: More than 4 times per year   Active Member of Clubs or Organizations: No   Attends Archivist Meetings: Never  Marital Status: Married    Tobacco Counseling Counseling given: Not Answered   Clinical Intake:  Pre-visit preparation completed: Yes  Pain : No/denies pain     BMI - recorded: 29.01 Nutritional Status: BMI 25 -29 Overweight Nutritional Risks: None Diabetes: No  How often do you need to have someone help you when you read instructions, pamphlets, or other written materials from your doctor or pharmacy?: 1 - Never  Diabetic?no  Interpreter Needed?: No  Information entered by :: Charlott Rakes, LPN   Activities of Daily Living    06/07/2021   12:03 PM  In your present state of health, do you have any difficulty performing the following activities:  Hearing? 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 0  Difficulty concentrating or making decisions? 0  Walking or climbing stairs? 0  Dressing or bathing? 0  Doing errands, shopping? 0  Preparing Food and eating ? N  Using the Toilet? N  In the past six months, have you accidently leaked urine? Y  Comment at times wears a pad  Do you have problems with loss of bowel control? N  Managing your Medications? N  Managing your Finances? N  Housekeeping or managing your Housekeeping? N    Patient Care Team: Marin Olp, MD as PCP - General (Family Medicine) Dian Queen, MD as Consulting Physician (Obstetrics and Gynecology) Erlene Quan [place] Calix, MD as Referring Physician (Internal  Medicine) Warren Danes, PA-C as Physician Assistant (Dermatology) Hortencia Pilar, MD as Consulting Physician (Surgery) Hortencia Pilar, MD as Consulting Physician (Optometry)  Indicate any recent Medical Services you may have received from other than [facility] providers in the past year (date may be approximate).     Assessment:   This is a routine wellness examination for [facility].  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  Vision Screening - Comments:: Pt follows up with Dr Isidore Moos office for annual eye exams   Dietary issues and exercise activities discussed: Current Exercise Habits: The patient does not participate in regular exercise at present   Goals Addressed             This Visit's Progress    Patient Stated       Continue to loose weight        Depression Screen    06/07/2021   11:56 AM 01/25/2021    2:31 PM 07/24/2020   10:32 AM 06/01/2020   11:21 AM 10/31/2019   11:19 AM 05/31/2019   10:56 AM 05/24/2019    2:49 PM  PHQ 2/9 Scores  PHQ - 2 Score 1 0 0 0 0 0 0  PHQ- 9 Score  '3 3  4 2     '$ Fall Risk    06/07/2021   12:02 PM 06/01/2020   11:26 AM 05/24/2019    2:48 PM 08/07/2018   12:27 PM 08/01/2017    1:29 PM  Fall Risk   Falls in the past year? 1 0 0 0 No  Number falls in past yr: 1 0 0    Injury with Fall? 1 0 0    [place]      Risk for fall due to : Impaired vision Impaired vision     Follow up Falls prevention discussed Falls prevention discussed Falls evaluation completed;Education provided;Falls prevention discussed      FALL RISK PREVENTION PERTAINING  TO THE HOME:  Any stairs in or around the home? Yes  If so, are there any without handrails? No  Home free of loose throw rugs in walkways, pet beds, electrical cords, etc? Yes  Adequate lighting in your home to reduce risk of falls? Yes   ASSISTIVE DEVICES UTILIZED TO PREVENT  FALLS:  Life alert? No  Use of a cane, walker or w/c? No  Grab bars in the bathroom? Yes  Shower chair or bench in shower? Yes  Elevated toilet seat or a handicapped toilet? No   TIMED UP AND GO:  Was the test performed? No .   Cognitive Function:        06/07/2021   12:04 PM 06/01/2020   11:28 AM 05/24/2019    2:48 PM 08/01/2017    1:36 PM  6CIT Screen  What Year? 0 points 0 points 0 points 0 points  What month? 0 points 0 points 0 points 0 points  What time? 0 points  0 points 0 points  Count back from 20 0 points 0 points 0 points 0 points  Months in reverse 0 points 0 points 0 points 0 points  Repeat phrase 0 points 2 points 0 points   Total Score 0 points  0 points     Immunizations Immunization History  Administered Date(s) Administered   Fluad Quad(high Dose 65+) 09/13/2018, 10/28/2019   Influenza Split 11/26/2010, 12/12/2011   Influenza Whole 10/18/2006, 11/10/2008, 11/24/2009   Influenza, High Dose Seasonal PF 11/27/2015   Influenza,inj,Quad PF,6+ Mos 12/12/2012, 10/29/2013, 12/03/2014, 10/31/2020   Influenza-Unspecified 10/22/2016, 09/18/2017   PFIZER(Purple Top)SARS-COV-2 Vaccination 03/02/2019, 03/24/2019, 10/28/2019   Pneumococcal Conjugate-13 12/03/2014   Pneumococcal Polysaccharide-23 12/12/2011, 01/06/2017   Td 01/18/2000   Tdap 11/26/2010   Zoster Recombinat (Shingrix) 09/10/2017, 11/10/2017   Zoster, Live 04/09/2012    TDAP status: Due, Education has been provided regarding the importance of this vaccine. Advised may receive this vaccine at local pharmacy or Health Dept. Aware to provide a copy of the vaccination record if obtained from local pharmacy or Health Dept. Verbalized acceptance and understanding.  Flu Vaccine status: Up to date  Pneumococcal vaccine status: Up to date  Covid-19 vaccine status: Completed vaccines  Qualifies for Shingles Vaccine? Yes   Zostavax completed Yes   Shingrix Completed?: Yes  Screening Tests Health  Maintenance  Topic Date Due   TETANUS/TDAP  11/25/2020   INFLUENZA VACCINE  08/17/2021   COLONOSCOPY (Pts 45-1yr Insurance coverage will need to be confirmed)  03/14/2022   MAMMOGRAM  03/27/2022   Pneumonia Vaccine 71 Years old  Completed   DEXA SCAN  Completed   Hepatitis C Screening  Completed   Zoster Vaccines- Shingrix  Completed   HPV VACCINES  Aged Out   COVID-19 Vaccine  Discontinued    Health Maintenance  Health Maintenance Due  Topic Date Due   TETANUS/TDAP  11/25/2020    Colorectal cancer screening: Type of screening: Colonoscopy. Completed 03/14/17. Repeat every 5 years  Mammogram status: Completed 03/26/21. Repeat every year  Bone Density status: Completed 06/11/20. Results reflect: Bone density results: OSTEOPENIA. Repeat every 2 years.  Additional Screening:  Hepatitis C Screening:  Completed 01/19/16  Vision Screening: Recommended annual ophthalmology exams for early detection of glaucoma and other disorders of the eye. Is the patient up to date with their annual eye exam?  Yes  Who is the provider or what is the name of the office in which the patient attends annual eye exams?  Dr WKathlen Mody  office  If pt is not established with a provider, would they like to be referred to a provider to establish care? No .   Dental Screening: Recommended annual dental exams for proper oral hygiene  Community Resource Referral / Chronic Care Management: CRR required this visit?  No   CCM required this visit?  No      Plan:     I have personally reviewed and noted the following in the patient's chart:   Medical and social history Use of alcohol, tobacco or illicit drugs  Current medications and supplements including opioid prescriptions.  Functional ability and status Nutritional status Physical activity Advanced directives List of other physicians Hospitalizations, surgeries, and ER visits in previous 12 months Vitals Screenings to include cognitive, depression,  and falls Referrals and appointments  In addition, I have reviewed and discussed with patient certain preventive protocols, quality metrics, and best practice recommendations. A written personalized care plan for preventive services as well as general preventive health recommendations were provided to patient.     Willette Brace, LPN   04/07/252   Nurse Notes: None

## 2021-07-01 DIAGNOSIS — H3509 Other intraretinal microvascular abnormalities: Secondary | ICD-10-CM | POA: Diagnosis not present

## 2021-07-01 DIAGNOSIS — H25813 Combined forms of age-related cataract, bilateral: Secondary | ICD-10-CM | POA: Diagnosis not present

## 2021-07-01 DIAGNOSIS — H04123 Dry eye syndrome of bilateral lacrimal glands: Secondary | ICD-10-CM | POA: Diagnosis not present

## 2021-07-01 DIAGNOSIS — H43812 Vitreous degeneration, left eye: Secondary | ICD-10-CM | POA: Diagnosis not present

## 2021-07-01 DIAGNOSIS — H524 Presbyopia: Secondary | ICD-10-CM | POA: Diagnosis not present

## 2021-07-01 LAB — HM DIABETES EYE EXAM

## 2021-07-22 ENCOUNTER — Other Ambulatory Visit: Payer: Self-pay | Admitting: Family Medicine

## 2021-07-29 ENCOUNTER — Ambulatory Visit (INDEPENDENT_AMBULATORY_CARE_PROVIDER_SITE_OTHER): Payer: Medicare PPO | Admitting: Family Medicine

## 2021-07-29 ENCOUNTER — Encounter: Payer: Self-pay | Admitting: Family Medicine

## 2021-07-29 VITALS — BP 110/72 | HR 73 | Temp 98.1°F | Ht 62.0 in | Wt 159.2 lb

## 2021-07-29 DIAGNOSIS — M542 Cervicalgia: Secondary | ICD-10-CM

## 2021-07-29 DIAGNOSIS — N3281 Overactive bladder: Secondary | ICD-10-CM | POA: Diagnosis not present

## 2021-07-29 DIAGNOSIS — R3915 Urgency of urination: Secondary | ICD-10-CM | POA: Diagnosis not present

## 2021-07-29 DIAGNOSIS — Z0001 Encounter for general adult medical examination with abnormal findings: Secondary | ICD-10-CM | POA: Diagnosis not present

## 2021-07-29 DIAGNOSIS — I700 Atherosclerosis of aorta: Secondary | ICD-10-CM | POA: Diagnosis not present

## 2021-07-29 DIAGNOSIS — Z87448 Personal history of other diseases of urinary system: Secondary | ICD-10-CM

## 2021-07-29 DIAGNOSIS — N183 Chronic kidney disease, stage 3 unspecified: Secondary | ICD-10-CM

## 2021-07-29 DIAGNOSIS — E785 Hyperlipidemia, unspecified: Secondary | ICD-10-CM

## 2021-07-29 DIAGNOSIS — Z79899 Other long term (current) drug therapy: Secondary | ICD-10-CM | POA: Diagnosis not present

## 2021-07-29 LAB — LIPID PANEL
Cholesterol: 166 mg/dL (ref 0–200)
HDL: 82.1 mg/dL (ref >39.00)
LDL Cholesterol: 69 mg/dL (ref 0–99)
NonHDL: 83.99
Total CHOL/HDL Ratio: 2
Triglycerides: 73 mg/dL (ref 0.0–149.0)
VLDL: 14.6 mg/dL (ref 0.0–40.0)

## 2021-07-29 LAB — COMPREHENSIVE METABOLIC PANEL
ALT: 15 U/L (ref 0–35)
AST: 22 U/L (ref 0–37)
Albumin: 4.6 g/dL (ref 3.5–5.2)
Alkaline Phosphatase: 55 U/L (ref 39–117)
BUN: 20 mg/dL (ref 6–23)
CO2: 29 mEq/L (ref 19–32)
Calcium: 10.2 mg/dL (ref 8.4–10.5)
Chloride: 102 mEq/L (ref 96–112)
Creatinine, Ser: 0.94 mg/dL (ref 0.40–1.20)
GFR: 60.81 mL/min (ref >60.00)
Glucose, Bld: 93 mg/dL (ref 70–99)
Potassium: 3.9 mEq/L (ref 3.5–5.1)
Sodium: 140 mEq/L (ref 135–145)
Total Bilirubin: 0.4 mg/dL (ref 0.2–1.2)
Total Protein: 6.7 g/dL (ref 6.0–8.3)

## 2021-07-29 LAB — POC URINALSYSI DIPSTICK (AUTOMATED)
Bilirubin, UA: NEGATIVE
Blood, UA: NEGATIVE
Glucose, UA: NEGATIVE
Ketones, UA: NEGATIVE
Leukocytes, UA: NEGATIVE
Nitrite, UA: NEGATIVE
Protein, UA: NEGATIVE
Spec Grav, UA: 1.015 (ref 1.010–1.025)
Urobilinogen, UA: 0.2 E.U./dL
pH, UA: 7 (ref 5.0–8.0)

## 2021-07-29 LAB — CBC WITH DIFFERENTIAL/PLATELET
Basophils Absolute: 0.1 10*3/uL (ref 0.0–0.1)
Basophils Relative: 2.4 % (ref 0.0–3.0)
Eosinophils Absolute: 0.1 10*3/uL (ref 0.0–0.7)
Eosinophils Relative: 3.4 % (ref 0.0–5.0)
HCT: 40.4 % (ref 36.0–46.0)
Hemoglobin: 13.4 g/dL (ref 12.0–15.0)
Lymphocytes Relative: 49 % — ABNORMAL HIGH (ref 12.0–46.0)
Lymphs Abs: 1.8 10*3/uL (ref 0.7–4.0)
MCHC: 33.2 g/dL (ref 30.0–36.0)
MCV: 91.2 fl (ref 78.0–100.0)
Monocytes Absolute: 0.3 10*3/uL (ref 0.1–1.0)
Monocytes Relative: 8.8 % (ref 3.0–12.0)
Neutro Abs: 1.4 10*3/uL (ref 1.4–7.7)
Neutrophils Relative %: 36.4 % — ABNORMAL LOW (ref 43.0–77.0)
Platelets: 255 10*3/uL (ref 150.0–400.0)
RBC: 4.43 Mil/uL (ref 3.87–5.11)
RDW: 13.7 % (ref 11.5–15.5)
WBC: 3.7 10*3/uL — ABNORMAL LOW (ref 4.0–10.5)

## 2021-07-29 LAB — VITAMIN B12: Vitamin B-12: 457 pg/mL (ref 211–911)

## 2021-07-29 MED ORDER — MIRABEGRON ER 25 MG PO TB24: 25.0000 mg | ORAL_TABLET | Freq: Every day | ORAL | 5 refills | Status: DC

## 2021-07-29 NOTE — Progress Notes (Signed)
Phone [phone] In person visit   Subjective:   Kristin Bonilla is a 71 y.o. year old very pleasant female patient who presents for/with See problem oriented charting  Past Medical History-  Patient Active Problem List   Diagnosis Date Noted   Aortic atherosclerosis ([place]) 08/22/2020    Priority: Medium    Leukopenia 05/29/2014    Priority: Medium    CKD (chronic kidney disease), stage III ([place]) 12/20/2013    Priority: Medium    Meniere disease 12/20/2013    Priority: Medium    Major depression in full remission ([place]) 06/07/2007    Priority: Medium    Hyperlipidemia 10/25/2006    Priority: Medium    Allergic rhinitis 07/18/2017    Priority: Low   Osteopenia 12/03/2014    Priority: Low   Insomnia 05/29/2014    Priority: Low   Alopecia 04/27/2009    Priority: Low   GERD (gastroesophageal reflux disease) 01/09/2008    Priority: Low   Irritable bowel syndrome 01/09/2008    Priority: Low   Overactive bladder 07/29/2021   Viral upper respiratory tract infection 12/14/2020   Hematuria, microscopic 10/08/2018   Cataracts, bilateral 06/14/2018   Nuclear sclerosis 06/14/2018    Medications- reviewed and updated Current Outpatient Medications  Medication Sig Dispense Refill   acetaminophen (TYLENOL) 500 MG tablet Take 1,000 mg by mouth daily as needed for mild pain or headache.     albuterol (VENTOLIN HFA) 108 (90 Base) MCG/ACT inhaler Inhale 2 puffs into the lungs every 6 (six) hours as needed for wheezing or shortness of breath. Inhale 1 puff, hold for a few seconds, wait a moment, then inhale 2nd puff. 8 g 0   aspirin EC 81 MG tablet Take 81 mg by mouth daily.     Biotin 5000 MCG CAPS Take 5,000 mcg by mouth daily.     Calcium Carbonate-Vit D-Min (CALCIUM 1200 PO) Take 1 tablet by mouth daily.     desvenlafaxine (PRISTIQ) 50 MG 24 hr tablet Take 1 tablet (50 mg total) by mouth daily. 90 tablet 3   fenofibrate 160 MG tablet Take 1 tablet (160 mg total) by mouth daily. 90  tablet 3   levocetirizine (XYZAL) 5 MG tablet Take 1 tablet (5 mg total) by mouth every evening. 90 tablet 3   Melatonin 5 MG TABS Take 1 tablet by mouth at bedtime.     mirabegron ER (MYRBETRIQ) 25 MG TB24 tablet Take 1 tablet (25 mg total) by mouth daily. 30 tablet 5   montelukast (SINGULAIR) 10 MG tablet Take 1 tablet (10 mg total) by mouth at bedtime. 90 tablet 3   Multiple Vitamin (MULTIVITAMIN) tablet Take 1 tablet by mouth daily.     Omega-3 Fatty Acids (FISH OIL BURP-LESS PO) Take 1 capsule by mouth daily.     omeprazole (PRILOSEC) 20 MG capsule Take 1-2 capsules (20-40 mg total) by mouth daily. 180 capsule 3   polyethylene glycol powder (GLYCOLAX/MIRALAX) 17 GM/SCOOP powder Take 17 g by mouth daily as needed for mild constipation.     Probiotic Product (PROBIOTIC ADVANCED PO) Take by mouth daily.     rosuvastatin (CRESTOR) 5 MG tablet TAKE 1 TABLET EVERY DAY 90 tablet 3   traZODone (DESYREL) 100 MG tablet Take 1 tablet (100 mg total) by mouth at bedtime. 90 tablet 3   No current facility-administered medications for this visit.     Objective:  BP 110/72   Pulse 73   Temp 98.1 F (36.7 C)  Ht '5\' 2"'$  (1.575 m)   Wt 159 lb 3.2 oz (72.2 kg)   SpO2 96%   BMI 29.12 kg/m  Good ROM of neck No suprapubic pain    Assessment and Plan   #Urinary urgency from overactive bladder likely S: possible overactive bladder-she feels sudden urge to go to the bathroom and does not always make it- gradual worsening over years-we will check urinalysis and culture today. Doesn't really happen with coughing with sneezing A/P: this sounds to be overactive bladder with poor control. Check UA and cuture as above to rule out infection. Discussed several options like oxybutynin or vesicare vs myrbetriq- she opted of myrbetriq '25mg'$   ( I think better side effect profile for her) and she will monitor BP at home goal <135/85  #neck tension/stress- ongoing issues for over 6 months- seems to be worsening  slightly- seems to bother her most of the day. has seen chiropractor in the past for popping but not pain. Hast not tried heating pad consistently. No exertional element. No chest pain or shortness of breath. No radiation into arms - refer to sports medicine  Recommended follow up: Return in about 6 months (around 01/29/2022) for followup or sooner if needed.Schedule b4 you leave. Future Appointments  Date Time Provider [place]  04/06/2022  1:30 PM Warren Danes, [place] [facility]  06/13/2022 11:00 AM [facility]   Lab/Order associations:   ICD-10-CM   1. Overactive bladder  N32.81     2. Urinary urgency  R39.15 POCT Urinalysis Dipstick (Automated)    Urine Culture    3. Neck pain  M54.2 Ambulatory referral to Sports Medicine     Meds ordered this encounter  Medications   mirabegron ER (MYRBETRIQ) 25 MG TB24 tablet    Sig: Take 1 tablet (25 mg total) by mouth daily.    Dispense:  30 tablet    Refill:  5    Return precautions advised.  Garret Reddish, MD

## 2021-07-29 NOTE — Assessment & Plan Note (Signed)
#  Urinary urgency from overactive bladder likely S: possible overactive bladder-she feels sudden urge to go to the bathroom and does not always make it- gradual worsening over years-we will check urinalysis and culture today. Doesn't really happen with coughing with sneezing A/P: this sounds to be overactive bladder with poor control. Check UA and cuture as above to rule out infection. Discussed several options like oxybutynin or vesicare vs myrbetriq- she opted of myrbetriq '25mg'$   ( I think better side effect profile for her) and she will monitor BP at home goal <135/85

## 2021-07-29 NOTE — Progress Notes (Signed)
Phone [phone]   Subjective:  Patient presents today for their annual physical. Chief complaint-noted.   See problem oriented charting- ROS- full  review of systems was completed and negative except for: urinary urgency, neck pain and stiffness, seasonal allergies  The following were reviewed and entered/updated in epic: Past Medical History:  Diagnosis Date   Arthritis    Cataracts, bilateral 06/14/2018   GERD (gastroesophageal reflux disease)    Hyperlipidemia    Internal thrombosed hemorrhoids 01/09/2008   Meniere disease    Menopause    Nuclear sclerosis 06/14/2018   Patient Active Problem List   Diagnosis Date Noted   Aortic atherosclerosis ([place]) 08/22/2020    Priority: Medium    Leukopenia 05/29/2014    Priority: Medium    CKD (chronic kidney disease), stage III ([place]) 12/20/2013    Priority: Medium    Meniere disease 12/20/2013    Priority: Medium    Major depression in full remission ([place]) 06/07/2007    Priority: Medium    Hyperlipidemia 10/25/2006    Priority: Medium    Allergic rhinitis 07/18/2017    Priority: Low   Osteopenia 12/03/2014    Priority: Low   Insomnia 05/29/2014    Priority: Low   Alopecia 04/27/2009    Priority: Low   GERD (gastroesophageal reflux disease) 01/09/2008    Priority: Low   Irritable bowel syndrome 01/09/2008    Priority: Low   Viral upper respiratory tract infection 12/14/2020   Hematuria, microscopic 10/08/2018   Cataracts, bilateral 06/14/2018   Nuclear sclerosis 06/14/2018   History reviewed. No pertinent surgical history.  Family History  Problem Relation Age of Onset   Cancer Father        melanoma   Heart disease Father        Died at 61   Diverticulosis Father    COPD Mother    Osteoporosis Mother    Hiatal hernia Mother    Stroke Paternal Grandmother    Heart disease Other        died 95, around age 43-CABG, smoker   GER disease Son    Mental retardation Son    Heart disease Son     Medications-  reviewed and updated Current Outpatient Medications  Medication Sig Dispense Refill   acetaminophen (TYLENOL) 500 MG tablet Take 1,000 mg by mouth daily as needed for mild pain or headache.     albuterol (VENTOLIN HFA) 108 (90 Base) MCG/ACT inhaler Inhale 2 puffs into the lungs every 6 (six) hours as needed for wheezing or shortness of breath. Inhale 1 puff, hold for a few seconds, wait a moment, then inhale 2nd puff. 8 g 0   aspirin EC 81 MG tablet Take 81 mg by mouth daily.     Biotin 5000 MCG CAPS Take 5,000 mcg by mouth daily.     Calcium Carbonate-Vit D-Min (CALCIUM 1200 PO) Take 1 tablet by mouth daily.     desvenlafaxine (PRISTIQ) 50 MG 24 hr tablet Take 1 tablet (50 mg total) by mouth daily. 90 tablet 3   fenofibrate 160 MG tablet Take 1 tablet (160 mg total) by mouth daily. 90 tablet 3   levocetirizine (XYZAL) 5 MG tablet Take 1 tablet (5 mg total) by mouth every evening. 90 tablet 3   Melatonin 5 MG TABS Take 1 tablet by mouth at bedtime.     montelukast (SINGULAIR) 10 MG tablet Take 1 tablet (10 mg total) by mouth at bedtime. 90 tablet 3   Multiple Vitamin (MULTIVITAMIN) tablet  Take 1 tablet by mouth daily.     Omega-3 Fatty Acids (FISH OIL BURP-LESS PO) Take 1 capsule by mouth daily.     omeprazole (PRILOSEC) 20 MG capsule Take 1-2 capsules (20-40 mg total) by mouth daily. 180 capsule 3   polyethylene glycol powder (GLYCOLAX/MIRALAX) 17 GM/SCOOP powder Take 17 g by mouth daily as needed for mild constipation.     Probiotic Product (PROBIOTIC ADVANCED PO) Take by mouth daily.     rosuvastatin (CRESTOR) 5 MG tablet TAKE 1 TABLET EVERY DAY 90 tablet 3   traZODone (DESYREL) 100 MG tablet Take 1 tablet (100 mg total) by mouth at bedtime. 90 tablet 3   No current facility-administered medications for this visit.    Allergies-reviewed and updated Allergies  Allergen Reactions   Erythromycin Swelling   Sulfa Antibiotics    Sulfonamide Derivatives Nausea Only    Social History    Social History Narrative   Married. 2 children (son is special needs-in group home living). 1 grandchild      Retired from [employer] different grades      Hobbies: reading, ipad, former bowling a lot   Objective  Objective:  BP 110/72   Pulse 73   Temp 98.1 F (36.7 C)   Ht '5\' 2"'$  (1.575 m)   Wt 159 lb 3.2 oz (72.2 kg)   SpO2 96%   BMI 29.12 kg/m  Gen: NAD, resting comfortably HEENT: Mucous membranes are moist. Oropharynx normal Neck: no thyromegaly CV: RRR no murmurs rubs or gallops Lungs: CTAB no crackles, wheeze, rhonchi Abdomen: soft/nontender/nondistended/normal bowel sounds. No rebound or guarding.  Ext: no edema Skin: warm, dry Neuro: grossly normal, moves all extremities, PERRLA   Assessment and Plan   71 y.o. female presenting for annual physical.  Health Maintenance counseling: 1. Anticipatory guidance: Patient counseled regarding regular dental exams -q6 months, eye exams - yearly,  avoiding smoking and second hand smoke , limiting alcohol to 1 beverage per day- doesn't drink , no illicit drugs .   2. Risk factor reduction:  Advised patient of need for regular exercise and diet rich and fruits and vegetables to reduce risk of heart attack and stroke.  Exercise- not doing well. Steps are down recently - encouraged to increase especially with low bone density  Diet/weight management-in general reasonably healthy- lately has slipped up some- encouraged more cooking at home.  Weight within 2 pounds of last year/essentially stable Wt Readings from Last 3 Encounters:  07/29/21 159 lb 3.2 oz (72.2 kg)  01/25/21 158 lb 9.6 oz (71.9 kg)  12/14/20 161 lb 12.8 oz (73.4 kg)  3. Immunizations/screenings/ancillary studies-discussed considering COVID-19 vaccination in the fall once needed vaccination is released  Immunization History  Administered Date(s) Administered   Fluad Quad(high Dose 65+) 09/13/2018, 10/28/2019   Influenza Split 11/26/2010,  12/12/2011   Influenza Whole 10/18/2006, 11/10/2008, 11/24/2009   Influenza, High Dose Seasonal PF 11/27/2015   Influenza,inj,Quad PF,6+ Mos 12/12/2012, 10/29/2013, 12/03/2014, 10/31/2020   Influenza-Unspecified 10/22/2016, 09/18/2017   PFIZER(Purple Top)SARS-COV-2 Vaccination 03/02/2019, 03/24/2019, 10/28/2019   Pneumococcal Conjugate-13 12/03/2014   Pneumococcal Polysaccharide-23 12/12/2011, 01/06/2017   Td 01/18/2000   Tdap 11/26/2010   Zoster Recombinat (Shingrix) 09/10/2017, 11/10/2017   Zoster, Live 04/09/2012  4. Cervical cancer screening- still sees gynecology and decisions are made with them on Pap smears-technically past age based screening recommendations 5. Breast cancer screening-  breast exam with gynecology and mammogram 03/26/21 and does yearly 6. Colon cancer screening -February 2019 with 5-year repeat planned  in 2024 7. Skin cancer screening-most recently seen by dermatology in [facility]. advised regular sunscreen use. Denies worrisome, changing, or new skin lesions.  8. Birth control/STD check-postmenopausal and monogamous  9. Osteoporosis screening at 65-see discussion below 10. Smoking associated screening -never smoker  Status of chronic or acute concerns   #hyperlipidemia-coronary artery calcium score of 1 in 2022 #Aortic atherosclerosis S: Medication: Fenofibrate 160 mg, rosuvastatin 5 mg daily Lab Results  Component Value Date   CHOL 197 07/24/2020   HDL 74.00 07/24/2020   LDLCALC 105 (H) 07/24/2020   LDLDIRECT 66.0 01/29/2021   TRIG 90.0 07/24/2020   CHOLHDL 3 07/24/2020    A/P: LDL very well controlled on last check-update full lipid panel today-likely continue current medications.  Aortic atherosclerosis presumed stable on statin and with good LDL control-continue risk factor modification  #Chronic kidney disease stage III S: GFR is typically in the 50s range but has been higher at times -Patient knows to avoid NSAIDs  A/P:    hopefully stable- update CMP today. Continue current meds for now  -PPI not ideal but needed  # GERD S:Medication: Prilosec 20 mg- rarely has to use 2nd A/P:   Controlled. Continue current medications.   -Monitor B12 with labs today-just under 400 last check  # Depression #Insomnia S: Medication: Pristiq 50 mg for depression - intractable hiccups with son in last week has been distressing. Overall feels well - trazodone and melatonin combination for sleep- doing ok lately     07/29/2021    9:35 AM 06/07/2021   11:56 AM 01/25/2021    2:31 PM  Depression screen PHQ 2/9  Decreased Interest 2 0 0  Down, Depressed, Hopeless 2 1 0  PHQ - 2 Score 4 1 0  Altered sleeping 0  2  Tired, decreased energy 1  1  Change in appetite 0  0  Feeling bad or failure about yourself  0  0  Trouble concentrating 0  0  Moving slowly or fidgety/restless 0  0  Suicidal thoughts 0  0  PHQ-9 Score 5  3  Difficult doing work/chores Somewhat difficult  Not difficult at all  A/P: depression with temporary worsening due to sons situation- continue current medicines and hopeful situation improves. 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  Has been on Maxide in the past but ENT later took her off medication.  She no longer sees ENT A/P: ongoing issues- essentially tolerates it    # Low Bone density (formerly osteopenia) S: Last DEXA: 06/11/2020 with Dr. Runell Gess worst T score -1.2  Calcium: '1200mg'$  (through diet ok) recommended - takes this Vitamin D: 1000 units a day recommended- with her calcium -Weightbearing exercise encouraged as above   A/P: hopefully stable- continue to monitor with GYN- get weight bearing exercies and continue calcium/vitamin D   #Isolated leukopenia-intermittently-we will trend CBC and if worsening or other cell lines affected consider further work-up.  Has seen Dr. Irene Limbo in the past in 2018-he thought PPI could be  contributing-LDH, B12, TSH, folate, hepatitis C were normal.  Labs normal on last check-update today Lab Results  Component Value Date   WBC 4.3 01/25/2021   HGB 13.5 01/25/2021   HCT 40.8 01/25/2021   MCV 89.0 01/25/2021   PLT 282.0 01/25/2021   #History hematuria-prior evaluation by Dr. Louis Meckel for gross hematuria-cystoscopy and CT in the past.  We will get a urine with labs annually-urinalysis  ordered today as below  Recommended follow up: Return in about 6 months (around 01/29/2022) for followup or sooner if needed.Schedule b4 you leave. Future Appointments  Date Time Provider [place]  04/06/2022  1:30 PM Warren Danes, [place] [facility]  06/13/2022 11:00 AM [facility]   Lab/Order associations: fasting   ICD-10-CM   1. Encounter for general adult medical examination with abnormal findings  Z00.01     2. Hyperlipidemia, unspecified hyperlipidemia type  E78.5     3. Stage 3 chronic kidney disease, unspecified whether stage 3a or 3b CKD (HCC)  N18.30     4. Aortic atherosclerosis (HCC)  I70.0     5. High risk medication use  Z79.899     6. History of hematuria  Z87.448     7. Urinary urgency  R39.15     8. Neck pain  M54.2       No orders of the defined types were placed in this encounter.   Return precautions advised.  Garret Reddish, MD

## 2021-07-29 NOTE — Patient Instructions (Addendum)
Discussed several options like oxybutynin or vesicare vs myrbetriq- she opted for myrbetriq '25mg'$   ( I think better side effect profile for her) and she will monitor BP at home goal <135/85  Please stop by lab before you go If you have mychart- we will send your results within 3 business days of [place] receiving them.  If you do not have mychart- we will call you about results within 5 business days of [place] receiving them.  *please also note that you will see labs on mychart as soon as they post. I will later go in and write notes on them- will say "notes from Dr. Yong Channel"   Recommended follow up: Return in about 6 months (around 01/29/2022) for followup or sooner if needed.Schedule b4 you leave.

## 2021-07-29 NOTE — Addendum Note (Signed)
Addended by: Marin Olp on: 07/29/2021 10:44 AM   Modules accepted: Orders

## 2021-07-30 LAB — URINE CULTURE
MICRO NUMBER:: 13642868
Result:: NO GROWTH
SPECIMEN QUALITY:: ADEQUATE

## 2021-08-02 ENCOUNTER — Telehealth: Payer: Self-pay | Admitting: Family Medicine

## 2021-08-02 MED ORDER — DESVENLAFAXINE SUCCINATE ER 50 MG PO TB24: 50.0000 mg | ORAL_TABLET | Freq: Every day | ORAL | 3 refills | Status: DC

## 2021-08-02 MED ORDER — FENOFIBRATE 160 MG PO TABS: 160.0000 mg | ORAL_TABLET | Freq: Every day | ORAL | 3 refills | Status: DC

## 2021-08-02 MED ORDER — LEVOCETIRIZINE DIHYDROCHLORIDE 5 MG PO TABS: 5.0000 mg | ORAL_TABLET | Freq: Every evening | ORAL | 3 refills | Status: DC

## 2021-08-02 MED ORDER — MONTELUKAST SODIUM 10 MG PO TABS: 10.0000 mg | ORAL_TABLET | Freq: Every day | ORAL | 3 refills | Status: DC

## 2021-08-02 NOTE — Telephone Encounter (Signed)
Medications sent to pharmacy

## 2021-08-02 NOTE — Telephone Encounter (Signed)
..   Encourage patient to contact the pharmacy for refills or they can request refills through [facility]:  07/29/21  NEXT APPOINTMENT DATE: 01/15/274  MEDICATION: fenofibrate 160 MG tablet  levocetirizine (XYZAL) 5 MG tablet  montelukast (SINGULAIR) 10 MG tablet desvenlafaxine (PRISTIQ) 50 MG 24 hr tablet   Is the patient out of medication? Yes  PHARMACY: [facility], [address]  [place], [place] [address]  Phone:  [phone]  Fax:  [phone]   Let patient know to contact pharmacy at the end of the day to make sure medication is ready.  Please notify patient to allow 48-72 hours to process

## 2021-08-02 NOTE — Telephone Encounter (Signed)
..   Encourage patient to contact the pharmacy for refills or they can request refills through [facility]:  07/29/21  NEXT APPOINTMENT DATE: 01/31/22  MEDICATION: traZODone (DESYREL) 100 MG tablet  Is the patient out of medication? yes  PHARMACY: [facility] [place] [id number]  Let patient know to contact pharmacy at the end of the day to make sure medication is ready.  Patient wanted to know if a 30 day supply of trazodone could be sent to [facility] this [place] due to not having any of the medication and needing to sleep. She would like the other two month supply sent through [facility].

## 2021-08-03 ENCOUNTER — Other Ambulatory Visit: Payer: Self-pay | Admitting: Family Medicine

## 2021-08-03 NOTE — Progress Notes (Unsigned)
Kristin Bonilla D.[place] [facility] [place] Phone: [phone]   Assessment and Plan:     There are no diagnoses linked to this encounter.  ***   Pertinent previous records reviewed include ***   Follow Up: ***     Subjective:   I, Kristin Bonilla, am serving as a Education administrator for Doctor Glennon Mac  Chief Complaint: neck pain   HPI:   08/04/2021 Patient is a 71 year old female complaining of neck pain. Patient states ongoing issues for over 6 months- seems to be worsening slightly- seems to bother her most of the day. has seen chiropractor in the past for popping but not pain. Hast not tried heating pad consistently. No exertional element. No chest pain or shortness of breath. No radiation into arms   Relevant Historical Information: ***  Additional pertinent review of systems negative.   Current Outpatient Medications:    acetaminophen (TYLENOL) 500 MG tablet, Take 1,000 mg by mouth daily as needed for mild pain or headache., Disp: , Rfl:    albuterol (VENTOLIN HFA) 108 (90 Base) MCG/ACT inhaler, Inhale 2 puffs into the lungs every 6 (six) hours as needed for wheezing or shortness of breath. Inhale 1 puff, hold for a few seconds, wait a moment, then inhale 2nd puff., Disp: 8 g, Rfl: 0   aspirin EC 81 MG tablet, Take 81 mg by mouth daily., Disp: , Rfl:    Biotin 5000 MCG CAPS, Take 5,000 mcg by mouth daily., Disp: , Rfl:    Calcium Carbonate-Vit D-Min (CALCIUM 1200 PO), Take 1 tablet by mouth daily., Disp: , Rfl:    desvenlafaxine (PRISTIQ) 50 MG 24 hr tablet, Take 1 tablet (50 mg total) by mouth daily., Disp: 90 tablet, Rfl: 3   fenofibrate 160 MG tablet, Take 1 tablet (160 mg total) by mouth daily., Disp: 90 tablet, Rfl: 3   levocetirizine (XYZAL) 5 MG tablet, Take 1 tablet (5 mg total) by mouth every evening., Disp: 90 tablet, Rfl: 3   Melatonin 5 MG TABS, Take 1 tablet by mouth at bedtime., Disp: , Rfl:     mirabegron ER (MYRBETRIQ) 25 MG TB24 tablet, Take 1 tablet (25 mg total) by mouth daily., Disp: 30 tablet, Rfl: 5   montelukast (SINGULAIR) 10 MG tablet, Take 1 tablet (10 mg total) by mouth at bedtime., Disp: 90 tablet, Rfl: 3   Multiple Vitamin (MULTIVITAMIN) tablet, Take 1 tablet by mouth daily., Disp: , Rfl:    Omega-3 Fatty Acids (FISH OIL BURP-LESS PO), Take 1 capsule by mouth daily., Disp: , Rfl:    omeprazole (PRILOSEC) 20 MG capsule, Take 1-2 capsules (20-40 mg total) by mouth daily., Disp: 180 capsule, Rfl: 3   polyethylene glycol powder (GLYCOLAX/MIRALAX) 17 GM/SCOOP powder, Take 17 g by mouth daily as needed for mild constipation., Disp: , Rfl:    Probiotic Product (PROBIOTIC ADVANCED PO), Take by mouth daily., Disp: , Rfl:    rosuvastatin (CRESTOR) 5 MG tablet, TAKE 1 TABLET EVERY DAY, Disp: 90 tablet, Rfl: 3   traZODone (DESYREL) 100 MG tablet, Take 1 tablet (100 mg total) by mouth at bedtime., Disp: 90 tablet, Rfl: 3   Objective:     There were no vitals filed for this visit.    There is no height or weight on file to calculate BMI.    Physical Exam:    ***   Electronically signed by:  Kristin Bonilla D.[name] 7:37 AM  08/03/21 

## 2021-08-04 ENCOUNTER — Ambulatory Visit: Payer: Medicare PPO | Admitting: Sports Medicine

## 2021-08-04 ENCOUNTER — Ambulatory Visit (INDEPENDENT_AMBULATORY_CARE_PROVIDER_SITE_OTHER): Payer: Medicare PPO

## 2021-08-04 VITALS — BP 126/82 | HR 82 | Ht 62.0 in | Wt 159.0 lb

## 2021-08-04 DIAGNOSIS — M5030 Other cervical disc degeneration, unspecified cervical region: Secondary | ICD-10-CM

## 2021-08-04 DIAGNOSIS — M542 Cervicalgia: Secondary | ICD-10-CM

## 2021-08-04 MED ORDER — OMEPRAZOLE 20 MG PO CPDR: 20.0000 mg | DELAYED_RELEASE_CAPSULE | Freq: Every day | ORAL | 3 refills | Status: DC

## 2021-08-04 NOTE — Addendum Note (Signed)
Addended by: Clyde Lundborg A on: 08/04/2021 10:32 AM   Modules accepted: Orders

## 2021-08-04 NOTE — Patient Instructions (Addendum)
Good to see you  Neck HEP Tylenol [phone] mg 2-3 times a day for pain relief  Pt referral  4 week follow up

## 2021-08-04 NOTE — Telephone Encounter (Signed)
Rx sent to [facility].

## 2021-08-04 NOTE — Telephone Encounter (Signed)
..   Encourage patient to contact the pharmacy for refills or they can request refills through [facility]:  07/29/21  NEXT APPOINTMENT DATE: 01/31/22  MEDICATION:omeprazole (PRILOSEC) 20 MG capsule  Is the patient out of medication?   PHARMACY: [facility], [place] Phone:  [phone]  Fax:  [phone]      Let patient know to contact pharmacy at the end of the day to make sure medication is ready.  Please notify patient to allow 48-72 hours to process

## 2021-08-06 ENCOUNTER — Telehealth: Payer: Self-pay | Admitting: Sports Medicine

## 2021-08-06 NOTE — Telephone Encounter (Signed)
Called and spoke with patient.

## 2021-08-06 NOTE — Telephone Encounter (Signed)
Pt has done exercises we provided for two days. Feels like her neck pain is much worse, can hardly turn neck to the R without pain running into her head.  Unsure if she should feel worse before she feels better or if she is doing something wrong.  Please call today if possible.

## 2021-08-12 DIAGNOSIS — M542 Cervicalgia: Secondary | ICD-10-CM | POA: Diagnosis not present

## 2021-08-16 DIAGNOSIS — M542 Cervicalgia: Secondary | ICD-10-CM | POA: Diagnosis not present

## 2021-08-19 ENCOUNTER — Encounter: Payer: Self-pay | Admitting: Family Medicine

## 2021-08-19 ENCOUNTER — Other Ambulatory Visit: Payer: Self-pay

## 2021-08-19 DIAGNOSIS — M542 Cervicalgia: Secondary | ICD-10-CM | POA: Diagnosis not present

## 2021-08-19 MED ORDER — MIRABEGRON ER 25 MG PO TB24: 25.0000 mg | ORAL_TABLET | Freq: Every day | ORAL | 3 refills | Status: DC

## 2021-08-24 DIAGNOSIS — M542 Cervicalgia: Secondary | ICD-10-CM | POA: Diagnosis not present

## 2021-08-24 NOTE — Progress Notes (Signed)
Kristin Bonilla Phone: [phone]   Assessment and Plan:     1. Neck pain 2. DDD (degenerative disc disease), cervical  -Chronic with exacerbation, subsequent visit - Overall improvement in exacerbation of chronic neck pain with regular Tylenol use, HEP and physical therapy - Patient has completed physical therapy and can continue HEP -Continue Tylenol 500 to 1000 mg tablets 2-3 times a day for day-to-day pain relief - With PMH kidney disease, recommend limiting/eliminating use of NSAIDs  Pertinent previous records reviewed include none   Follow Up: As needed if no improvement or worsening of symptoms.   Subjective:   I, Kristin Bonilla, am serving as a Education administrator for Kristin Bonilla   Chief Complaint: neck pain    HPI:    08/04/2021 Patient is a 71 year old female complaining of neck pain. Patient states ongoing issues for over 6 months- seems to be worsening slightly- seems to bother her most of the day. has seen chiropractor in the past for popping but not pain. Hast not tried heating pad consistently. No exertional element. No chest pain or shortness of breath. No radiation into but radiates to the top of the head , has been taking tylenol for the pain but its only short term relief , head feels to heavy for her neck , aches around her ears,   09/01/2021 Patient states that she is better than she was when she first came in     Relevant Historical Information: GERD, CKD 3  Additional pertinent review of systems negative.   Current Outpatient Medications:    acetaminophen (TYLENOL) 500 MG tablet, Take 1,000 mg by mouth daily as needed for mild pain or headache., Disp: , Rfl:    albuterol (VENTOLIN HFA) 108 (90 Base) MCG/ACT inhaler, Inhale 2 puffs into the lungs every 6 (six) hours as needed for wheezing or shortness of breath. Inhale 1 puff, hold for a few seconds, wait a moment, then  inhale 2nd puff., Disp: 8 g, Rfl: 0   aspirin EC 81 MG tablet, Take 81 mg by mouth daily., Disp: , Rfl:    Biotin 5000 MCG CAPS, Take 5,000 mcg by mouth daily., Disp: , Rfl:    Calcium Carbonate-Vit D-Min (CALCIUM 1200 PO), Take 1 tablet by mouth daily., Disp: , Rfl:    desvenlafaxine (PRISTIQ) 50 MG 24 hr tablet, Take 1 tablet (50 mg total) by mouth daily., Disp: 90 tablet, Rfl: 3   fenofibrate 160 MG tablet, Take 1 tablet (160 mg total) by mouth daily., Disp: 90 tablet, Rfl: 3   levocetirizine (XYZAL) 5 MG tablet, Take 1 tablet (5 mg total) by mouth every evening., Disp: 90 tablet, Rfl: 3   Melatonin 5 MG TABS, Take 1 tablet by mouth at bedtime., Disp: , Rfl:    mirabegron ER (MYRBETRIQ) 25 MG TB24 tablet, Take 1 tablet (25 mg total) by mouth daily., Disp: 90 tablet, Rfl: 3   montelukast (SINGULAIR) 10 MG tablet, Take 1 tablet (10 mg total) by mouth at bedtime., Disp: 90 tablet, Rfl: 3   Multiple Vitamin (MULTIVITAMIN) tablet, Take 1 tablet by mouth daily., Disp: , Rfl:    Omega-3 Fatty Acids (FISH OIL BURP-LESS PO), Take 1 capsule by mouth daily., Disp: , Rfl:    omeprazole (PRILOSEC) 20 MG capsule, Take 1-2 capsules (20-40 mg total) by mouth daily., Disp: 180 capsule, Rfl: 3   polyethylene glycol powder (GLYCOLAX/MIRALAX) 17 GM/SCOOP powder, Take 17  g by mouth daily as needed for mild constipation., Disp: , Rfl:    Probiotic Product (PROBIOTIC ADVANCED PO), Take by mouth daily., Disp: , Rfl:    rosuvastatin (CRESTOR) 5 MG tablet, TAKE 1 TABLET EVERY DAY, Disp: 90 tablet, Rfl: 3   traZODone (DESYREL) 100 MG tablet, TAKE 1 TABLET AT BEDTIME, Disp: 90 tablet, Rfl: 3   Objective:     Vitals:   09/01/21 0951  BP: 124/84  Pulse: 84  SpO2: 98%  Weight: 160 lb (72.6 kg)  Height: '5\' 2"'$  (1.575 m)      Body mass index is 29.26 kg/m.    Physical Exam:    Cervical Spine: Posture normal Skin: normal, intact  Neurological:   Strength:  Right  Left   Deltoid 5/5 5/5  Bicep 5/5  5/5   Tricep 5/5 5/5  Wrist Flexion 5/5 5/5  Wrist Extension 5/5 5/5  Grip 5/5 5/5  Finger Abduction 5/5 5/5   Sensation: intact to light touch in upper extremities bilaterally  Spurling's:  negative bilaterally Neck ROM: Full active ROM TTP: Mildly bilateral cervical paraspinal NTTP: cervical spinous processes,  thoracic paraspinal, trapezius    Electronically signed by:  Kristin Bonilla D.[name] 10:18 AM 09/01/21

## 2021-08-27 DIAGNOSIS — M542 Cervicalgia: Secondary | ICD-10-CM | POA: Diagnosis not present

## 2021-08-31 DIAGNOSIS — M542 Cervicalgia: Secondary | ICD-10-CM | POA: Diagnosis not present

## 2021-09-01 ENCOUNTER — Ambulatory Visit: Payer: Medicare PPO | Admitting: Sports Medicine

## 2021-09-01 VITALS — BP 124/84 | HR 84 | Ht 62.0 in | Wt 160.0 lb

## 2021-09-01 DIAGNOSIS — M542 Cervicalgia: Secondary | ICD-10-CM

## 2021-09-01 DIAGNOSIS — M5030 Other cervical disc degeneration, unspecified cervical region: Secondary | ICD-10-CM

## 2021-09-01 NOTE — Patient Instructions (Addendum)
Good to see you  Neck HEP As needed follow up

## 2021-10-08 DIAGNOSIS — H8101 Meniere's disease, right ear: Secondary | ICD-10-CM | POA: Diagnosis not present

## 2021-10-08 DIAGNOSIS — H9041 Sensorineural hearing loss, unilateral, right ear, with unrestricted hearing on the contralateral side: Secondary | ICD-10-CM | POA: Diagnosis not present

## 2021-10-11 ENCOUNTER — Encounter: Payer: Self-pay | Admitting: *Deleted

## 2021-10-19 ENCOUNTER — Encounter: Payer: Self-pay | Admitting: Family Medicine

## 2021-11-22 DIAGNOSIS — H9041 Sensorineural hearing loss, unilateral, right ear, with unrestricted hearing on the contralateral side: Secondary | ICD-10-CM | POA: Diagnosis not present

## 2022-01-24 ENCOUNTER — Encounter: Payer: Self-pay | Admitting: Family Medicine

## 2022-01-24 ENCOUNTER — Telehealth (INDEPENDENT_AMBULATORY_CARE_PROVIDER_SITE_OTHER): Payer: Medicare HMO | Admitting: Family Medicine

## 2022-01-24 ENCOUNTER — Telehealth: Payer: Medicare HMO | Admitting: Internal Medicine

## 2022-01-24 ENCOUNTER — Telehealth: Payer: Medicare HMO | Admitting: Family Medicine

## 2022-01-24 VITALS — Temp 100.0°F | Ht 62.0 in | Wt 159.0 lb

## 2022-01-24 DIAGNOSIS — U071 COVID-19: Secondary | ICD-10-CM | POA: Diagnosis not present

## 2022-01-24 MED ORDER — NIRMATRELVIR/RITONAVIR (PAXLOVID)TABLET: 3.0000 | ORAL_TABLET | Freq: Two times a day (BID) | ORAL | 0 refills | Status: AC

## 2022-01-24 NOTE — Progress Notes (Signed)
Virtual Visit via Video Note  Subjective  CC:  Chief Complaint  Patient presents with   Cough    Pt stated that her husband tested positive on 01/22/2022 but she is having all the symptoms that he is having as well. Her test did not come up positive just has symptoms.      I connected with Nadara Eaton on 01/24/22 at  8:30 AM EST by a video enabled telemedicine application and verified that I am speaking with the correct person using two identifiers. Location patient: Home Location provider: [facility] [place], Office Persons participating in the virtual visit: Kristin Bonilla, Leamon Arnt, MD Darlina Rumpf CMA  Same day acute visit; PCP not available. New pt to me. Chart reviewed.   I discussed the limitations of evaluation and management by telemedicine and the availability of in person appointments. The patient expressed understanding and agreed to proceed. HPI: Kristin Bonilla is a 72 y.o. female who was contacted today to address the problems listed above in the chief complaint. 72 yo female with depression, hyperlipidemia, and insomnia c/o covid sxs x 24-48 hours: malaise, low grade fevers, cough, headache and fatigue. Has h/o bronchospasm and has used her albuterol inhaler with relief. No sob or chest pain. No GI sxs. Husband has same. No abdominal pain. Has had 3 covid vaccinations since beginning of epidemic.   Assessment  1. COVID      Plan  covid:  discussed treatment options: mucinex dm, advil, rest, nutrition and plenty of fluids. To start paxlovid: risks/benefits discussed. Rec isolation x 5 days. Monitor breathing, may continue albuterol as needed.  I discussed the assessment and treatment plan with the patient. The patient was provided an opportunity to ask questions and all were answered. The patient agreed with the plan and demonstrated an understanding of the instructions.   The patient was advised to call back or seek an in-person  evaluation if the symptoms worsen or if the condition fails to improve as anticipated. Follow up: as scheduled  01/31/2022  Meds ordered this encounter  Medications   nirmatrelvir/ritonavir (PAXLOVID) 20 x 150 MG & 10 x '100MG'$  TABS    Sig: Take 3 tablets by mouth 2 (two) times daily for 5 days. (Take nirmatrelvir 150 mg two tablets twice daily for 5 days and ritonavir 100 mg one tablet twice daily for 5 days) Patient GFR is 60    Dispense:  30 tablet    Refill:  0      I reviewed the patients updated PMH, FH, and SocHx.    Patient Active Problem List   Diagnosis Date Noted   Overactive bladder 07/29/2021   Viral upper respiratory tract infection 12/14/2020   Aortic atherosclerosis ([place]) 08/22/2020   Hematuria, microscopic 10/08/2018   Cataracts, bilateral 06/14/2018   Nuclear sclerosis 06/14/2018   Allergic rhinitis 07/18/2017   Osteopenia 12/03/2014   Leukopenia 05/29/2014   Insomnia 05/29/2014   CKD (chronic kidney disease), stage III ([place]) 12/20/2013   Meniere disease 12/20/2013   Alopecia 04/27/2009   GERD (gastroesophageal reflux disease) 01/09/2008   Irritable bowel syndrome 01/09/2008   Major depression in full remission ([place]) 06/07/2007   Hyperlipidemia 10/25/2006   Current Meds  Medication Sig   acetaminophen (TYLENOL) 500 MG tablet Take 1,000 mg by mouth daily as needed for mild pain or headache.   albuterol (VENTOLIN HFA) 108 (90 Base) MCG/ACT inhaler Inhale 2 puffs into the lungs every 6 (  six) hours as needed for wheezing or shortness of breath. Inhale 1 puff, hold for a few seconds, wait a moment, then inhale 2nd puff.   aspirin EC 81 MG tablet Take 81 mg by mouth daily.   Biotin 5000 MCG CAPS Take 5,000 mcg by mouth daily.   Calcium Carbonate-Vit D-Min (CALCIUM 1200 PO) Take 1 tablet by mouth daily.   desvenlafaxine (PRISTIQ) 50 MG 24 hr tablet Take 1 tablet (50 mg total) by mouth daily.   fenofibrate 160 MG tablet Take 1 tablet (160 mg total) by mouth daily.    levocetirizine (XYZAL) 5 MG tablet Take 1 tablet (5 mg total) by mouth every evening.   Melatonin 5 MG TABS Take 1 tablet by mouth at bedtime.   mirabegron ER (MYRBETRIQ) 25 MG TB24 tablet Take 1 tablet (25 mg total) by mouth daily.   montelukast (SINGULAIR) 10 MG tablet Take 1 tablet (10 mg total) by mouth at bedtime.   Multiple Vitamin (MULTIVITAMIN) tablet Take 1 tablet by mouth daily.   nirmatrelvir/ritonavir (PAXLOVID) 20 x 150 MG & 10 x '100MG'$  TABS Take 3 tablets by mouth 2 (two) times daily for 5 days. (Take nirmatrelvir 150 mg two tablets twice daily for 5 days and ritonavir 100 mg one tablet twice daily for 5 days) Patient GFR is 60   Omega-3 Fatty Acids (FISH OIL BURP-LESS PO) Take 1 capsule by mouth daily.   omeprazole (PRILOSEC) 20 MG capsule Take 1-2 capsules (20-40 mg total) by mouth daily.   polyethylene glycol powder (GLYCOLAX/MIRALAX) 17 GM/SCOOP powder Take 17 g by mouth daily as needed for mild constipation.   Probiotic Product (PROBIOTIC ADVANCED PO) Take by mouth daily.   rosuvastatin (CRESTOR) 5 MG tablet TAKE 1 TABLET EVERY DAY   traZODone (DESYREL) 100 MG tablet TAKE 1 TABLET AT BEDTIME    Allergies: Patient is allergic to erythromycin, sulfa antibiotics, and sulfonamide derivatives. Family History: Patient family history includes COPD in her mother; Cancer in her father; Diverticulosis in her father; GER disease in her son; Heart disease in her father, son, and another family member; Hiatal hernia in her mother; Mental retardation in her son; Osteoporosis in her mother; Stroke in her paternal grandmother. Social History:  Patient  reports that she has never smoked. She has never used smokeless tobacco. She reports that she does not drink alcohol and does not use drugs.  Review of Systems: Constitutional: +for fever malaise or anorexia Cardiovascular: negative for chest pain Respiratory: negative for SOB  Gastrointestinal: negative for abdominal  pain  OBJECTIVE Vitals: Temp 100 F (37.8 C)   Ht '5\' 2"'$  (1.575 m)   Wt 159 lb (72.1 kg)   BMI 29.08 kg/m  General: no acute distress , A&Ox3, no respiratory distress Speaking clearly  Leamon Arnt, MD

## 2022-01-31 ENCOUNTER — Encounter: Payer: Self-pay | Admitting: Family Medicine

## 2022-01-31 ENCOUNTER — Ambulatory Visit (INDEPENDENT_AMBULATORY_CARE_PROVIDER_SITE_OTHER): Payer: Medicare HMO | Admitting: Family Medicine

## 2022-01-31 VITALS — BP 104/71 | HR 93 | Temp 97.9°F | Ht 62.0 in | Wt 156.8 lb

## 2022-01-31 DIAGNOSIS — N183 Chronic kidney disease, stage 3 unspecified: Secondary | ICD-10-CM | POA: Diagnosis not present

## 2022-01-31 DIAGNOSIS — F325 Major depressive disorder, single episode, in full remission: Secondary | ICD-10-CM | POA: Diagnosis not present

## 2022-01-31 DIAGNOSIS — E785 Hyperlipidemia, unspecified: Secondary | ICD-10-CM | POA: Diagnosis not present

## 2022-01-31 DIAGNOSIS — I700 Atherosclerosis of aorta: Secondary | ICD-10-CM

## 2022-01-31 LAB — CBC WITH DIFFERENTIAL/PLATELET
Basophils Absolute: 0 10*3/uL (ref 0.0–0.1)
Basophils Relative: 1.3 % (ref 0.0–3.0)
Eosinophils Absolute: 0.1 10*3/uL (ref 0.0–0.7)
Eosinophils Relative: 1.7 % (ref 0.0–5.0)
HCT: 41.8 % (ref 36.0–46.0)
Hemoglobin: 14 g/dL (ref 12.0–15.0)
Lymphocytes Relative: 47.5 % — ABNORMAL HIGH (ref 12.0–46.0)
Lymphs Abs: 1.5 10*3/uL (ref 0.7–4.0)
MCHC: 33.5 g/dL (ref 30.0–36.0)
MCV: 88.6 fl (ref 78.0–100.0)
Monocytes Absolute: 0.3 10*3/uL (ref 0.1–1.0)
Monocytes Relative: 9.6 % (ref 3.0–12.0)
Neutro Abs: 1.3 10*3/uL — ABNORMAL LOW (ref 1.4–7.7)
Neutrophils Relative %: 39.9 % — ABNORMAL LOW (ref 43.0–77.0)
Platelets: 277 10*3/uL (ref 150.0–400.0)
RBC: 4.72 Mil/uL (ref 3.87–5.11)
RDW: 13.4 % (ref 11.5–15.5)
WBC: 3.2 10*3/uL — ABNORMAL LOW (ref 4.0–10.5)

## 2022-01-31 LAB — COMPREHENSIVE METABOLIC PANEL
ALT: 16 U/L (ref 0–35)
AST: 21 U/L (ref 0–37)
Albumin: 4.6 g/dL (ref 3.5–5.2)
Alkaline Phosphatase: 54 U/L (ref 39–117)
BUN: 20 mg/dL (ref 6–23)
CO2: 28 mEq/L (ref 19–32)
Calcium: 10.6 mg/dL — ABNORMAL HIGH (ref 8.4–10.5)
Chloride: 101 mEq/L (ref 96–112)
Creatinine, Ser: 1.15 mg/dL (ref 0.40–1.20)
GFR: 47.57 mL/min — ABNORMAL LOW (ref >60.00)
Glucose, Bld: 102 mg/dL — ABNORMAL HIGH (ref 70–99)
Potassium: 3.5 mEq/L (ref 3.5–5.1)
Sodium: 142 mEq/L (ref 135–145)
Total Bilirubin: 0.5 mg/dL (ref 0.2–1.2)
Total Protein: 7 g/dL (ref 6.0–8.3)

## 2022-01-31 NOTE — Patient Instructions (Addendum)
Please stop by lab before you go If you have mychart- we will send your results within 3 business days of [place] receiving them.  If you do not have mychart- we will call you about results within 5 business days of [place] receiving them.  *please also note that you will see labs on mychart as soon as they post. I will later go in and write notes on them- will say "notes from Dr. Yong Channel"   Recommended follow up: Return in about 6 months (around 08/01/2022) for physical or sooner if needed.Schedule b4 you leave.

## 2022-01-31 NOTE — Progress Notes (Signed)
Phone [phone] In person visit   Subjective:   Kristin Bonilla is a 72 y.o. year old very pleasant female patient who presents for/with See problem oriented charting Chief Complaint  Patient presents with   Follow-up    Pt has no questions or concerns    Hyperlipidemia    Past Medical History-  Patient Active Problem List   Diagnosis Date Noted   Overactive bladder 07/29/2021    Priority: Medium    Aortic atherosclerosis ([place]) 08/22/2020    Priority: Medium    Leukopenia 05/29/2014    Priority: Medium    CKD (chronic kidney disease), stage III ([place]) 12/20/2013    Priority: Medium    Meniere disease 12/20/2013    Priority: Medium    Major depression in full remission ([place]) 06/07/2007    Priority: Medium    Hyperlipidemia 10/25/2006    Priority: Medium    Allergic rhinitis 07/18/2017    Priority: Low   Osteopenia 12/03/2014    Priority: Low   Insomnia 05/29/2014    Priority: Low   Alopecia 04/27/2009    Priority: Low   GERD (gastroesophageal reflux disease) 01/09/2008    Priority: Low   Irritable bowel syndrome 01/09/2008    Priority: Low   Viral upper respiratory tract infection 12/14/2020   Hematuria, microscopic 10/08/2018   Cataracts, bilateral 06/14/2018   Nuclear sclerosis 06/14/2018    Medications- reviewed and updated Current Outpatient Medications  Medication Sig Dispense Refill   acetaminophen (TYLENOL) 500 MG tablet Take 1,000 mg by mouth daily as needed for mild pain or headache.     albuterol (VENTOLIN HFA) 108 (90 Base) MCG/ACT inhaler Inhale 2 puffs into the lungs every 6 (six) hours as needed for wheezing or shortness of breath. Inhale 1 puff, hold for a few seconds, wait a moment, then inhale 2nd puff. 8 g 0   aspirin EC 81 MG tablet Take 81 mg by mouth daily.     Biotin 5000 MCG CAPS Take 5,000 mcg by mouth daily.     Calcium Carbonate-Vit D-Min (CALCIUM 1200 PO) Take 1 tablet by mouth daily.     desvenlafaxine (PRISTIQ) 50 MG 24 hr tablet  Take 1 tablet (50 mg total) by mouth daily. 90 tablet 3   fenofibrate 160 MG tablet Take 1 tablet (160 mg total) by mouth daily. 90 tablet 3   levocetirizine (XYZAL) 5 MG tablet Take 1 tablet (5 mg total) by mouth every evening. 90 tablet 3   Melatonin 5 MG TABS Take 1 tablet by mouth at bedtime.     mirabegron ER (MYRBETRIQ) 25 MG TB24 tablet Take 1 tablet (25 mg total) by mouth daily. 90 tablet 3   montelukast (SINGULAIR) 10 MG tablet Take 1 tablet (10 mg total) by mouth at bedtime. 90 tablet 3   Multiple Vitamin (MULTIVITAMIN) tablet Take 1 tablet by mouth daily.     Omega-3 Fatty Acids (FISH OIL BURP-LESS PO) Take 1 capsule by mouth daily.     omeprazole (PRILOSEC) 20 MG capsule Take 1-2 capsules (20-40 mg total) by mouth daily. 180 capsule 3   polyethylene glycol powder (GLYCOLAX/MIRALAX) 17 GM/SCOOP powder Take 17 g by mouth daily as needed for mild constipation.     Probiotic Product (PROBIOTIC ADVANCED PO) Take by mouth daily.     rosuvastatin (CRESTOR) 5 MG tablet TAKE 1 TABLET EVERY DAY 90 tablet 3   traZODone (DESYREL) 100 MG tablet TAKE 1 TABLET AT BEDTIME 90 tablet 3   No current facility-administered  medications for this visit.     Objective:  BP 104/71 (BP Location: Right Arm, Patient Position: Sitting)   Pulse 93   Temp 97.9 F (36.6 C) (Temporal)   Ht '5\' 2"'$  (1.575 m)   Wt 156 lb 12.8 oz (71.1 kg)   SpO2 98%   BMI 28.68 kg/m  Gen: NAD, resting comfortably CV: RRR no murmurs rubs or gallops Lungs: CTAB no crackles, wheeze, rhonchi Ext: no edema Skin: warm, dry    Assessment and Plan   #Covid diagnosed January 8th- she is improving steadily- lingering cough and fatigue. No fever or shortness of breath. Her first symptoms were January 7th.   #Neck Pain/DDD- saw Dr. Glennon Mac back in August. Did physical therapy and improved range of motion but still dealing with fair amount of pain. She may see him back or chiropractor to see if can get further improvedment. 6 months  of symptoms at this point.   #hyperlipidemia-coronary artery calcium score of 1 in 2022 #Aortic atherosclerosis S: Medication: Fenofibrate 160 mg, rosuvastatin 5 mg daily  - going to [facility] with sister- outside of covid and life issues- hoping to get going again Lab Results  Component Value Date   CHOL 166 07/29/2021   HDL 82.10 07/29/2021   LDLCALC 69 07/29/2021   LDLDIRECT 66.0 01/29/2021   TRIG 73.0 07/29/2021   CHOLHDL 2 07/29/2021    A/P: doing very well- continue current medications for lipids. For aortic atherosclerosis LDL goal is under 70 and she is under that! Presumed stable  #Chronic kidney disease stage III S: GFR is typically in the 50s range but has been higher at times-right at 60 last visit -Patient knows to avoid NSAIDs  A/P: hopefully stable- update cmp today.   # GERD S:Medication: Prilosec 20 mg- rarely has to use 2nd A/P: Controlled. Continue current medications.  # Depression #Insomnia S: Medication: Pristiq 50 mg for depression, trazodone '100mg'$  and melatonin combination for sleep in the past     01/31/2022    9:06 AM 01/24/2022    8:31 AM 07/29/2021    9:35 AM  Depression screen PHQ 2/9  Decreased Interest 0 0 2  Down, Depressed, Hopeless 0 0 2  PHQ - 2 Score 0 0 4  Altered sleeping 1  0  Tired, decreased energy 1  1  Change in appetite 1  0  Feeling bad or failure about yourself  0  0  Trouble concentrating 0  0  Moving slowly or fidgety/restless 0  0  Suicidal thoughts 0  0  PHQ-9 Score 3  5  Difficult doing work/chores Not difficult at all  Somewhat difficult  A/P: full remission- continue current medications   #Mnire's disease S: 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 Slightly worse with covid recently.  A/P: overall stable- continue without meds    #Isolated leukopenia-intermittently-we will trend CBC and if worsening or other cell lines affected consider further work-up.  Has seen Dr. Irene Limbo in the past in 2018-he  thought PPI could be contributing-LDH, B12, TSH, folate, hepatitis C were normal Lab Results  Component Value Date   WBC 3.7 (L) 07/29/2021   HGB 13.4 07/29/2021   HCT 40.4 07/29/2021   MCV 91.2 07/29/2021   PLT 255.0 07/29/2021    #OAB 07/29/20- myrbetriq helpful- continue  Recommended follow up: Return in about 6 months (around 08/01/2022) for physical or sooner if needed.Schedule b4 you leave. Future Appointments  Date Time Provider [place]  06/13/2022 11:00  AM [facility]    Lab/Order associations:   ICD-10-CM   1. Hyperlipidemia, unspecified hyperlipidemia type  E78.5     2. Stage 3 chronic kidney disease, unspecified whether stage 3a or 3b CKD (HCC)  N18.30     3. Aortic atherosclerosis (HCC)  I70.0     4. Major depressive disorder with single episode, in full remission ([place])  F32.5       No orders of the defined types were placed in this encounter.   Return precautions advised.  Garret Reddish, MD

## 2022-02-02 DIAGNOSIS — C44319 Basal cell carcinoma of skin of other parts of face: Secondary | ICD-10-CM | POA: Diagnosis not present

## 2022-02-02 DIAGNOSIS — Z85828 Personal history of other malignant neoplasm of skin: Secondary | ICD-10-CM | POA: Diagnosis not present

## 2022-02-02 DIAGNOSIS — D225 Melanocytic nevi of trunk: Secondary | ICD-10-CM | POA: Diagnosis not present

## 2022-02-02 DIAGNOSIS — L570 Actinic keratosis: Secondary | ICD-10-CM | POA: Diagnosis not present

## 2022-02-02 DIAGNOSIS — L821 Other seborrheic keratosis: Secondary | ICD-10-CM | POA: Diagnosis not present

## 2022-03-08 DIAGNOSIS — C44319 Basal cell carcinoma of skin of other parts of face: Secondary | ICD-10-CM | POA: Diagnosis not present

## 2022-03-08 DIAGNOSIS — Z85828 Personal history of other malignant neoplasm of skin: Secondary | ICD-10-CM | POA: Diagnosis not present

## 2022-03-14 DIAGNOSIS — Z4802 Encounter for removal of sutures: Secondary | ICD-10-CM | POA: Diagnosis not present

## 2022-03-14 DIAGNOSIS — L570 Actinic keratosis: Secondary | ICD-10-CM | POA: Diagnosis not present

## 2022-03-17 DIAGNOSIS — Z1211 Encounter for screening for malignant neoplasm of colon: Secondary | ICD-10-CM | POA: Diagnosis not present

## 2022-03-17 DIAGNOSIS — D120 Benign neoplasm of cecum: Secondary | ICD-10-CM | POA: Diagnosis not present

## 2022-03-17 DIAGNOSIS — D128 Benign neoplasm of rectum: Secondary | ICD-10-CM | POA: Diagnosis not present

## 2022-03-17 DIAGNOSIS — Z86010 Personal history of colonic polyps: Secondary | ICD-10-CM | POA: Diagnosis not present

## 2022-03-17 DIAGNOSIS — Z09 Encounter for follow-up examination after completed treatment for conditions other than malignant neoplasm: Secondary | ICD-10-CM | POA: Diagnosis not present

## 2022-03-17 LAB — HM COLONOSCOPY

## 2022-03-21 ENCOUNTER — Encounter: Payer: Self-pay | Admitting: Family Medicine

## 2022-03-21 IMAGING — CT CT CARDIAC CORONARY ARTERY CALCIUM SCORE
3 series · 14 of 20 positions shown, 15 images · non-contrast
Comparison: None.
COMPARISON: None.

Addendum:
EXAM:
OVER-READ INTERPRETATION  CT CHEST

The following report is an over-read performed by radiologist Dr.
Le Lucena [REDACTED] on 08/21/2020. This
over-read does not include interpretation of cardiac or coronary
anatomy or pathology. The coronary calcium score interpretation by
the cardiologist is attached.
CLINICAL DATA: Risk stratification: 70 Year-old White Female
Coronary Calcium Score
TECHNIQUE: The patient was scanned on a Siemens Force scanner. Axial
non-contrast 3 mm slices were carried out through the heart. The
data set was analyzed on a dedicated work station and scored using
the Agatson method.

[Series 2: casc 3.0 bv41 2 bestdiast 70 % · axial · 0.33mm/px · z∈[+1339,+1426]mm · 4 of 49 slices shown, 5 images]
[im 10/49  vessel]
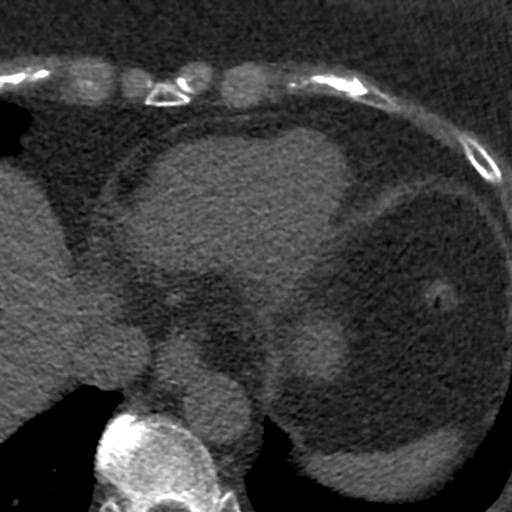
[im 10/49  lung]
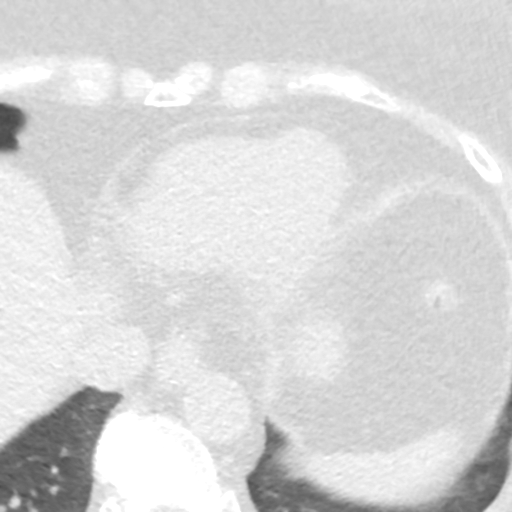
[im 20/49  vessel]
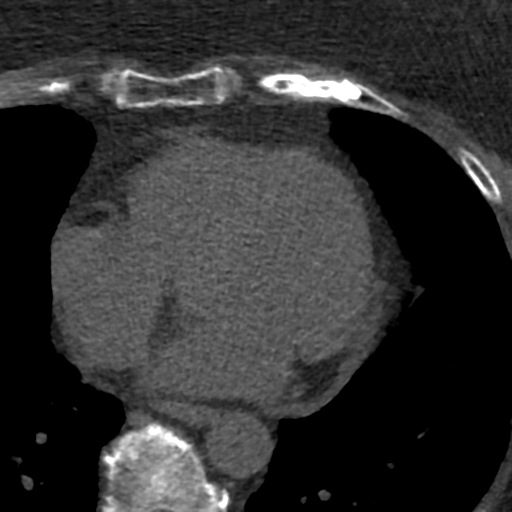
[im 29/49  vessel]
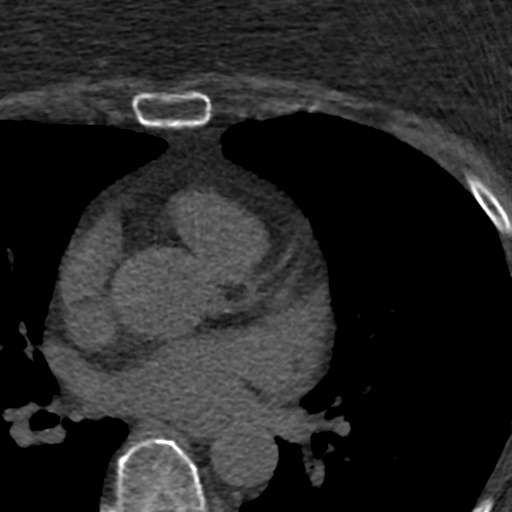
[im 39/49  vessel]
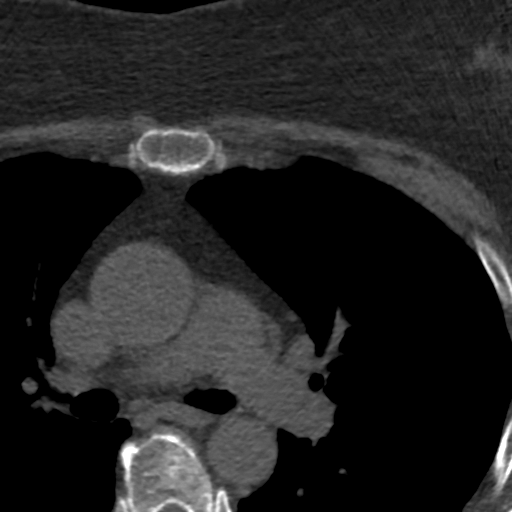

[Series 3: lung 71 % · axial · 0.68mm/px · z∈[+1336,+1432]mm · 5 of 49 slices shown]
[im 9/49  lung]
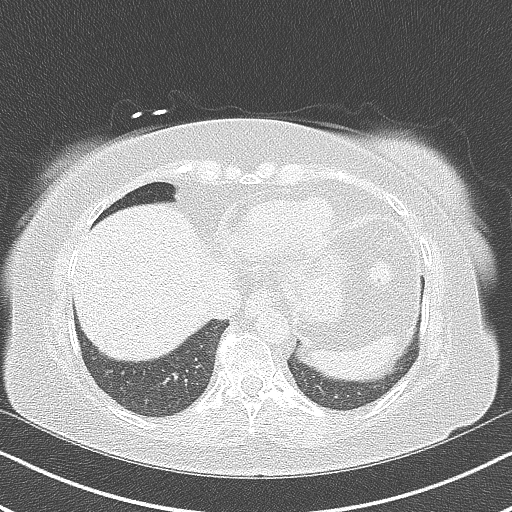
[im 17/49  lung]
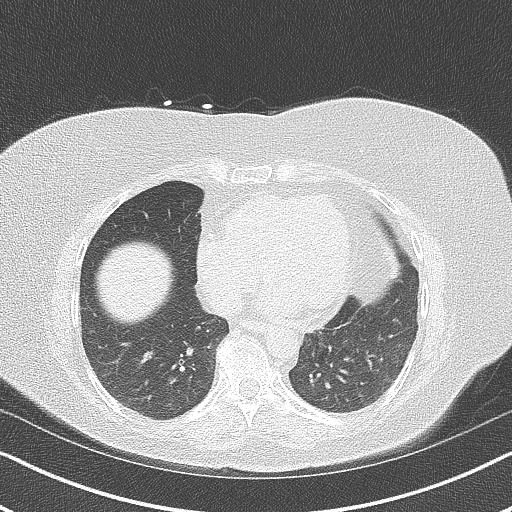
[im 25/49  lung]
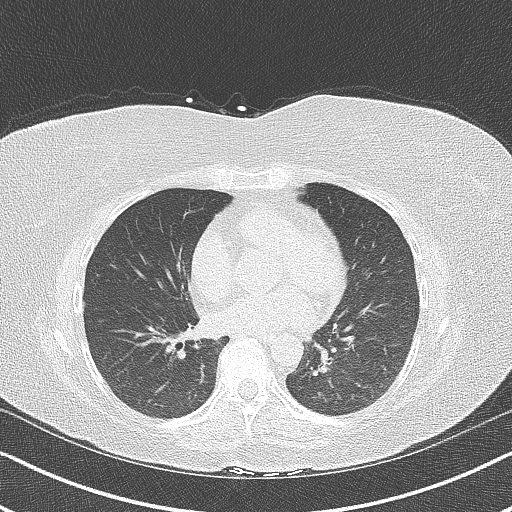
[im 33/49  lung]
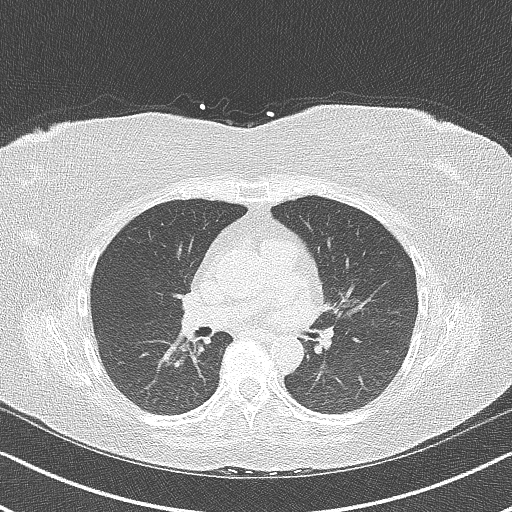
[im 41/49  lung]
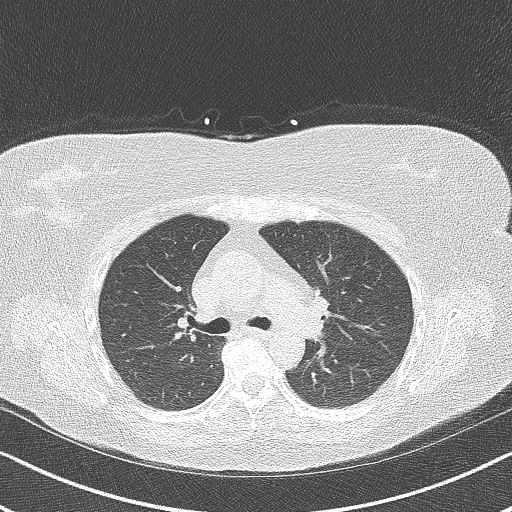

[Series 4: lung st 71 % · axial · 0.68mm/px · z∈[+1336,+1432]mm · 5 of 49 slices shown]
[im 9/49  lung]
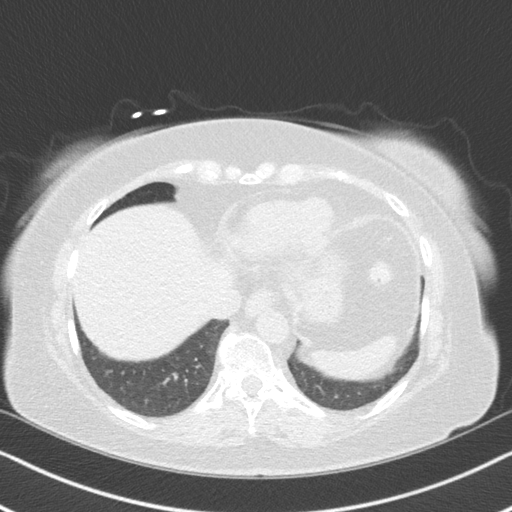
[im 17/49  lung]
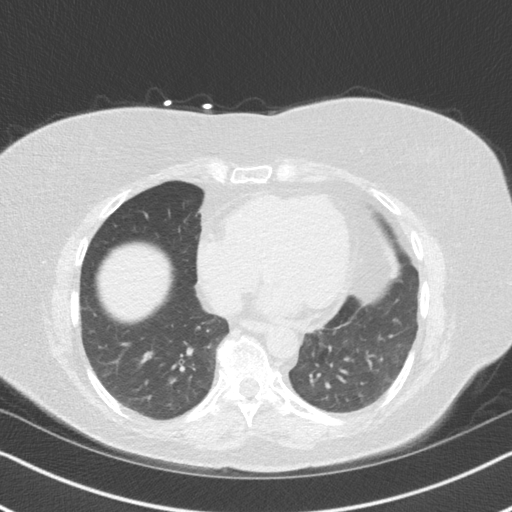
[im 25/49  lung]
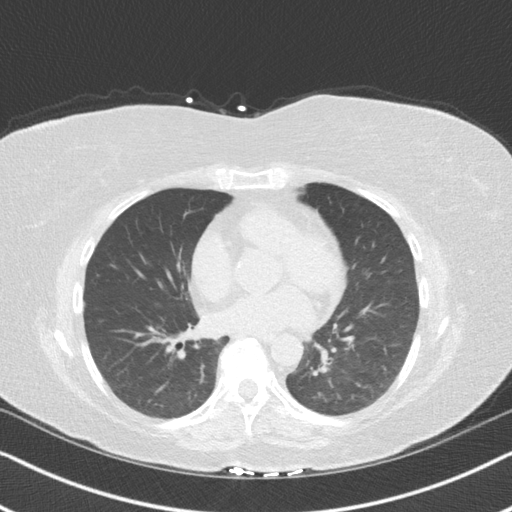
[im 33/49  lung]
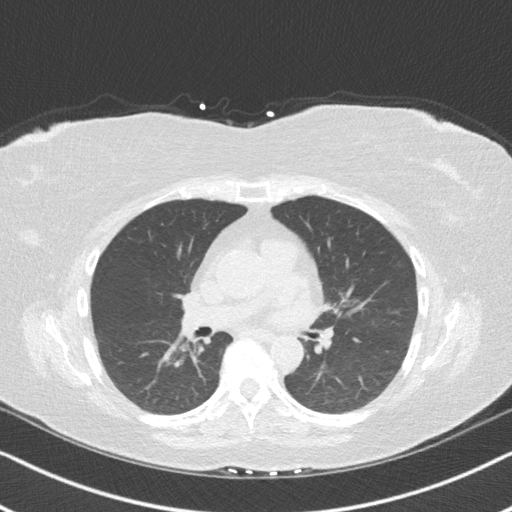
[im 41/49  lung]
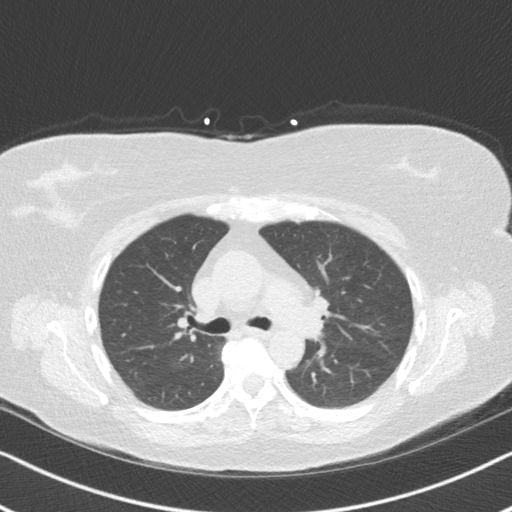

[14 of 20 positions shown; findings below may reference images not displayed]

FINDINGS: Atherosclerotic calcifications in the thoracic aorta. Small
calcified granuloma in the right upper lobe. Within the visualized
portions of the thorax there are no suspicious appearing pulmonary
nodules or masses, there is no acute consolidative airspace disease,
no pleural effusions, no pneumothorax and no lymphadenopathy.
Visualized portions of the upper abdomen are unremarkable. There are
no aggressive appearing lytic or blastic lesions noted in the
visualized portions of the skeleton.
IMPRESSION: 1.  Aortic Atherosclerosis (8UIRX-EDE.E).
FINDINGS: Non-cardiac: See separate report from [REDACTED].

Ascending Aorta: Normal caliber.  Aortic atherosclerosis.

Pericardium: Normal.

Coronary arteries: Normal origins.

Coronary Calcium Score:

Left main: 1

Left anterior descending artery: 0

Left circumflex artery: 0

Right coronary artery: 0

Total: 1

Percentile: 37th for age, sex, and race matched control.
IMPRESSION: 1. Coronary calcium score of 1. This was 37th percentile for age,
gender, and race matched controls.

2.  Aortic atherosclerosis noted.

RECOMMENDATIONS:



If CAC = 0, it is reasonable to withhold statin therapy and reassess
in 5 to 10 years, as long as higher risk conditions are absent
(diabetes mellitus, family history of premature CHD in first degree
relatives (males <55 years; females <65 years), cigarette smoking,
LDL >=190 mg/dL or other independent risk factors).

If CAC is 1 to 99, it is reasonable to initiate statin therapy for
patients >=55 years of age.

If CAC is >=100 or >=75th percentile, it is reasonable to initiate
statin therapy at any age.

Cardiology referral should be considered for patients with CAC
scores =400 or >=75th percentile.

*1147 AHA/ACC/AACVPR/AAPA/ABC/EMIR-SANELA/MAULET/BRIDGEMAN/Tasem/LAURA PIOJA/NXEMJE/ADALISA
Guideline on the Management of Blood Cholesterol: A Report of the
American College of Cardiology/American Heart Association Task Force
on Clinical Practice Guidelines. J Am Coll Cardiol.
6678;73(24):6206-6665.

*** End of Addendum ***
EXAM:
OVER-READ INTERPRETATION  CT CHEST

The following report is an over-read performed by radiologist Dr.
Le Lucena [REDACTED] on 08/21/2020. This
over-read does not include interpretation of cardiac or coronary
anatomy or pathology. The coronary calcium score interpretation by
the cardiologist is attached.
FINDINGS: Atherosclerotic calcifications in the thoracic aorta. Small
calcified granuloma in the right upper lobe. Within the visualized
portions of the thorax there are no suspicious appearing pulmonary
nodules or masses, there is no acute consolidative airspace disease,
no pleural effusions, no pneumothorax and no lymphadenopathy.
Visualized portions of the upper abdomen are unremarkable. There are
no aggressive appearing lytic or blastic lesions noted in the
visualized portions of the skeleton.
IMPRESSION: 1.  Aortic Atherosclerosis (8UIRX-EDE.E).

## 2022-03-21 NOTE — Telephone Encounter (Signed)
See below regarding Myrbetric, I have addressed colonoscopy part.

## 2022-03-24 ENCOUNTER — Ambulatory Visit (INDEPENDENT_AMBULATORY_CARE_PROVIDER_SITE_OTHER): Payer: Medicare HMO | Admitting: Family

## 2022-03-24 ENCOUNTER — Encounter: Payer: Self-pay | Admitting: Family

## 2022-03-24 VITALS — BP 110/66 | HR 88 | Temp 97.7°F | Resp 16 | Ht 62.0 in | Wt 160.8 lb

## 2022-03-24 DIAGNOSIS — M79674 Pain in right toe(s): Secondary | ICD-10-CM

## 2022-03-24 LAB — URIC ACID: Uric Acid, Serum: 3.7 mg/dL (ref 2.4–7.0)

## 2022-03-24 MED ORDER — NAPROXEN 500 MG PO TABS: 500.0000 mg | ORAL_TABLET | Freq: Two times a day (BID) | ORAL | 0 refills | Status: DC

## 2022-03-24 NOTE — Progress Notes (Signed)
Your uric acid level is in normal range. But this does not rule out gout in your toe. Around 60% of people will not have an elevated level. I would continue the Naproxen I sent for you, increase your water intake, and let Dr Yong Channel know if the pain is no improving.

## 2022-03-24 NOTE — Patient Instructions (Addendum)
It was very nice to see you today!   I will review your lab results via MyChart in a few days.  I have sent Naproxen to your pharmacy for pain. Take this just for next 5 days. Call back if pain is not improved. Try to get up to 2 liters of water in daily!  See the handout attached regarding gout and foods to avoid.      PLEASE NOTE:  If you had any lab tests please let us know if you have not heard back within a few days. You may see your results on MyChart before we have a chance to review them but we will give you a call once they are reviewed by [place]. If we ordered any referrals today, please let us know if you have not heard from their office within the next week.

## 2022-03-24 NOTE — Progress Notes (Signed)
Patient ID: Kristin Bonilla, female    DOB: 11/27/1950, 72 y.o.   MRN: [id number]  Chief Complaint  Patient presents with   Toe Pain    Right big toe pain, started last night. Pain described as constant ache and stabbing     HPI:      Right toe pain:   reports intense pain when she put her weight down on her toe, then went to bed but pain woke her up again during the night, continues to hurt, also reports swelling and mild redness. Denies any history of gout.  Pt has not taken anything for pain due to being told she has poor kidney function when checked in Jan.    Assessment & Plan:  1. Pain of right great toe - sounds like a gout episode, though pt has never had before. Checking uric acid level. Advised on low purine diet and can try yogurt or 100% cherry juice to reduce uric acid. Sending Naproxen bid for 4-5d, pt kidney fx down, advised on use & SE, she must drink up to 2L of water while taking.  - Uric acid - naproxen (NAPROSYN) 500 MG tablet; Take 1 tablet (500 mg total) by mouth 2 (two) times daily with a meal.  Dispense: 12 tablet; Refill: 0   Subjective:    Outpatient Medications Prior to Visit  Medication Sig Dispense Refill   acetaminophen (TYLENOL) 500 MG tablet Take 1,000 mg by mouth daily as needed for mild pain or headache.     albuterol (VENTOLIN HFA) 108 (90 Base) MCG/ACT inhaler Inhale 2 puffs into the lungs every 6 (six) hours as needed for wheezing or shortness of breath. Inhale 1 puff, hold for a few seconds, wait a moment, then inhale 2nd puff. 8 g 0   aspirin EC 81 MG tablet Take 81 mg by mouth daily.     Biotin 5000 MCG CAPS Take 5,000 mcg by mouth daily.     Calcium Carbonate-Vit D-Min (CALCIUM 1200 PO) Take 1 tablet by mouth daily.     desvenlafaxine (PRISTIQ) 50 MG 24 hr tablet Take 1 tablet (50 mg total) by mouth daily. 90 tablet 3   fenofibrate 160 MG tablet Take 1 tablet (160 mg total) by mouth daily. 90 tablet 3   levocetirizine (XYZAL) 5 MG tablet Take  1 tablet (5 mg total) by mouth every evening. 90 tablet 3   Melatonin 5 MG TABS Take 1 tablet by mouth at bedtime.     mirabegron ER (MYRBETRIQ) 25 MG TB24 tablet Take 1 tablet (25 mg total) by mouth daily. 90 tablet 3   montelukast (SINGULAIR) 10 MG tablet Take 1 tablet (10 mg total) by mouth at bedtime. 90 tablet 3   Multiple Vitamin (MULTIVITAMIN) tablet Take 1 tablet by mouth daily.     Omega-3 Fatty Acids (FISH OIL BURP-LESS PO) Take 1 capsule by mouth daily.     omeprazole (PRILOSEC) 20 MG capsule Take 1-2 capsules (20-40 mg total) by mouth daily. 180 capsule 3   polyethylene glycol powder (GLYCOLAX/MIRALAX) 17 GM/SCOOP powder Take 17 g by mouth daily as needed for mild constipation.     Probiotic Product (PROBIOTIC ADVANCED PO) Take by mouth daily.     rosuvastatin (CRESTOR) 5 MG tablet TAKE 1 TABLET EVERY DAY 90 tablet 3   traZODone (DESYREL) 100 MG tablet TAKE 1 TABLET AT BEDTIME 90 tablet 3   No facility-administered medications prior to visit.   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 No pertinent surgical history. Allergies  Allergen Reactions   Erythromycin Swelling   Sulfa Antibiotics    Sulfonamide Derivatives Nausea Only      Objective:    Physical Exam Vitals and nursing note reviewed.  Constitutional:      Appearance: Normal appearance.  HENT:     Nose: Congestion: naproxen.  Cardiovascular:     Rate and Rhythm: Normal rate and regular rhythm.  Pulmonary:     Effort: Pulmonary effort is normal.     Breath sounds: Normal breath sounds.  Musculoskeletal:        General: Normal range of motion.  Skin:    General: Skin is warm and dry.  Neurological:     Mental Status: She is alert.  Psychiatric:        Mood and Affect:  Mood normal.        Behavior: Behavior normal.    BP 110/66   Pulse 88   Temp 97.7 F (36.5 C) (Temporal)   Resp 16   Ht '5\' 2"'$  (1.575 m)   Wt 160 lb 12.8 oz (72.9 kg)   SpO2 97%   BMI 29.41 kg/m  Wt Readings from Last 3 Encounters:  03/24/22 160 lb 12.8 oz (72.9 kg)  01/31/22 156 lb 12.8 oz (71.1 kg)  01/24/22 159 lb (72.1 kg)      Jeanie Sewer, NP

## 2022-04-06 ENCOUNTER — Ambulatory Visit: Payer: Medicare PPO | Admitting: Physician Assistant

## 2022-05-16 ENCOUNTER — Telehealth: Payer: Self-pay | Admitting: Family Medicine

## 2022-05-16 NOTE — Telephone Encounter (Signed)
Copied from CRM [id number]. Topic: Medicare AWV >> May 16, 2022  1:26 PM Gwenith Spitz wrote: Reason for CRM: Called patient to reschedule Medicare Annual Wellness Visit (AWV). Left message for patient to call back and reschedule Medicare Annual Wellness Visit (AWV).  Last date of AWV: 06/07/2021  Please schedule an appointment at any time with Inetta Fermo, [facility]. Please schedule AWVS with Inetta Fermo, [facility].  If any questions, please contact me at [phone].  Thank you ,  Gabriel Cirri [facility] AWV TEAM Direct Dial [phone]

## 2022-05-23 ENCOUNTER — Telehealth: Payer: Self-pay | Admitting: Family Medicine

## 2022-05-23 NOTE — Telephone Encounter (Signed)
Contacted Kristin Bonilla to schedule their annual wellness visit. Appointment made for 06/14/2022.  Gabriel Cirri [facility] AWV TEAM Direct Dial [phone]

## 2022-06-01 ENCOUNTER — Ambulatory Visit (INDEPENDENT_AMBULATORY_CARE_PROVIDER_SITE_OTHER): Payer: Medicare HMO | Admitting: Physician Assistant

## 2022-06-01 ENCOUNTER — Encounter: Payer: Self-pay | Admitting: Physician Assistant

## 2022-06-01 ENCOUNTER — Telehealth: Payer: Self-pay | Admitting: *Deleted

## 2022-06-01 VITALS — BP 110/80 | HR 95 | Temp 98.2°F | Ht 62.0 in | Wt 160.2 lb

## 2022-06-01 DIAGNOSIS — R059 Cough, unspecified: Secondary | ICD-10-CM

## 2022-06-01 DIAGNOSIS — J069 Acute upper respiratory infection, unspecified: Secondary | ICD-10-CM

## 2022-06-01 LAB — POC COVID19 BINAXNOW: SARS Coronavirus 2 Ag: NEGATIVE

## 2022-06-01 LAB — POC INFLUENZA A&B (BINAX/QUICKVUE)
Influenza A, POC: NEGATIVE
Influenza B, POC: NEGATIVE

## 2022-06-01 MED ORDER — AIRSUPRA 90-80 MCG/ACT IN AERO: 1.0000 | INHALATION_SPRAY | Freq: Four times a day (QID) | RESPIRATORY_TRACT | 1 refills | Status: AC | PRN

## 2022-06-01 MED ORDER — DOXYCYCLINE HYCLATE 100 MG PO TABS: 100.0000 mg | ORAL_TABLET | Freq: Two times a day (BID) | ORAL | 0 refills | Status: DC

## 2022-06-01 MED ORDER — BENZONATATE 100 MG PO CAPS: 100.0000 mg | ORAL_CAPSULE | Freq: Three times a day (TID) | ORAL | 1 refills | Status: DC | PRN

## 2022-06-01 MED ORDER — ALBUTEROL SULFATE HFA 108 (90 BASE) MCG/ACT IN AERS
2.0000 | INHALATION_SPRAY | Freq: Four times a day (QID) | RESPIRATORY_TRACT | 2 refills | Status: AC | PRN
Start: 2022-06-01 — End: ?

## 2022-06-01 NOTE — Telephone Encounter (Signed)
Spoke to pt told her the inhaler Airsupra that Lelon Mast ordered is not covered by your insurance she said for you to use your Albuterol inhaler. Pt verbalized understanding. Asked her if she needed a refill? Pt said yes. Told her will send Rx to [facility] in [place]. Pt verbalized understanding.

## 2022-06-01 NOTE — Patient Instructions (Signed)
It was great to see you!  Start doxycycline antibiotic for your symptoms  Use delsym in the morning May take tessalon Perles as needed during the day  Change your albuterol inhaler to a different inhaler that I have sent in. This inhaler has a steroid in it that will hopefully help calm down this cough.  If new/worsening symptoms, reach out  Take care,  Jarold Motto PA-C

## 2022-06-01 NOTE — Progress Notes (Signed)
Kristin Bonilla is a 72 y.o. female here for a new problem.  History of Present Illness:   Chief Complaint  Patient presents with   Cough    Pt c/o cough started on Sunday, coughing spells, expectorating brown sputum, hoarse, no fever or chills, fatigue. She has been taking Mucinex, Delsym cough syrup, inhaler.    Upper Respiratory Infection Has been experiencing cough with brown sputum, hoarseness, chills, fatigue since 05/29/22. Denies fever. Using albuterol, MucinexER, Delsym. Usually does not need albuterol inhaler- used in the past for chest tightness. Has been traveling recently, returned 05/24/22, and was exposed to cigarette smoke. She has been sleeping well. Husband is not sick. Started blowing nose today. Water and food intake adequate.  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  Family History  Problem Relation Age of Onset   Cancer Father        melanoma   Heart disease Father        Died at 52   Diverticulosis Father    COPD Mother    Osteoporosis Mother    Hiatal hernia Mother    Stroke Paternal Grandmother    Heart disease Other        died 21, around age 35-CABG, smoker   GER disease Son    Mental retardation Son    Heart disease Son     Allergies  Allergen Reactions   Erythromycin Swelling   Sulfa Antibiotics    Sulfonamide Derivatives Nausea Only    Current Medications:    Current Outpatient Medications:    acetaminophen (TYLENOL) 500 MG tablet, Take 1,000 mg by mouth daily as needed for mild pain or headache., Disp: , Rfl:    albuterol (VENTOLIN HFA) 108 (90 Base) MCG/ACT inhaler, Inhale 2 puffs into the lungs every 6 (six) hours as needed for wheezing or shortness of breath. Inhale 1 puff, hold for a few seconds, wait a moment, then inhale 2nd puff., Disp: 8 g, Rfl: 0   aspirin EC 81 MG tablet, Take 81 mg by mouth daily., Disp: , Rfl:    Biotin 5000 MCG CAPS, Take 5,000 mcg by mouth daily., Disp: , Rfl:    Calcium Carbonate-Vit D-Min (CALCIUM 1200 PO), Take 1 tablet by mouth daily., Disp: , Rfl:    desvenlafaxine (PRISTIQ) 50 MG 24 hr tablet, Take 1 tablet (50 mg total) by mouth daily., Disp: 90 tablet, Rfl: 3   fenofibrate 160 MG tablet, Take 1 tablet (160 mg total) by mouth daily., Disp: 90 tablet, Rfl: 3   levocetirizine (XYZAL) 5 MG tablet, Take 1 tablet (5 mg total) by mouth every evening., Disp: 90 tablet, Rfl: 3   Melatonin 5 MG TABS, Take 1 tablet by mouth at bedtime., Disp: , Rfl:    mirabegron ER (MYRBETRIQ) 25 MG TB24 tablet, Take 1 tablet (25 mg total)  by mouth daily., Disp: 90 tablet, Rfl: 3   montelukast (SINGULAIR) 10 MG tablet, Take 1 tablet (10 mg total) by mouth at bedtime., Disp: 90 tablet, Rfl: 3   Multiple Vitamin (MULTIVITAMIN) tablet, Take 1 tablet by mouth daily., Disp: , Rfl:    Omega-3 Fatty Acids (FISH OIL BURP-LESS PO), Take 1 capsule by mouth daily., Disp: , Rfl:    omeprazole (PRILOSEC) 20 MG capsule, Take 1-2 capsules (20-40 mg total) by mouth daily., Disp: 180 capsule, Rfl: 3   polyethylene glycol powder (GLYCOLAX/MIRALAX) 17 GM/SCOOP powder, Take 17 g by mouth daily as needed for mild constipation., Disp: , Rfl:    Probiotic Product (PROBIOTIC ADVANCED PO), Take by mouth daily., Disp: , Rfl:    rosuvastatin (CRESTOR) 5 MG tablet, TAKE 1 TABLET EVERY DAY, Disp: 90 tablet, Rfl: 3   traZODone (DESYREL) 100 MG tablet, TAKE 1 TABLET  AT BEDTIME, Disp: 90 tablet, Rfl: 3   Review of Systems:   Review of Systems  Constitutional:  Positive for chills and malaise/fatigue. Negative for fever.  HENT:  Negative for congestion.   Eyes:  Negative for blurred vision.  Respiratory:  Positive for cough and sputum production Manson Passey). Negative for shortness of breath.   Cardiovascular:  Negative for chest pain, palpitations and leg swelling.  Gastrointestinal:  Negative for vomiting.  Musculoskeletal:  Negative for back pain.  Skin:  Negative for rash.  Neurological:  Negative for loss of consciousness and headaches.    Vitals:   Vitals:   06/01/22 0939  BP: 110/80  Pulse: 95  Temp: 98.2 F (36.8 C)  TempSrc: Temporal  SpO2: 95%  Weight: 160 lb 4 oz (72.7 kg)  Height: 5\' 2"  (1.575 m)     Body mass index is 29.31 kg/m.  Physical Exam:   Physical Exam Vitals and nursing note reviewed.  Constitutional:      General: She is not in acute distress.    Appearance: She is well-developed. She is not ill-appearing or toxic-appearing.  HENT:     Head: Normocephalic and atraumatic.     Right Ear: Tympanic membrane, ear canal and external ear normal. Tympanic membrane is not erythematous, retracted or bulging.     Left Ear: Tympanic membrane, ear canal and external ear normal. Tympanic membrane is not erythematous, retracted or bulging.     Nose: Nose normal.     Right Sinus: No maxillary sinus tenderness or frontal sinus tenderness.     Left Sinus: No maxillary sinus tenderness or frontal sinus tenderness.     Mouth/Throat:     Pharynx: Uvula midline. No posterior oropharyngeal erythema.  Eyes:     General: Lids are normal.     Conjunctiva/sclera: Conjunctivae normal.  Neck:     Trachea: Trachea normal.  Cardiovascular:     Rate and Rhythm: Normal rate and regular rhythm.     Pulses: Normal pulses.     Heart sounds: Normal heart sounds, S1 normal and S2 normal.  Pulmonary:     Effort: Pulmonary effort is normal.      Breath sounds: Normal breath sounds. No decreased breath sounds, wheezing, rhonchi or rales.  Lymphadenopathy:     Cervical: No cervical adenopathy.  Skin:    General: Skin is warm and dry.  Neurological:     Mental Status: She is alert.     GCS: GCS eye subscore is 4. GCS verbal subscore is 5. GCS motor subscore is 6.  Psychiatric:        Speech:  Speech normal.        Behavior: Behavior normal. Behavior is cooperative.    Results for orders placed or performed in visit on 06/01/22  POC COVID-19  Result Value Ref Range   SARS Coronavirus 2 Ag Negative Negative  POC Influenza A&B(BINAX/QUICKVUE)  Result Value Ref Range   Influenza A, POC Negative Negative   Influenza B, POC Negative Negative    Assessment and Plan:   Cough, unspecified type No red flags on exam.  COVID and flu test are negative. Will initiate doxycycline, tessalon Perles and airspura prn per orders. Discussed taking medications as prescribed. Reviewed return precautions including worsening fever, SOB, worsening cough or other concerns. Push fluids and rest. I recommend that patient follow-up if symptoms worsen or persist despite treatment x 7-10 days, sooner if needed.  I,Alexander Ruley,acting as a Neurosurgeon for [employer], PA.,have documented all relevant documentation on the behalf of Jarold Motto, PA,as directed by  Jarold Motto, PA while in the presence of Jarold Motto, [place].   I, Jarold Motto, [place], have reviewed all documentation for this visit. The documentation on 06/01/22 for the exam, diagnosis, procedures, and orders are all accurate and complete.    Jarold Motto, PA-C

## 2022-06-08 ENCOUNTER — Encounter: Payer: Self-pay | Admitting: Physician Assistant

## 2022-06-14 ENCOUNTER — Ambulatory Visit (INDEPENDENT_AMBULATORY_CARE_PROVIDER_SITE_OTHER): Payer: Medicare HMO

## 2022-06-14 VITALS — Wt 159.5 lb

## 2022-06-14 DIAGNOSIS — N958 Other specified menopausal and perimenopausal disorders: Secondary | ICD-10-CM | POA: Diagnosis not present

## 2022-06-14 DIAGNOSIS — Z0000 Encounter for general adult medical examination without abnormal findings: Secondary | ICD-10-CM

## 2022-06-14 DIAGNOSIS — Z1231 Encounter for screening mammogram for malignant neoplasm of breast: Secondary | ICD-10-CM | POA: Diagnosis not present

## 2022-06-14 DIAGNOSIS — Z1151 Encounter for screening for human papillomavirus (HPV): Secondary | ICD-10-CM | POA: Diagnosis not present

## 2022-06-14 DIAGNOSIS — Z124 Encounter for screening for malignant neoplasm of cervix: Secondary | ICD-10-CM | POA: Diagnosis not present

## 2022-06-14 DIAGNOSIS — M8588 Other specified disorders of bone density and structure, other site: Secondary | ICD-10-CM | POA: Diagnosis not present

## 2022-06-14 DIAGNOSIS — Z6829 Body mass index (BMI) 29.0-29.9, adult: Secondary | ICD-10-CM | POA: Diagnosis not present

## 2022-06-14 DIAGNOSIS — K219 Gastro-esophageal reflux disease without esophagitis: Secondary | ICD-10-CM | POA: Diagnosis not present

## 2022-06-14 LAB — HM MAMMOGRAPHY

## 2022-06-14 NOTE — Patient Instructions (Addendum)
Kristin Bonilla , Thank you for taking time to come for your Medicare Wellness Visit. I appreciate your ongoing commitment to your health goals. Please review the following plan we discussed and let me know if I can assist you in the future.   These are the goals we discussed:  Goals      Increase physical activity     Wants to increase social circle and find a church home      Patient Stated     Lose weight      Patient Stated     Continue to loose weight      Patient Stated     Lose weight and exercise more      Weight (lb) < 150 lb (68 kg)     Lose weight by following diet provided by nutritionist.         This is a list of the screening recommended for you and due dates:  Health Maintenance  Topic Date Due   Mammogram  03/27/2022   Flu Shot  08/18/2022   Medicare Annual Wellness Visit  06/14/2023   Colon Cancer Screening  03/16/2025   Pneumonia Vaccine  Completed   DEXA scan (bone density measurement)  Completed   Hepatitis C Screening  Completed   Zoster (Shingles) Vaccine  Completed   HPV Vaccine  Aged Out   DTaP/Tdap/Td vaccine  Discontinued   COVID-19 Vaccine  Discontinued    Advanced directives: Please bring a copy of your health care power of attorney and living will to the office at your convenience.  Conditions/risks identified: lose weight and exercise more   Next appointment: Follow up in one year for your annual wellness visit    Preventive Care 65 Years and Older, Female Preventive care refers to lifestyle choices and visits with your health care provider that can promote health and wellness. What does preventive care include? A yearly physical exam. This is also called an annual well check. Dental exams once or twice a year. Routine eye exams. Ask your health care provider how often you should have your eyes checked. Personal lifestyle choices, including: Daily care of your teeth and gums. Regular physical activity. Eating a healthy diet. Avoiding  tobacco and drug use. Limiting alcohol use. Practicing safe sex. Taking low-dose aspirin every day. Taking vitamin and mineral supplements as recommended by your health care provider. What happens during an annual well check? The services and screenings done by your health care provider during your annual well check will depend on your age, overall health, lifestyle risk factors, and family history of disease. Counseling  Your health care provider may ask you questions about your: Alcohol use. Tobacco use. Drug use. Emotional well-being. Home and relationship well-being. Sexual activity. Eating habits. History of falls. Memory and ability to understand (cognition). Work and work Astronomer. Reproductive health. Screening  You may have the following tests or measurements: Height, weight, and BMI. Blood pressure. Lipid and cholesterol levels. These may be checked every 5 years, or more frequently if you are over 66 years old. Skin check. Lung cancer screening. You may have this screening every year starting at age 19 if you have a 30-pack-year history of smoking and currently smoke or have quit within the past 15 years. Fecal occult blood test (FOBT) of the stool. You may have this test every year starting at age 31. Flexible sigmoidoscopy or colonoscopy. You may have a sigmoidoscopy every 5 years or a colonoscopy every 10 years starting at age  50. Hepatitis C blood test. Hepatitis B blood test. Sexually transmitted disease (STD) testing. Diabetes screening. This is done by checking your blood sugar (glucose) after you have not eaten for a while (fasting). You may have this done every 1-3 years. Bone density scan. This is done to screen for osteoporosis. You may have this done starting at age 27. Mammogram. This may be done every 1-2 years. Talk to your health care provider about how often you should have regular mammograms. Talk with your health care provider about your test  results, treatment options, and if necessary, the need for more tests. Vaccines  Your health care provider may recommend certain vaccines, such as: Influenza vaccine. This is recommended every year. Tetanus, diphtheria, and acellular pertussis (Tdap, Td) vaccine. You may need a Td booster every 10 years. Zoster vaccine. You may need this after age 59. Pneumococcal 13-valent conjugate (PCV13) vaccine. One dose is recommended after age 94. Pneumococcal polysaccharide (PPSV23) vaccine. One dose is recommended after age 52. Talk to your health care provider about which screenings and vaccines you need and how often you need them. This information is not intended to replace advice given to you by your health care provider. Make sure you discuss any questions you have with your health care provider. Document Released: 01/30/2015 Document Revised: 09/23/2015 Document Reviewed: 11/04/2014 Elsevier Interactive Patient Education  2017 [employer].  Fall Prevention in the Home Falls can cause injuries. They can happen to people of all ages. There are many things you can do to make your home safe and to help prevent falls. What can I do on the outside of my home? Regularly fix the edges of walkways and driveways and fix any cracks. Remove anything that might make you trip as you walk through a door, such as a raised step or threshold. Trim any bushes or trees on the path to your home. Use bright outdoor lighting. Clear any walking paths of anything that might make someone trip, such as rocks or tools. Regularly check to see if handrails are loose or broken. Make sure that both sides of any steps have handrails. Any raised decks and porches should have guardrails on the edges. Have any leaves, snow, or ice cleared regularly. Use sand or salt on walking paths during winter. Clean up any spills in your garage right away. This includes oil or grease spills. What can I do in the bathroom? Use night  lights. Install grab bars by the toilet and in the tub and shower. Do not use towel bars as grab bars. Use non-skid mats or decals in the tub or shower. If you need to sit down in the shower, use a plastic, non-slip stool. Keep the floor dry. Clean up any water that spills on the floor as soon as it happens. Remove soap buildup in the tub or shower regularly. Attach bath mats securely with double-sided non-slip rug tape. Do not have throw rugs and other things on the floor that can make you trip. What can I do in the bedroom? Use night lights. Make sure that you have a light by your bed that is easy to reach. Do not use any sheets or blankets that are too big for your bed. They should not hang down onto the floor. Have a firm chair that has side arms. You can use this for support while you get dressed. Do not have throw rugs and other things on the floor that can make you trip. What can I do  in the kitchen? Clean up any spills right away. Avoid walking on wet floors. Keep items that you use a lot in easy-to-reach places. If you need to reach something above you, use a strong step stool that has a grab bar. Keep electrical cords out of the way. Do not use floor polish or wax that makes floors slippery. If you must use wax, use non-skid floor wax. Do not have throw rugs and other things on the floor that can make you trip. What can I do with my stairs? Do not leave any items on the stairs. Make sure that there are handrails on both sides of the stairs and use them. Fix handrails that are broken or loose. Make sure that handrails are as long as the stairways. Check any carpeting to make sure that it is firmly attached to the stairs. Fix any carpet that is loose or worn. Avoid having throw rugs at the top or bottom of the stairs. If you do have throw rugs, attach them to the floor with carpet tape. Make sure that you have a light switch at the top of the stairs and the bottom of the stairs. If  you do not have them, ask someone to add them for you. What else can I do to help prevent falls? Wear shoes that: Do not have high heels. Have rubber bottoms. Are comfortable and fit you well. Are closed at the toe. Do not wear sandals. If you use a stepladder: Make sure that it is fully opened. Do not climb a closed stepladder. Make sure that both sides of the stepladder are locked into place. Ask someone to hold it for you, if possible. Clearly mark and make sure that you can see: Any grab bars or handrails. First and last steps. Where the edge of each step is. Use tools that help you move around (mobility aids) if they are needed. These include: Canes. Walkers. Scooters. Crutches. Turn on the lights when you go into a dark area. Replace any light bulbs as soon as they burn out. Set up your furniture so you have a clear path. Avoid moving your furniture around. If any of your floors are uneven, fix them. If there are any pets around you, be aware of where they are. Review your medicines with your doctor. Some medicines can make you feel dizzy. This can increase your chance of falling. Ask your doctor what other things that you can do to help prevent falls. This information is not intended to replace advice given to you by your health care provider. Make sure you discuss any questions you have with your health care provider. Document Released: 10/30/2008 Document Revised: 06/11/2015 Document Reviewed: 02/07/2014 Elsevier Interactive Patient Education  2017 [employer].

## 2022-06-14 NOTE — Progress Notes (Signed)
I connected with  Dicky Doe on 06/14/22 by a audio enabled telemedicine application and verified that I am speaking with the correct person using two identifiers.  Patient Location: Home  Provider Location: Office/Clinic  I discussed the limitations of evaluation and management by telemedicine. The patient expressed understanding and agreed to proceed.   Subjective:   Kristin Bonilla is a 72 y.o. female who presents for Medicare Annual (Subsequent) preventive examination.  Review of Systems     Cardiac Risk Factors include: advanced age (>27men, >22 women);dyslipidemia     Objective:    Today's Vitals   06/14/22 1451  Weight: 159 lb 8 oz (72.3 kg)   Body mass index is 29.17 kg/m.     06/14/2022    2:58 PM 06/07/2021   12:00 PM 06/01/2020   11:25 AM 05/24/2019    2:48 PM 08/01/2017    1:24 PM 07/18/2016    3:31 PM 01/19/2016    1:57 PM  Advanced Directives  Does Patient Have a Medical Advance Directive? Yes Yes Yes Yes Yes Yes No  Type of Estate agent of [place];Living will Healthcare Power of [place];Living will Healthcare Power of Attorney Living will;Healthcare Power of [employer] Power of [employer] Power of [employer];Living will   Does patient want to make changes to medical advance directive?    No - Patient declined No - Patient declined    Copy of Healthcare Power of Attorney in Chart? No - copy requested No - copy requested No - copy requested No - copy requested No - copy requested No - copy requested     Current Medications (verified) Outpatient Encounter Medications as of 06/14/2022  Medication Sig   acetaminophen (TYLENOL) 500 MG tablet Take 1,000 mg by mouth daily as needed for mild pain or headache.   albuterol (VENTOLIN HFA) 108 (90 Base) MCG/ACT inhaler Inhale 2 puffs into the lungs every 6 (six) hours as needed for wheezing or shortness of breath. Inhale 1 puff, hold for a few seconds, wait a moment, then inhale 2nd  puff.   Albuterol-Budesonide (AIRSUPRA) 90-80 MCG/ACT AERO Inhale 1 puff into the lungs every 6 (six) hours as needed (as needed for shortness of breath).   aspirin EC 81 MG tablet Take 81 mg by mouth daily.   benzonatate (TESSALON PERLES) 100 MG capsule Take 1 capsule (100 mg total) by mouth 3 (three) times daily as needed for cough.   Biotin 5000 MCG CAPS Take 5,000 mcg by mouth daily.   Calcium Carbonate-Vit D-Min (CALCIUM 1200 PO) Take 1 tablet by mouth daily.   desvenlafaxine (PRISTIQ) 50 MG 24 hr tablet Take 1 tablet (50 mg total) by mouth daily.   fenofibrate 160 MG tablet Take 1 tablet (160 mg total) by mouth daily.   levocetirizine (XYZAL) 5 MG tablet Take 1 tablet (5 mg total) by mouth every evening.   Melatonin 5 MG TABS Take 1 tablet by mouth at bedtime.   mirabegron ER (MYRBETRIQ) 25 MG TB24 tablet Take 1 tablet (25 mg total) by mouth daily.   montelukast (SINGULAIR) 10 MG tablet Take 1 tablet (10 mg total) by mouth at bedtime.   Multiple Vitamin (MULTIVITAMIN) tablet Take 1 tablet by mouth daily.   Omega-3 Fatty Acids (FISH OIL BURP-LESS PO) Take 1 capsule by mouth daily.   omeprazole (PRILOSEC) 20 MG capsule Take 1-2 capsules (20-40 mg total) by mouth daily.   polyethylene glycol powder (GLYCOLAX/MIRALAX) 17 GM/SCOOP powder Take 17 g by mouth daily  as needed for mild constipation.   Probiotic Product (PROBIOTIC ADVANCED PO) Take by mouth daily.   rosuvastatin (CRESTOR) 5 MG tablet TAKE 1 TABLET EVERY DAY   traZODone (DESYREL) 100 MG tablet TAKE 1 TABLET AT BEDTIME   [DISCONTINUED] cetirizine (ZYRTEC) 10 MG tablet Take 10 mg by mouth daily.     [DISCONTINUED] doxycycline (VIBRA-TABS) 100 MG tablet Take 1 tablet (100 mg total) by mouth 2 (two) times daily.   No facility-administered encounter medications on file as of 06/14/2022.    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 No pertinent surgical history. Family History  Problem Relation Age of Onset   Cancer Father        melanoma   Heart disease Father        Died at 15   Diverticulosis Father    COPD Mother    Osteoporosis Mother    Hiatal hernia Mother    Stroke Paternal Grandmother    Heart disease Other        died 31, around age 38-CABG, smoker   GER disease Son    Mental retardation Son    Heart disease Son    Social History   Socioeconomic History   Marital status: Married    Spouse name: Not on file   Number of children: Not on file   Years of education: Not on file   Highest education level: Not on file  Occupational History   Not on file  Tobacco Use   Smoking status: Never   Smokeless tobacco: Never  Vaping Use   Vaping Use: Never used  Substance and Sexual Activity   Alcohol use: No    Alcohol/week: 0.0 standard drinks of alcohol   Drug use: No   Sexual activity: Yes  Other Topics Concern   Not on file  Social History Narrative   Married. 2 children (son is special needs-in group home living). 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      Hobbies: reading, ipad, former bowling a lot   Social Determinants of Corporate investment banker Strain: Low Risk  (06/14/2022)   Overall Financial Resource Strain (CARDIA)    Difficulty of Paying Living Expenses: Not hard at all  Food Insecurity: No Food Insecurity (06/14/2022)   Hunger Vital Sign    Worried About Running Out of Food in the Last Year: Never true    Ran Out of Food in the Last Year: Never true  Transportation Needs: No Transportation Needs (06/14/2022)   PRAPARE - Administrator, Civil Service (Medical): No    Lack of Transportation  (Non-Medical): No  Physical Activity: Inactive (06/14/2022)   Exercise Vital Sign    Days of Exercise per Week: 0 days    Minutes of Exercise per Session: 0 min  Stress: No Stress Concern Present (06/14/2022)   [employer] of Occupational Health - Occupational Stress Questionnaire    Feeling of Stress : Not at all  Social Connections: Moderately Integrated (06/14/2022)   Social Connection and Isolation Panel [NHANES]    Frequency of Communication with Friends and Family: More than three times a week  Frequency of Social Gatherings with Friends and Family: More than three times a week    Attends Religious Services: More than 4 times per year    Active Member of [employer] or Organizations: No    Attends Engineer, structural: Never    Marital Status: Married    Tobacco Counseling Counseling given: Not Answered   Clinical Intake:  Pre-visit preparation completed: Yes  Pain : No/denies pain     BMI - recorded: 29.17 Nutritional Status: BMI 25 -29 Overweight Nutritional Risks: None Diabetes: No  How often do you need to have someone help you when you read instructions, pamphlets, or other written materials from your doctor or pharmacy?: 1 - Never  Diabetic?no  Interpreter Needed?: No  Information entered by :: Lorine Bears   Activities of Daily Living    06/14/2022    2:59 PM  In your present state of health, do you have any difficulty performing the following activities:  Hearing? 1  Comment wears hearing aids  Vision? 0  Difficulty concentrating or making decisions? 0  Walking or climbing stairs? 0  Dressing or bathing? 0  Doing errands, shopping? 0  Preparing Food and eating ? N  Using the Toilet? N  In the past six months, have you accidently leaked urine? N  Do you have problems with loss of bowel control? N  Managing your Medications? N  Managing your Finances? N  Housekeeping or managing your Housekeeping? N    Patient Care  Team: Shelva Majestic, MD as PCP - General (Family Medicine) Marcelle Overlie, MD as Consulting Physician (Obstetrics and Gynecology) Rhetta Mura Danella Deis, MD as Referring Physician (Internal Medicine) Glyn Ade, PA-C as Physician Assistant (Dermatology) Dimitri Ped, MD as Consulting Physician (Surgery) Dimitri Ped, MD as Consulting Physician (Optometry)  Indicate any recent Medical Services you may have received from other than [facility] providers in the past year (date may be approximate).     Assessment:   This is a routine wellness examination for [facility].  Hearing/Vision screen Hearing Screening - Comments:: Pt wears hearing aids  Vision Screening - Comments:: Pt follows up with Dr Benjamine Mola for annual eye exams   Dietary issues and exercise activities discussed: Current Exercise Habits: The patient does not participate in regular exercise at present   Goals Addressed             This Visit's Progress    Patient Stated       Lose weight and exercise more        Depression Screen    06/14/2022    2:56 PM 01/31/2022    9:06 AM 01/24/2022    8:31 AM 07/29/2021    9:35 AM 06/07/2021   11:56 AM 01/25/2021    2:31 PM 07/24/2020   10:32 AM  PHQ 2/9 Scores  PHQ - 2 Score 0 0 0 4 1 0 0  PHQ- 9 Score  3  5  3 3     Fall Risk    06/14/2022    2:59 PM 01/31/2022    9:00 AM 01/24/2022    8:31 AM 06/07/2021   12:02 PM 06/01/2020   11:26 AM  Fall Risk   Falls in the past year? 0 0 0 1 0  Number falls in past yr: 0 0 0 1 0  Injury with Fall? 0 0 0 1 0  [place]   Risk for fall due to : Impaired vision No  Fall Risks No Fall Risks Impaired vision Impaired vision  Follow up Falls prevention discussed Falls evaluation completed Falls evaluation completed Falls prevention discussed Falls prevention discussed    FALL RISK PREVENTION PERTAINING TO THE HOME:  Any stairs in or around the home? Yes  If so, are there any without handrails? No  Home  free of loose throw rugs in walkways, pet beds, electrical cords, etc? Yes  Adequate lighting in your home to reduce risk of falls? Yes   ASSISTIVE DEVICES UTILIZED TO PREVENT FALLS:  Life alert? No  Use of a cane, walker or w/c? No  Grab bars in the bathroom? Yes  Shower chair or bench in shower? Yes  Elevated toilet seat or a handicapped toilet? No   TIMED UP AND GO:  Was the test performed? No .  Cognitive Function:        06/14/2022    3:00 PM 06/07/2021   12:04 PM 06/01/2020   11:28 AM 05/24/2019    2:48 PM 08/01/2017    1:36 PM  6CIT Screen  What Year? 0 points 0 points 0 points 0 points 0 points  What month? 0 points 0 points 0 points 0 points 0 points  What time? 0 points 0 points  0 points 0 points  Count back from 20 0 points 0 points 0 points 0 points 0 points  Months in reverse 0 points 0 points 0 points 0 points 0 points  Repeat phrase 0 points 0 points 2 points 0 points   Total Score 0 points 0 points  0 points     Immunizations Immunization History  Administered Date(s) Administered   Fluad Quad(high Dose 65+) 09/13/2018, 10/28/2019   Influenza Split 11/26/2010, 12/12/2011   Influenza Whole 10/18/2006, 11/10/2008, 11/24/2009   Influenza, High Dose Seasonal PF 11/27/2015   Influenza,inj,Quad PF,6+ Mos 12/12/2012, 10/29/2013, 12/03/2014, 10/31/2020   Influenza-Unspecified 10/22/2016, 09/18/2017, 10/21/2021   PFIZER(Purple Top)SARS-COV-2 Vaccination 03/02/2019, 03/24/2019, 10/28/2019   Pneumococcal Conjugate-13 12/03/2014   Pneumococcal Polysaccharide-23 12/12/2011, 01/06/2017   Td 01/18/2000   Tdap 11/26/2010   Zoster Recombinat (Shingrix) 09/10/2017, 11/10/2017   Zoster, Live 04/09/2012    TDAP status: Due, Education has been provided regarding the importance of this vaccine. Advised may receive this vaccine at local pharmacy or Health Dept. Aware to provide a copy of the vaccination record if obtained from local pharmacy or Health Dept. Verbalized  acceptance and understanding.  Flu Vaccine status: Up to date  Pneumococcal vaccine status: Up to date  Covid-19 vaccine status: Completed vaccines  Qualifies for Shingles Vaccine? Yes   Zostavax completed Yes   Shingrix Completed?: Yes  Screening Tests Health Maintenance  Topic Date Due   MAMMOGRAM  03/27/2022   INFLUENZA VACCINE  08/18/2022   Medicare Annual Wellness (AWV)  06/14/2023   Colonoscopy  03/16/2025   Pneumonia Vaccine 1+ Years old  Completed   DEXA SCAN  Completed   Hepatitis C Screening  Completed   Zoster Vaccines- Shingrix  Completed   HPV VACCINES  Aged Out   DTaP/Tdap/Td  Discontinued   COVID-19 Vaccine  Discontinued    Health Maintenance  Health Maintenance Due  Topic Date Due   MAMMOGRAM  03/27/2022    Colorectal cancer screening: Type of screening: Colonoscopy. Completed 03/17/22. Repeat every 3 years  Mammogram status: Completed 06/14/22. Repeat every year  Bone Density status: Completed 06/14/22. Results reflect: Bone density results: OSTEOPENIA. Repeat every 2 years. Pt stated completed at [facility]YN reports will be sent   Additional  Screening:  Hepatitis C Screening:  Completed 01/19/16  Vision Screening: Recommended annual ophthalmology exams for early detection of glaucoma and other disorders of the eye. Is the patient up to date with their annual eye exam?  Yes  Who is the provider or what is the name of the office in which the patient attends annual eye exams? Dr Benjamine Mola If pt is not established with a provider, would they like to be referred to a provider to establish care? No .   Dental Screening: Recommended annual dental exams for proper oral hygiene  Community Resource Referral / Chronic Care Management: CRR required this visit?  No   CCM required this visit?  No      Plan:     I have personally reviewed and noted the following in the patient's chart:   Medical and social history Use of alcohol, tobacco or illicit drugs   Current medications and supplements including opioid prescriptions. Patient is not currently taking opioid prescriptions. Functional ability and status Nutritional status Physical activity Advanced directives List of other physicians Hospitalizations, surgeries, and ER visits in previous 12 months Vitals Screenings to include cognitive, depression, and falls Referrals and appointments  In addition, I have reviewed and discussed with patient certain preventive protocols, quality metrics, and best practice recommendations. A written personalized care plan for preventive services as well as general preventive health recommendations were provided to patient.     Marzella Schlein, LPN   1/61/0960   Nurse Notes: none

## 2022-07-11 DIAGNOSIS — H43812 Vitreous degeneration, left eye: Secondary | ICD-10-CM | POA: Diagnosis not present

## 2022-07-11 DIAGNOSIS — H3509 Other intraretinal microvascular abnormalities: Secondary | ICD-10-CM | POA: Diagnosis not present

## 2022-07-11 DIAGNOSIS — H04123 Dry eye syndrome of bilateral lacrimal glands: Secondary | ICD-10-CM | POA: Diagnosis not present

## 2022-07-11 DIAGNOSIS — H25813 Combined forms of age-related cataract, bilateral: Secondary | ICD-10-CM | POA: Diagnosis not present

## 2022-07-29 DIAGNOSIS — R42 Dizziness and giddiness: Secondary | ICD-10-CM | POA: Diagnosis not present

## 2022-07-29 DIAGNOSIS — H9041 Sensorineural hearing loss, unilateral, right ear, with unrestricted hearing on the contralateral side: Secondary | ICD-10-CM | POA: Diagnosis not present

## 2022-08-02 ENCOUNTER — Encounter: Payer: Self-pay | Admitting: Family Medicine

## 2022-08-02 ENCOUNTER — Ambulatory Visit (INDEPENDENT_AMBULATORY_CARE_PROVIDER_SITE_OTHER): Payer: Medicare HMO | Admitting: Family Medicine

## 2022-08-02 VITALS — BP 110/64 | HR 77 | Temp 98.1°F | Ht 62.0 in | Wt 157.8 lb

## 2022-08-02 DIAGNOSIS — E785 Hyperlipidemia, unspecified: Secondary | ICD-10-CM

## 2022-08-02 DIAGNOSIS — F325 Major depressive disorder, single episode, in full remission: Secondary | ICD-10-CM | POA: Diagnosis not present

## 2022-08-02 DIAGNOSIS — Z0000 Encounter for general adult medical examination without abnormal findings: Secondary | ICD-10-CM

## 2022-08-02 DIAGNOSIS — N183 Chronic kidney disease, stage 3 unspecified: Secondary | ICD-10-CM | POA: Diagnosis not present

## 2022-08-02 LAB — COMPREHENSIVE METABOLIC PANEL
ALT: 17 U/L (ref 0–35)
AST: 25 U/L (ref 0–37)
Albumin: 4.5 g/dL (ref 3.5–5.2)
Alkaline Phosphatase: 52 U/L (ref 39–117)
BUN: 16 mg/dL (ref 6–23)
CO2: 29 mEq/L (ref 19–32)
Calcium: 10.5 mg/dL (ref 8.4–10.5)
Chloride: 104 mEq/L (ref 96–112)
Creatinine, Ser: 1.03 mg/dL (ref 0.40–1.20)
GFR: 54.1 mL/min — ABNORMAL LOW (ref >60.00)
Glucose, Bld: 100 mg/dL — ABNORMAL HIGH (ref 70–99)
Potassium: 3.9 mEq/L (ref 3.5–5.1)
Sodium: 140 mEq/L (ref 135–145)
Total Bilirubin: 0.5 mg/dL (ref 0.2–1.2)
Total Protein: 7.1 g/dL (ref 6.0–8.3)

## 2022-08-02 LAB — URINALYSIS, ROUTINE W REFLEX MICROSCOPIC
Bilirubin Urine: NEGATIVE
Hgb urine dipstick: NEGATIVE
Ketones, ur: NEGATIVE
Leukocytes,Ua: NEGATIVE
Nitrite: NEGATIVE
Specific Gravity, Urine: 1.01 (ref 1.000–1.030)
Total Protein, Urine: NEGATIVE
Urine Glucose: NEGATIVE
Urobilinogen, UA: 0.2 (ref 0.0–1.0)
pH: 7.5 (ref 5.0–8.0)

## 2022-08-02 LAB — LIPID PANEL
Cholesterol: 165 mg/dL (ref 0–200)
HDL: 70.1 mg/dL (ref >39.00)
LDL Cholesterol: 77 mg/dL (ref 0–99)
NonHDL: 95.3
Total CHOL/HDL Ratio: 2
Triglycerides: 90 mg/dL (ref 0.0–149.0)
VLDL: 18 mg/dL (ref 0.0–40.0)

## 2022-08-02 LAB — CBC WITH DIFFERENTIAL/PLATELET
Basophils Absolute: 0.1 10*3/uL (ref 0.0–0.1)
Basophils Relative: 3.5 % — ABNORMAL HIGH (ref 0.0–3.0)
Eosinophils Absolute: 0.2 10*3/uL (ref 0.0–0.7)
Eosinophils Relative: 5 % (ref 0.0–5.0)
HCT: 40.8 % (ref 36.0–46.0)
Hemoglobin: 13.4 g/dL (ref 12.0–15.0)
Lymphocytes Relative: 43 % (ref 12.0–46.0)
Lymphs Abs: 1.3 10*3/uL (ref 0.7–4.0)
MCHC: 32.7 g/dL (ref 30.0–36.0)
MCV: 90.5 fl (ref 78.0–100.0)
Monocytes Absolute: 0.3 10*3/uL (ref 0.1–1.0)
Monocytes Relative: 9.3 % (ref 3.0–12.0)
Neutro Abs: 1.2 10*3/uL — ABNORMAL LOW (ref 1.4–7.7)
Neutrophils Relative %: 39.2 % — ABNORMAL LOW (ref 43.0–77.0)
Platelets: 279 10*3/uL (ref 150.0–400.0)
RBC: 4.51 Mil/uL (ref 3.87–5.11)
RDW: 13.8 % (ref 11.5–15.5)
WBC: 3.1 10*3/uL — ABNORMAL LOW (ref 4.0–10.5)

## 2022-08-02 NOTE — Patient Instructions (Addendum)
Would love to see your restart your exercise- 150 minutes a week goal  Please stop by lab before you go If you have mychart- we will send your results within 3 business days of [place] receiving them.  If you do not have mychart- we will call you about results within 5 business days of [place] receiving them.  *please also note that you will see labs on mychart as soon as they post. I will later go in and write notes on them- will say "notes from Dr. Durene Cal"   Recommended follow up: Return in about 6 months (around 02/02/2023) for followup or sooner if needed.Schedule b4 you leave.

## 2022-08-02 NOTE — Progress Notes (Signed)
Phone [phone]   Subjective:  Patient presents today for their annual physical. Chief complaint-noted.   See problem oriented charting- ROS- full  review of systems was completed and negative except for: tinnitus from meniere's, constipation, arthritis  The following were reviewed and entered/updated in epic: Past Medical History:  Diagnosis Date   Allergy 1983   Seasonal   Arthritis    Cataracts, bilateral 06/14/2018   Chronic kidney disease    Stage 3   GERD (gastroesophageal reflux disease)    Hyperlipidemia    Internal thrombosed hemorrhoids 01/09/2008   Meniere disease    Menopause    Nuclear sclerosis 06/14/2018   Patient Active Problem List   Diagnosis Date Noted   Overactive bladder 07/29/2021    Priority: Medium    Aortic atherosclerosis (HCC) 08/22/2020    Priority: Medium    Leukopenia 05/29/2014    Priority: Medium    CKD (chronic kidney disease), stage III (HCC) 12/20/2013    Priority: Medium    Meniere disease 12/20/2013    Priority: Medium    Major depression in full remission (HCC) 06/07/2007    Priority: Medium    Hyperlipidemia 10/25/2006    Priority: Medium    Allergic rhinitis 07/18/2017    Priority: Low   Osteopenia 12/03/2014    Priority: Low   Insomnia 05/29/2014    Priority: Low   Alopecia 04/27/2009    Priority: Low   GERD (gastroesophageal reflux disease) 01/09/2008    Priority: Low   Irritable bowel syndrome 01/09/2008    Priority: Low   Viral upper respiratory tract infection 12/14/2020   Hematuria, microscopic 10/08/2018   Cataracts, bilateral 06/14/2018   Nuclear sclerosis 06/14/2018   History reviewed. No pertinent surgical history.  Family History  Problem Relation Age of Onset   Cancer Father        melanoma   Heart disease Father        Died at 42   Diverticulosis Father    Early death Father    COPD Mother    Osteoporosis Mother    Hiatal hernia Mother    Depression Mother    Early death Mother     Stroke Paternal Grandmother    Heart disease Other        died 57, around age 13-CABG, smoker   GER disease Son    Mental retardation Son    Heart disease Son    Birth defects Son     Medications- reviewed and updated Current Outpatient Medications  Medication Sig Dispense Refill   acetaminophen (TYLENOL) 500 MG tablet Take 1,000 mg by mouth daily as needed for mild pain or headache.     albuterol (VENTOLIN HFA) 108 (90 Base) MCG/ACT inhaler Inhale 2 puffs into the lungs every 6 (six) hours as needed for wheezing or shortness of breath. Inhale 1 puff, hold for a few seconds, wait a moment, then inhale 2nd puff. 8 g 2   Albuterol-Budesonide (AIRSUPRA) 90-80 MCG/ACT AERO Inhale 1 puff into the lungs every 6 (six) hours as needed (as needed for shortness of breath). 10.7 g 1   aspirin EC 81 MG tablet Take 81 mg by mouth daily.     benzonatate (TESSALON PERLES) 100 MG capsule Take 1 capsule (100 mg total) by mouth 3 (three) times daily as needed for cough. 30 capsule 1   Biotin 5000 MCG CAPS Take 5,000 mcg by mouth daily.     Calcium Carbonate-Vit D-Min (CALCIUM 1200 PO) Take 1 tablet by  mouth daily.     desvenlafaxine (PRISTIQ) 50 MG 24 hr tablet Take 1 tablet (50 mg total) by mouth daily. 90 tablet 3   fenofibrate 160 MG tablet Take 1 tablet (160 mg total) by mouth daily. 90 tablet 3   levocetirizine (XYZAL) 5 MG tablet Take 1 tablet (5 mg total) by mouth every evening. 90 tablet 3   Melatonin 5 MG TABS Take 1 tablet by mouth at bedtime.     mirabegron ER (MYRBETRIQ) 25 MG TB24 tablet Take 1 tablet (25 mg total) by mouth daily. 90 tablet 3   montelukast (SINGULAIR) 10 MG tablet Take 1 tablet (10 mg total) by mouth at bedtime. 90 tablet 3   Multiple Vitamin (MULTIVITAMIN) tablet Take 1 tablet by mouth daily.     Omega-3 Fatty Acids (FISH OIL BURP-LESS PO) Take 1 capsule by mouth daily.     omeprazole (PRILOSEC) 20 MG capsule Take 1-2 capsules (20-40 mg total) by mouth daily. 180 capsule 3    polyethylene glycol powder (GLYCOLAX/MIRALAX) 17 GM/SCOOP powder Take 17 g by mouth daily as needed for mild constipation.     Probiotic Product (PROBIOTIC ADVANCED PO) Take by mouth daily.     rosuvastatin (CRESTOR) 5 MG tablet TAKE 1 TABLET EVERY DAY 90 tablet 3   traZODone (DESYREL) 100 MG tablet TAKE 1 TABLET AT BEDTIME 90 tablet 3   No current facility-administered medications for this visit.    Allergies-reviewed and updated Allergies  Allergen Reactions   Erythromycin Swelling   Sulfa Antibiotics    Sulfonamide Derivatives Nausea Only    Social History   Social History Narrative   Married. 2 children (son is special needs-in group home living). 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      Hobbies: reading, ipad, former bowling a lot   Objective  Objective:  BP 110/64   Pulse 77   Temp 98.1 F (36.7 C)   Ht 5\' 2"  (1.575 m)   Wt 157 lb 12.8 oz (71.6 kg)   SpO2 99%   BMI 28.86 kg/m  Gen: NAD, resting comfortably HEENT: Mucous membranes are moist. Oropharynx normal Neck: no thyromegaly CV: RRR no murmurs rubs or gallops Lungs: CTAB no crackles, wheeze, rhonchi Abdomen: soft/nontender/nondistended/normal bowel sounds. No rebound or guarding.  Ext: no edema Skin: warm, dry Neuro: grossly normal, moves all extremities, PERRLA   Assessment and Plan   72 y.o. female presenting for annual physical.  Health Maintenance counseling: 1. Anticipatory guidance: Patient counseled regarding regular dental exams -q6 months, eye exams - yearly,  avoiding smoking and second hand smoke , limiting alcohol to 1 beverage per day- does not drink , no illicit drugs .   2. Risk factor reduction:  Advised patient of need for regular exercise and diet rich and fruits and vegetables to reduce risk of heart attack and stroke.  Exercise- nothing at the moment- has stationary bike available and has used intermittently in the past. Discouraged by the  meniere's. Had done [facility] with family member in past- encouraged to restart Diet/weight management-down 2 lbs from last year- trying to eat reasonably healthy.  Wt Readings from Last 3 Encounters:  08/02/22 157 lb 12.8 oz (71.6 kg)  06/14/22 159 lb 8 oz (72.3 kg)  06/01/22 160 lb 4 oz (72.7 kg)  3. Immunizations/screenings/ancillary studies-recommend fall flu shot, declines further COVID-19 vaccinations  Immunization History  Administered Date(s) Administered   Fluad Quad(high Dose 65+) 09/13/2018, 10/28/2019  Influenza Split 11/26/2010, 12/12/2011   Influenza Whole 10/18/2006, 11/10/2008, 11/24/2009   Influenza, High Dose Seasonal PF 11/27/2015   Influenza,inj,Quad PF,6+ Mos 12/12/2012, 10/29/2013, 12/03/2014, 10/31/2020   Influenza-Unspecified 10/22/2016, 09/18/2017, 10/21/2021   PFIZER(Purple Top)SARS-COV-2 Vaccination 03/02/2019, 03/24/2019, 10/28/2019   Pneumococcal Conjugate-13 12/03/2014   Pneumococcal Polysaccharide-23 12/12/2011, 01/06/2017   Td 01/18/2000   Tdap 11/26/2010   Zoster Recombinant(Shingrix) 09/10/2017, 11/10/2017   Zoster, Live 04/09/2012  4. Cervical cancer screening- still sees gynecology and decisions are made with them on Pap smears-technically past age based screening recommendations 5. Breast cancer screening-  breast exam with gynecology and mammogram 06/14/2022  and does yearly 6. Colon cancer screening -04/15/2022 with 3-year repeat planned   7. Skin cancer screening-most recently seen by dermatology yearly -Dr. Roderic Scarce. advised regular sunscreen use. Denies worrisome, changing, or new skin lesions.  8. Birth control/STD check-postmenopausal and monogamous   9. Osteoporosis screening at 65-see discussion below  10. Smoking associated screening -never smoker   Status of chronic or acute concerns   #constipation- has miralax available but intermittent- could try every other day- already stays well hydrated . Using stool softener already.  #neck  pain- had seen sports medicine - also worked with physical therapy- but reports pain not significantly better  #hyperlipidemia-coronary artery calcium score of 1 in 2022 #Aortic atherosclerosis S: Medication: Fenofibrate 160 mg, rosuvastatin 5 mg daily Lab Results  Component Value Date   CHOL 166 07/29/2021   HDL 82.10 07/29/2021   LDLCALC 69 07/29/2021   LDLDIRECT 66.0 01/29/2021   TRIG 73.0 07/29/2021   CHOLHDL 2 07/29/2021   A/P: hopefully stable- update lipid panel today. Continue current meds for now   #Chronic kidney disease stage III S: GFR is typically in the 50s range but has been higher at times -Patient knows to avoid NSAIDs  A/P: hopefully stable- update cmp today. Continue without meds for now    # GERD S:Medication: Prilosec 20 mg- rarely has to use 2nd A/P: stable- continue current medicines    # Depression #Insomnia S: Medication: Pristiq 50 mg for depression, trazodone 100 mg and melatonin combination for sleep in the past and have discussed possible Belsomra as well as done sleep hygiene counseling     08/02/2022   10:36 AM 06/14/2022    2:56 PM 01/31/2022    9:06 AM  Depression screen PHQ 2/9  Decreased Interest 1 0 0  Down, Depressed, Hopeless 1 0 0  PHQ - 2 Score 2 0 0  Altered sleeping 0  1  Tired, decreased energy 1  1  Change in appetite 0  1  Feeling bad or failure about yourself  0  0  Trouble concentrating 0  0  Moving slowly or fidgety/restless 0  0  Suicidal thoughts 0  0  PHQ-9 Score 3  3  Difficult doing work/chores Not difficult at all  Not difficult at all  A/P: full remission of depression- continue current medications    #Mnire's disease S: 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  Has been on Maxide in the past but ENT later took her off medication.  She no longer sees ENT - has worsened in last month- had visit with Dr. Suszanne Conners A/P: ongoing roaring sound is bothersome- working with Dr. Suszanne Conners but no great solutions for this-  tweaked hearing aide some    # Low Bone density (formerly osteopenia) S: Last DEXA: 06/11/2020 with Dr. Adalberto Ill worst T score -1.2. reports had another one done this year-  stbale thankfully  Calcium: 1200mg  (through diet ok) recommended - taking Vitamin D: 1000 units a day recommended-taking -Weightbearing exercise encouraged again  A/P: stable- continue current medicines    #Isolated leukopenia-intermittently-we will trend CBC and if worsening or other cell lines affected consider further work-up.  Has seen Dr. Candise Che in the past in 2018-he thought PPI could be contributing-LDH, B12, TSH, folate, hepatitis C were normal Lab Results  Component Value Date   WBC 3.2 (L) 01/31/2022   HGB 14.0 01/31/2022   HCT 41.8 01/31/2022   MCV 88.6 01/31/2022   PLT 277.0 01/31/2022   #History hematuria-prior evaluation by Dr. Marlou Porch for gross hematuria-cystoscopy and CT in the past.  We will get a urine with labs annually   #OAB 07/29/20- trial myrbetriq- very expensive but helpful   Recommended follow up: Return in about 6 months (around 02/02/2023) for followup or sooner if needed.Schedule b4 you leave. Future Appointments  Date Time Provider Department Center  06/20/2023  2:15 PM [facility] ANNUAL WELLNESS VISIT 1 [facility]   Lab/Order associations: fasting   ICD-10-CM   1. Preventative health care  Z00.00     2. Hyperlipidemia, unspecified hyperlipidemia type  E78.5     3. Major depressive disorder with single episode, in full remission (HCC)  F32.5     4. Stage 3 chronic kidney disease, unspecified whether stage 3a or 3b CKD (HCC)  N18.30       No orders of the defined types were placed in this encounter.   Return precautions advised.  Tana Conch, MD

## 2022-08-08 ENCOUNTER — Telehealth: Payer: Self-pay | Admitting: Family Medicine

## 2022-08-08 MED ORDER — FENOFIBRATE 160 MG PO TABS: 160.0000 mg | ORAL_TABLET | Freq: Every day | ORAL | 3 refills | Status: DC

## 2022-08-08 MED ORDER — TRAZODONE HCL 100 MG PO TABS: 100.0000 mg | ORAL_TABLET | Freq: Every day | ORAL | 3 refills | Status: DC

## 2022-08-08 MED ORDER — MONTELUKAST SODIUM 10 MG PO TABS: 10.0000 mg | ORAL_TABLET | Freq: Every day | ORAL | 3 refills | Status: DC

## 2022-08-08 MED ORDER — OMEPRAZOLE 20 MG PO CPDR: 20.0000 mg | DELAYED_RELEASE_CAPSULE | Freq: Every day | ORAL | 3 refills | Status: DC

## 2022-08-08 MED ORDER — DESVENLAFAXINE SUCCINATE ER 50 MG PO TB24: 50.0000 mg | ORAL_TABLET | Freq: Every day | ORAL | 3 refills | Status: DC

## 2022-08-08 MED ORDER — LEVOCETIRIZINE DIHYDROCHLORIDE 5 MG PO TABS: 5.0000 mg | ORAL_TABLET | Freq: Every evening | ORAL | 3 refills | Status: DC

## 2022-08-08 MED ORDER — ROSUVASTATIN CALCIUM 5 MG PO TABS: 5.0000 mg | ORAL_TABLET | Freq: Every day | ORAL | 3 refills | Status: DC

## 2022-08-08 MED ORDER — MIRABEGRON ER 25 MG PO TB24: 25.0000 mg | ORAL_TABLET | Freq: Every day | ORAL | 3 refills | Status: DC

## 2022-08-08 NOTE — Telephone Encounter (Signed)
Refills sent to requested pharmacy. 

## 2022-08-08 NOTE — Telephone Encounter (Signed)
Prescription Request  Patient requests 90 day supplies for all  of the medications listed below (to last 1 year)  08/08/2022  LOV: 08/02/2022  What is the name of the medication or equipment? mirabegron ER (MYRBETRIQ) 25 MG TB24 tablet  AND  traZODone (DESYREL) 100 MG tablet  AND omeprazole (PRILOSEC) 20 MG capsule  AND montelukast (SINGULAIR) 10 MG tablet  AND fenofibrate 160 MG tablet  AND levocetirizine (XYZAL) 5 MG tablet  AND desvenlafaxine (PRISTIQ) 50 MG 24 hr tablet  AND rosuvastatin (CRESTOR) 5 MG tablet   Have you contacted your pharmacy to request a refill? Yes   Which pharmacy would you like this sent to?    [facility] - [place], [place] - [address] [place] [address] Phone: [phone] Fax: [phone]     Patient notified that their request is being sent to the clinical staff for review and that they should receive a response within 2 business days.   Please advise at Mobile [phone] (mobile)

## 2022-12-05 ENCOUNTER — Telehealth: Payer: Self-pay | Admitting: Family Medicine

## 2022-12-05 NOTE — Telephone Encounter (Signed)
Pt has an appointment tomorrow. She is needing to know what she can take over the counter for pain until then. Please advise

## 2022-12-05 NOTE — Telephone Encounter (Signed)
Since she has some kidney disease-likely best to stick with Tylenol or topical rubs or other topical things like a lidocaine patch (as long as no allergy)

## 2022-12-05 NOTE — Telephone Encounter (Signed)
Called and spoke with pt and below message given.

## 2022-12-05 NOTE — Telephone Encounter (Signed)
See below, appt tomorrow is for nerve pain.

## 2022-12-06 ENCOUNTER — Encounter: Payer: Self-pay | Admitting: Family Medicine

## 2022-12-06 ENCOUNTER — Ambulatory Visit (INDEPENDENT_AMBULATORY_CARE_PROVIDER_SITE_OTHER): Payer: Medicare HMO | Admitting: Family Medicine

## 2022-12-06 VITALS — BP 132/84 | HR 70 | Temp 97.5°F | Ht 62.0 in | Wt 156.0 lb

## 2022-12-06 DIAGNOSIS — M47812 Spondylosis without myelopathy or radiculopathy, cervical region: Secondary | ICD-10-CM | POA: Diagnosis not present

## 2022-12-06 DIAGNOSIS — M5441 Lumbago with sciatica, right side: Secondary | ICD-10-CM

## 2022-12-06 DIAGNOSIS — N183 Chronic kidney disease, stage 3 unspecified: Secondary | ICD-10-CM | POA: Diagnosis not present

## 2022-12-06 MED ORDER — PREDNISONE 20 MG PO TABS: ORAL_TABLET | ORAL | 0 refills | Status: DC

## 2022-12-06 NOTE — Patient Instructions (Addendum)
Right buttocks/hip/leg pain consistent with radicular pain possibly from S1 nerve root irritation- no red flags. We will trial prednisone for 7 days- and if not at least 50% better within 10-14 days refer to sports medicine for their opinion. Sooner if worsens or let me know if other new symptoms.    Recommended follow up: Return for as needed for new, worsening, persistent symptoms.

## 2022-12-06 NOTE — Progress Notes (Signed)
Phone [phone] In person visit   Subjective:   Kristin Bonilla is a 72 y.o. year old very pleasant female patient who presents for/with See problem oriented charting Chief Complaint  Patient presents with   Hyperlipidemia   Depression   Leg Pain    Rt leg pain that started Saturday, pain radiates down to ankle - pt has tried Tylenol that did not really help   Ear Pain    Pt states that she has pain in back of neck that radiates to ears, has had this pain for years     Past Medical History-  Patient Active Problem List   Diagnosis Date Noted   Overactive bladder 07/29/2021    Priority: Medium    Aortic atherosclerosis (HCC) 08/22/2020    Priority: Medium    Leukopenia 05/29/2014    Priority: Medium    CKD (chronic kidney disease), stage III (HCC) 12/20/2013    Priority: Medium    Meniere disease 12/20/2013    Priority: Medium    Major depression in full remission (HCC) 06/07/2007    Priority: Medium    Hyperlipidemia 10/25/2006    Priority: Medium    Allergic rhinitis 07/18/2017    Priority: Low   Osteopenia 12/03/2014    Priority: Low   Insomnia 05/29/2014    Priority: Low   Alopecia 04/27/2009    Priority: Low   GERD (gastroesophageal reflux disease) 01/09/2008    Priority: Low   Irritable bowel syndrome 01/09/2008    Priority: Low   Viral upper respiratory tract infection 12/14/2020   Hematuria, microscopic 10/08/2018   Cataracts, bilateral 06/14/2018   Nuclear sclerosis 06/14/2018    Medications- reviewed and updated Current Outpatient Medications  Medication Sig Dispense Refill   acetaminophen (TYLENOL) 500 MG tablet Take 1,000 mg by mouth daily as needed for mild pain or headache.     albuterol (VENTOLIN HFA) 108 (90 Base) MCG/ACT inhaler Inhale 2 puffs into the lungs every 6 (six) hours as needed for wheezing or shortness of breath. Inhale 1 puff, hold for a few seconds, wait a moment, then inhale 2nd puff. 8 g 2   Albuterol-Budesonide (AIRSUPRA)  90-80 MCG/ACT AERO Inhale 1 puff into the lungs every 6 (six) hours as needed (as needed for shortness of breath). 10.7 g 1   aspirin EC 81 MG tablet Take 81 mg by mouth daily.     Biotin 5000 MCG CAPS Take 5,000 mcg by mouth daily.     Calcium Carbonate-Vit D-Min (CALCIUM 1200 PO) Take 1 tablet by mouth daily.     desvenlafaxine (PRISTIQ) 50 MG 24 hr tablet Take 1 tablet (50 mg total) by mouth daily. 90 tablet 3   fenofibrate 160 MG tablet Take 1 tablet (160 mg total) by mouth daily. 90 tablet 3   levocetirizine (XYZAL) 5 MG tablet Take 1 tablet (5 mg total) by mouth every evening. 90 tablet 3   Melatonin 5 MG TABS Take 1 tablet by mouth at bedtime.     mirabegron ER (MYRBETRIQ) 25 MG TB24 tablet Take 1 tablet (25 mg total) by mouth daily. 90 tablet 3   montelukast (SINGULAIR) 10 MG tablet Take 1 tablet (10 mg total) by mouth at bedtime. 90 tablet 3   Multiple Vitamin (MULTIVITAMIN) tablet Take 1 tablet by mouth daily.     Omega-3 Fatty Acids (FISH OIL BURP-LESS PO) Take 1 capsule by mouth daily.     omeprazole (PRILOSEC) 20 MG capsule Take 1-2 capsules (20-40 mg total) by mouth  daily. 180 capsule 3   polyethylene glycol powder (GLYCOLAX/MIRALAX) 17 GM/SCOOP powder Take 17 g by mouth daily as needed for mild constipation.     predniSONE (DELTASONE) 20 MG tablet Take 2 pills for 3 days, 1 pill for 4 days 10 tablet 0   Probiotic Product (PROBIOTIC ADVANCED PO) Take by mouth daily.     rosuvastatin (CRESTOR) 5 MG tablet Take 1 tablet (5 mg total) by mouth daily. 90 tablet 3   traZODone (DESYREL) 100 MG tablet Take 1 tablet (100 mg total) by mouth at bedtime. 90 tablet 3   benzonatate (TESSALON PERLES) 100 MG capsule Take 1 capsule (100 mg total) by mouth 3 (three) times daily as needed for cough. (Patient not taking: Reported on 12/06/2022) 30 capsule 1   No current facility-administered medications for this visit.     Objective:  BP 132/84   Pulse 70   Temp (!) 97.5 F (36.4 C)   Ht 5\' 2"   (1.575 m)   SpO2 98%   BMI 28.86 kg/m  Gen: NAD, resting comfortably CV: RRR no murmurs rubs or gallops Lungs: CTAB no crackles, wheeze, rhonchi Abdomen: soft/nontender/nondistended/normal bowel sounds. No rebound or guarding.  Ext: no edema Skin: warm, dry Back - Normal skin, Spine with normal alignment and no deformity.  No tenderness to vertebral process palpation.  Paraspinous muscles are not tender and without spasm.   Range of motion is full at neck and lumbar sacral regions. Positive Straight leg raise on the right for pain.  Does get some pain into the back with hip motion and with FABER but no pain over SI joint Neuro- no saddle anesthesia, 5/5 strength lower extremities     Assessment and Plan   # Right buttocks/hip/leg pain S: Patient reports right buttocks, right low back pain that radiates into back of right leg and radiates down to her ankle more laterally. Up to 8/10 pain at its peak. Could be a stretch or a burn sensation.  -Started on Saturday about 3 days ago -Worse with sitting Relieved by-getting up and walking - mild relief Previous Treatment-Tylenol but was not very helpful -Has CKD stage III so she tries to avoid NSAIDs Previous imaging on our system-No history of lumbar spine imaging or of hip on that side  ROS-No saddle anesthesia, bladder incontinence, fecal incontinence, weakness in extremity, numbness or tingling in extremity. History negative for trauma, history of cancer, fever, chills, unintentional weight loss, recent bacterial infection, recent IV drug use, HIV, pain worse at night or while supine.   A/P: Right buttocks/hip/leg pain consistent with radicular pain possibly from S1 nerve root irritation- no red flags. We will trial prednisone for 7 days- and if not at least 50% better within 10-14 days refer to sports medicine for their opinion. Sooner if worsens or let me know if other new symptoms.   -discussed prednisone may increase appetite/cause weight  gain/increase sugars/increase blood pressure/cause irritability amongst other issues -with CKD III avoid NSAIDs (has been well controlled )  -may see chiropractor  # Neck pain in patient with cervical arthritis S:Medication: none  -Patient reports neck pain that can radiate to both ears.  Reports she has had this for years.  Denies recent worsening  -Saw Dr. Jean Rosenthal 08/04/2021 and had x-rays of the cervical spine which showed no recent fracture with cervical spondylosis and possible encroachment of neural foramina from C3-C7 - sat with grandson on Wednesday of last week who later diagnosed with pneumonia  - reports mild  drainage since that time A/P: as far as neck pain/ear pain- ears were normal on exam. Suspect neck pain from ongoing cervical arthritis- may get mild benefit from using the prednisone as above - as far as her throat irritation- suspect mild viral illness- no obvious bacterial infection- no fever or other signs/contacts to suspect strep   Recommended follow up: Return for as needed for new, worsening, persistent symptoms. Future Appointments  Date Time Provider Department Center  02/02/2023 11:00 AM Shelva Majestic, MD [facility]  06/20/2023  2:15 PM [facility] ANNUAL WELLNESS VISIT 1 [facility]  08/04/2023 10:20 AM [facility] None    Lab/Order associations:   ICD-10-CM   1. Acute right-sided low back pain with right-sided sciatica  M54.41     2. Cervical arthritis  M47.812     3. Stage 3 chronic kidney disease, unspecified whether stage 3a or 3b CKD (HCC)  N18.30       Meds ordered this encounter  Medications   predniSONE (DELTASONE) 20 MG tablet    Sig: Take 2 pills for 3 days, 1 pill for 4 days    Dispense:  10 tablet    Refill:  0    Return precautions advised.  Tana Conch, MD

## 2023-01-08 ENCOUNTER — Encounter: Payer: Self-pay | Admitting: Family Medicine

## 2023-01-09 ENCOUNTER — Ambulatory Visit (INDEPENDENT_AMBULATORY_CARE_PROVIDER_SITE_OTHER): Payer: Medicare HMO | Admitting: Urgent Care

## 2023-01-09 VITALS — BP 116/75 | HR 87 | Temp 98.7°F | Wt 158.8 lb

## 2023-01-09 DIAGNOSIS — J069 Acute upper respiratory infection, unspecified: Secondary | ICD-10-CM | POA: Diagnosis not present

## 2023-01-09 MED ORDER — IPRATROPIUM BROMIDE 0.03 % NA SOLN
2.0000 | Freq: Two times a day (BID) | NASAL | 12 refills | Status: AC | PRN
Start: 2023-01-09 — End: ?

## 2023-01-09 MED ORDER — BENZONATATE 200 MG PO CAPS
200.0000 mg | ORAL_CAPSULE | Freq: Two times a day (BID) | ORAL | 0 refills | Status: DC | PRN
Start: 2023-01-09 — End: 2023-02-02

## 2023-01-09 MED ORDER — PREDNISONE 10 MG (21) PO TBPK: ORAL_TABLET | Freq: Every day | ORAL | 0 refills | Status: DC

## 2023-01-09 NOTE — Progress Notes (Unsigned)
Established Patient Office Visit  Subjective:  Patient ID: Kristin Bonilla, female    DOB: 04-May-1950  Age: 72 y.o. MRN: [id number]  Chief Complaint  Patient presents with   Cough    Cough that started yesterday. Husband was seen at Urgent care Saturday for same issue and was diagnosed with bronchitis.She states her lymph nodes feel swollen.     Pleasant 72yo female with a history of Meniere's disease, presents with a chief complaint of a severe cough that started the previous day. The cough, described as 'dry for the most part,' has been persistent and intense, leading to chest fatigue. The patient denies any history of chronic lung conditions such as COPD or asthma but mentions a propensity to develop bronchitis. She has not had pneumonia in the past.  The patient's husband was recently diagnosed with bronchitis, and the patient was exposed to him during this time. The patient has had a mild fever, with the highest recorded temperature being 100 degrees. Over-the-counter cough pearls have been used for symptom management, with some relief.  The patient also reports a worsening of her Meniere's disease symptoms over the summer, with increased noise levels and the onset of music hallucinations approximately two weeks ago. She is currently on several medications for sinus issues chronically.  The patient expresses a desire to recover quickly to celebrate the holidays and expresses concern about potentially spreading the illness to her special needs son and his group home.   Cough    Patient Active Problem List   Diagnosis Date Noted   Overactive bladder 07/29/2021   Viral upper respiratory tract infection 12/14/2020   Aortic atherosclerosis (HCC) 08/22/2020   Hematuria, microscopic 10/08/2018   Cataracts, bilateral 06/14/2018   Nuclear sclerosis 06/14/2018   Allergic rhinitis 07/18/2017   Osteopenia 12/03/2014   Leukopenia 05/29/2014   Insomnia 05/29/2014   CKD (chronic kidney  disease), stage III (HCC) 12/20/2013   Meniere disease 12/20/2013   Alopecia 04/27/2009   GERD (gastroesophageal reflux disease) 01/09/2008   Irritable bowel syndrome 01/09/2008   Major depression in full remission (HCC) 06/07/2007   Hyperlipidemia 10/25/2006   Past Medical History:  Diagnosis Date   Allergy 1983   Seasonal   Arthritis    Cataracts, bilateral 06/14/2018   Chronic kidney disease    Stage 3   GERD (gastroesophageal reflux disease)    Hyperlipidemia    Internal thrombosed hemorrhoids 01/09/2008   Meniere disease    Menopause    Nuclear sclerosis 06/14/2018   Social History   Tobacco Use   Smoking status: Never   Smokeless tobacco: Never  Vaping Use   Vaping status: Never Used  Substance Use Topics   Alcohol use: No    Alcohol/week: 0.0 standard drinks of alcohol   Drug use: No      ROS: as noted in HPI  Objective:     BP 116/75   Pulse 87   Temp 98.7 F (37.1 C) (Oral)   Wt 158 lb 12.8 oz (72 kg)   SpO2 96%   BMI 29.04 kg/m  BP Readings from Last 3 Encounters:  01/09/23 116/75  12/06/22 132/84  08/02/22 110/64   Wt Readings from Last 3 Encounters:  01/09/23 158 lb 12.8 oz (72 kg)  12/06/22 156 lb (70.8 kg)  08/02/22 157 lb 12.8 oz (71.6 kg)      Physical Exam Vitals and nursing note reviewed.  Constitutional:      General: She is not in acute distress.  Appearance: Normal appearance. She is normal weight. She is not ill-appearing, toxic-appearing or diaphoretic.  HENT:     Head: Normocephalic and atraumatic.     Right Ear: Ear canal and external ear normal. No swelling or tenderness. No middle ear effusion. There is no impacted cerumen. Tympanic membrane is scarred (opacified). Tympanic membrane is not injected, perforated, erythematous or bulging.     Left Ear: Ear canal and external ear normal. No swelling or tenderness.  No middle ear effusion. There is no impacted cerumen. Tympanic membrane is scarred (opacified). Tympanic  membrane is not injected, perforated, erythematous or bulging.     Nose: Rhinorrhea present. Rhinorrhea is clear.     Right Turbinates: Not enlarged or swollen.     Left Turbinates: Not enlarged or swollen.     Right Sinus: No maxillary sinus tenderness or frontal sinus tenderness.     Left Sinus: No maxillary sinus tenderness or frontal sinus tenderness.     Mouth/Throat:     Lips: Pink.     Pharynx: Oropharynx is clear. Uvula midline. No pharyngeal swelling, oropharyngeal exudate, posterior oropharyngeal erythema, uvula swelling or postnasal drip.  Cardiovascular:     Rate and Rhythm: Normal rate and regular rhythm.  Pulmonary:     Effort: Pulmonary effort is normal. No respiratory distress.     Breath sounds: Normal breath sounds. No stridor. No rhonchi or rales.  Chest:     Chest wall: No tenderness.  Musculoskeletal:     Cervical back: Normal range of motion and neck supple. No rigidity or tenderness.  Lymphadenopathy:     Cervical: No cervical adenopathy.  Skin:    General: Skin is warm and dry.     Coloration: Skin is not jaundiced.     Findings: No bruising, erythema or rash.  Neurological:     General: No focal deficit present.     Mental Status: She is alert and oriented to person, place, and time.     Sensory: No sensory deficit.     Motor: No weakness.      No results found for any visits on 01/09/23.    The 10-year ASCVD risk score (Arnett DK, et al., 2019) is: 10.4%  Assessment & Plan:  Viral upper respiratory tract infection -     predniSONE; Take by mouth daily. Take 6 tabs by mouth daily  for 1 days, then 5 tabs for 1 days, then 4 tabs for 1 days, then 3 tabs for 1 days, 2 tabs for 1 days, then 1 tab by mouth daily for 1 days  Dispense: 21 tablet; Refill: 0 -     Benzonatate; Take 1 capsule (200 mg total) by mouth 2 (two) times daily as needed for cough.  Dispense: 20 capsule; Refill: 0 -     Ipratropium Bromide; Place 2 sprays into both nostrils 2 (two)  times daily as needed for rhinitis.  Dispense: 30 mL; Refill: 12  Assessment & Plan Acute Bronchitis Recent onset of severe coughing, fatigue, and exposure to husband with diagnosed bronchitis. No history of COPD, asthma, or emphysema. Temp normal in office without use of antipyretics. Mostly dry cough. VSS, lungs CTA. -Start Prednisone to reduce inflammation and cough. -Continue Montelukast at night to prevent excess mucus buildup. -use of nasal decongestant (atrovent) for postnasal drip. -Stronger dose of cough pearls (200mg ) since current ones are not as effective. -Advise hydration and use of menthol (like Vicks) to open up upper airway. -Advise coughing into arm, frequent hand washing, and  maintaining distance when coughing. -Aim to control cough to reduce risk of spreading infection.  No follow-ups on file.   Maretta Bees, PA

## 2023-01-09 NOTE — Patient Instructions (Signed)
Your symptoms are consistent with a viral upper respiratory tract infection.  Start taking prednisone once daily to help clear up the mucous; take per taper pack directions.  Use Atrovent nasal spray daily to help with inflammation of the nasal passage. Take until symptoms resolve.  It is also recommended that you use nasal saline/ sinus washes to cleans the sinus passages. Hot steam from a shower or vaporizer may also be beneficial to help open up the upper airway. Eucalyptus can be helpful.  Continue taking nightly montelukast and xyzal as prescribed by your PCP.  Use the benzonatate twice daily as needed for coughing.  OTC Quercetin 500mg  with Zinc 50mg  helps boost your immune system. This may be helpful.  If any worsening symptoms such as headache, fever, or shortness of breath, please go to urgent care.

## 2023-01-10 ENCOUNTER — Encounter: Payer: Self-pay | Admitting: Urgent Care

## 2023-01-10 ENCOUNTER — Ambulatory Visit: Payer: Medicare HMO | Admitting: Physician Assistant

## 2023-01-17 ENCOUNTER — Ambulatory Visit: Payer: Medicare HMO | Admitting: Family Medicine

## 2023-02-02 ENCOUNTER — Ambulatory Visit (INDEPENDENT_AMBULATORY_CARE_PROVIDER_SITE_OTHER): Payer: Medicare Other | Admitting: Family Medicine

## 2023-02-02 ENCOUNTER — Encounter: Payer: Self-pay | Admitting: Family Medicine

## 2023-02-02 VITALS — BP 120/70 | HR 79 | Temp 97.3°F | Ht 62.0 in | Wt 157.2 lb

## 2023-02-02 DIAGNOSIS — I700 Atherosclerosis of aorta: Secondary | ICD-10-CM | POA: Diagnosis not present

## 2023-02-02 DIAGNOSIS — N183 Chronic kidney disease, stage 3 unspecified: Secondary | ICD-10-CM

## 2023-02-02 DIAGNOSIS — Z131 Encounter for screening for diabetes mellitus: Secondary | ICD-10-CM | POA: Diagnosis not present

## 2023-02-02 DIAGNOSIS — F325 Major depressive disorder, single episode, in full remission: Secondary | ICD-10-CM | POA: Diagnosis not present

## 2023-02-02 DIAGNOSIS — E663 Overweight: Secondary | ICD-10-CM | POA: Diagnosis not present

## 2023-02-02 DIAGNOSIS — D72819 Decreased white blood cell count, unspecified: Secondary | ICD-10-CM | POA: Diagnosis not present

## 2023-02-02 DIAGNOSIS — E785 Hyperlipidemia, unspecified: Secondary | ICD-10-CM | POA: Diagnosis not present

## 2023-02-02 LAB — CBC WITH DIFFERENTIAL/PLATELET
Basophils Absolute: 0.1 10*3/uL (ref 0.0–0.1)
Basophils Relative: 2.3 % (ref 0.0–3.0)
Eosinophils Absolute: 0.2 10*3/uL (ref 0.0–0.7)
Eosinophils Relative: 5 % (ref 0.0–5.0)
HCT: 41.7 % (ref 36.0–46.0)
Hemoglobin: 13.8 g/dL (ref 12.0–15.0)
Lymphocytes Relative: 41.8 % (ref 12.0–46.0)
Lymphs Abs: 1.5 10*3/uL (ref 0.7–4.0)
MCHC: 33 g/dL (ref 30.0–36.0)
MCV: 91.8 fL (ref 78.0–100.0)
Monocytes Absolute: 0.3 10*3/uL (ref 0.1–1.0)
Monocytes Relative: 9 % (ref 3.0–12.0)
Neutro Abs: 1.5 10*3/uL (ref 1.4–7.7)
Neutrophils Relative %: 41.9 % — ABNORMAL LOW (ref 43.0–77.0)
Platelets: 252 10*3/uL (ref 150.0–400.0)
RBC: 4.54 Mil/uL (ref 3.87–5.11)
RDW: 13.9 % (ref 11.5–15.5)
WBC: 3.7 10*3/uL — ABNORMAL LOW (ref 4.0–10.5)

## 2023-02-02 LAB — COMPREHENSIVE METABOLIC PANEL
ALT: 23 U/L (ref 0–35)
AST: 26 U/L (ref 0–37)
Albumin: 4.4 g/dL (ref 3.5–5.2)
Alkaline Phosphatase: 52 U/L (ref 39–117)
BUN: 26 mg/dL — ABNORMAL HIGH (ref 6–23)
CO2: 30 meq/L (ref 19–32)
Calcium: 10.3 mg/dL (ref 8.4–10.5)
Chloride: 104 meq/L (ref 96–112)
Creatinine, Ser: 0.98 mg/dL (ref 0.40–1.20)
GFR: 57.23 mL/min — ABNORMAL LOW (ref >60.00)
Glucose, Bld: 96 mg/dL (ref 70–99)
Potassium: 3.7 meq/L (ref 3.5–5.1)
Sodium: 142 meq/L (ref 135–145)
Total Bilirubin: 0.4 mg/dL (ref 0.2–1.2)
Total Protein: 6.9 g/dL (ref 6.0–8.3)

## 2023-02-02 LAB — HEMOGLOBIN A1C: Hgb A1c MFr Bld: 5.9 % (ref 4.6–6.5)

## 2023-02-02 NOTE — Patient Instructions (Addendum)
Please stop by lab before you go If you have mychart- we will send your results within 3 business days of [place] receiving them.  If you do not have mychart- we will call you about results within 5 business days of [place] receiving them.  *please also note that you will see labs on mychart as soon as they post. I will later go in and write notes on them- will say "notes from Dr. Durene Cal"   No changes today unless labs lead [place] to make changes  Recommended follow up: Return in about 6 months (around 08/02/2023) for physical or sooner if needed.Schedule b4 you leave.

## 2023-02-02 NOTE — Progress Notes (Signed)
Phone [phone] In person visit   Subjective:   Kristin Bonilla is a 73 y.o. year old very pleasant female patient who presents for/with See problem oriented charting Chief Complaint  Patient presents with   Medical Management of Chronic Issues   Hyperlipidemia    Past Medical History-  Patient Active Problem List   Diagnosis Date Noted   Overactive bladder 07/29/2021    Priority: Medium    Aortic atherosclerosis (HCC) 08/22/2020    Priority: Medium    Leukopenia 05/29/2014    Priority: Medium    CKD (chronic kidney disease), stage III (HCC) 12/20/2013    Priority: Medium    Meniere disease 12/20/2013    Priority: Medium    Major depression in full remission (HCC) 06/07/2007    Priority: Medium    Hyperlipidemia 10/25/2006    Priority: Medium    Allergic rhinitis 07/18/2017    Priority: Low   Osteopenia 12/03/2014    Priority: Low   Insomnia 05/29/2014    Priority: Low   Alopecia 04/27/2009    Priority: Low   GERD (gastroesophageal reflux disease) 01/09/2008    Priority: Low   Irritable bowel syndrome 01/09/2008    Priority: Low   Viral upper respiratory tract infection 12/14/2020   Hematuria, microscopic 10/08/2018   Cataracts, bilateral 06/14/2018   Nuclear sclerosis 06/14/2018    Medications- reviewed and updated Current Outpatient Medications  Medication Sig Dispense Refill   acetaminophen (TYLENOL) 500 MG tablet Take 1,000 mg by mouth daily as needed for mild pain or headache.     albuterol (VENTOLIN HFA) 108 (90 Base) MCG/ACT inhaler Inhale 2 puffs into the lungs every 6 (six) hours as needed for wheezing or shortness of breath. Inhale 1 puff, hold for a few seconds, wait a moment, then inhale 2nd puff. 8 g 2   Albuterol-Budesonide (AIRSUPRA) 90-80 MCG/ACT AERO Inhale 1 puff into the lungs every 6 (six) hours as needed (as needed for shortness of breath). 10.7 g 1   aspirin EC 81 MG tablet Take 81 mg by mouth daily.     Biotin 5000 MCG CAPS Take 5,000  mcg by mouth daily.     Calcium Carbonate-Vit D-Min (CALCIUM 1200 PO) Take 1 tablet by mouth daily.     desvenlafaxine (PRISTIQ) 50 MG 24 hr tablet Take 1 tablet (50 mg total) by mouth daily. 90 tablet 3   fenofibrate 160 MG tablet Take 1 tablet (160 mg total) by mouth daily. 90 tablet 3   ipratropium (ATROVENT) 0.03 % nasal spray Place 2 sprays into both nostrils 2 (two) times daily as needed for rhinitis. 30 mL 12   levocetirizine (XYZAL) 5 MG tablet Take 1 tablet (5 mg total) by mouth every evening. 90 tablet 3   Melatonin 5 MG TABS Take 1 tablet by mouth at bedtime.     mirabegron ER (MYRBETRIQ) 25 MG TB24 tablet Take 1 tablet (25 mg total) by mouth daily. 90 tablet 3   montelukast (SINGULAIR) 10 MG tablet Take 1 tablet (10 mg total) by mouth at bedtime. 90 tablet 3   Multiple Vitamin (MULTIVITAMIN) tablet Take 1 tablet by mouth daily.     Omega-3 Fatty Acids (FISH OIL BURP-LESS PO) Take 1 capsule by mouth daily.     omeprazole (PRILOSEC) 20 MG capsule Take 1-2 capsules (20-40 mg total) by mouth daily. 180 capsule 3   polyethylene glycol powder (GLYCOLAX/MIRALAX) 17 GM/SCOOP powder Take 17 g by mouth daily as needed for mild constipation.  Probiotic Product (PROBIOTIC ADVANCED PO) Take by mouth daily.     rosuvastatin (CRESTOR) 5 MG tablet Take 1 tablet (5 mg total) by mouth daily. 90 tablet 3   traZODone (DESYREL) 100 MG tablet Take 1 tablet (100 mg total) by mouth at bedtime. 90 tablet 3   No current facility-administered medications for this visit.     Objective:  BP 120/70   Pulse 79   Temp (!) 97.3 F (36.3 C)   Ht 5\' 2"  (1.575 m)   Wt 157 lb 3.2 oz (71.3 kg)   SpO2 96%   BMI 28.75 kg/m  Gen: NAD, resting comfortably CV: RRR no murmurs rubs or gallops Lungs: CTAB no crackles, wheeze, rhonchi Ext: no edema Skin: warm, dry     Assessment and Plan   # Right sided low back pain-seen in November with likely S1 nerve root irritation and no red flags-we tried prednisone  for 7 days and she was considering seeing a chiropractor Dr. Javier Docker to hold off on sports medicine orthopedics at that time unless fails to improve-today she reports only mild lingering issues with intermittent flares . She did not see chiropractor.  -Was also having some neck pain likely from cervical arthritis and we were hoping this would be beneficial- not as much   #hyperlipidemia-coronary artery calcium score of 1 in 2022 #Aortic atherosclerosis S: Medication: Fenofibrate 160 mg, rosuvastatin 5 mg daily Lab Results  Component Value Date   CHOL 165 08/02/2022   HDL 70.10 08/02/2022   LDLCALC 77 08/02/2022   LDLDIRECT 66.0 01/29/2021   TRIG 90.0 08/02/2022   CHOLHDL 2 08/02/2022   A/P: very close to ideal goals even for aortic atherosclerosis  Aortic atherosclerosis (presumed stable)- LDL goal ideally <70 - recheck in 6 months and continue current medications  -consider repeat CT cardiac scoring 2027  #Chronic kidney disease stage III S: GFR is typically in the 50s range but has been higher at times -Patient knows to avoid NSAIDs  A/P: CKD II hopefully stable- update cmp today. Continue without meds for now  . Uses tylenol only  # GERD S:Medication: Prilosec 20 mg- rarely has to use 2nd still- bending over is a trigger A/P: stable- continue current medicines    # Depression #Insomnia S: Medication: Pristiq 50 mg for depression, trazodone 100mg  and melatonin combination for sleep      12/06/2022    8:50 AM 08/02/2022   10:36 AM 06/14/2022    2:56 PM  Depression screen PHQ 2/9  Decreased Interest 0 1 0  Down, Depressed, Hopeless 0 1 0  PHQ - 2 Score 0 2 0  Altered sleeping 1 0   Tired, decreased energy 1 1   Change in appetite 0 0   Feeling bad or failure about yourself  0 0   Trouble concentrating 0 0   Moving slowly or fidgety/restless 0 0   Suicidal thoughts 0 0   PHQ-9 Score 2 3   Difficult doing work/chores Not difficult at all Not difficult at all   A/P:  depression in full remission- continue current medications   #Mnire's disease - ongoing issues but stable- continue to monitor and minimizes salt. Last seen by Dr. Suszanne Conners August 2024   # Low Bone density (formerly osteopenia)- Last DEXA: 06/11/2020 with Dr. Adalberto Ill worst T score -1.2 and reported repeat in 2024 last visit- not as sure now- she will check next visit  #Isolated leukopenia-intermittently-we will trend CBC and if worsening or other cell lines affected  consider further work-up.  Has seen Dr. Candise Che in the past in 2018-he thought PPI could be contributing-LDH, B12, TSH, folate, hepatitis C were normal  #OAB 07/29/20- trial myrbetriq- changed to [facility] and has some cost but more affordable- helpful  # Constipation-intermittent issues-intermittently uses MiraLAX and we have discussed could use every other day if she remembers.  She does stay well-hydrated.  Use a stool softener daily   Recommended follow up: Return in about 6 months (around 08/02/2023) for physical or sooner if needed.Schedule b4 you leave. Future Appointments  Date Time Provider Department Center  06/20/2023  2:15 PM [facility] ANNUAL WELLNESS VISIT 1 [facility]  08/04/2023 10:20 AM [facility] None    Lab/Order associations:   ICD-10-CM   1. Hyperlipidemia, unspecified hyperlipidemia type  E78.5     2. Major depressive disorder with single episode, in full remission (HCC)  F32.5     3. Stage 3 chronic kidney disease, unspecified whether stage 3a or 3b CKD (HCC)  N18.30     4. Aortic atherosclerosis (HCC)  I70.0     5. Leukopenia, unspecified type  D72.819       No orders of the defined types were placed in this encounter.   Return precautions advised.  Tana Conch, MD

## 2023-02-04 ENCOUNTER — Encounter: Payer: Self-pay | Admitting: Family Medicine

## 2023-02-07 ENCOUNTER — Other Ambulatory Visit: Payer: Self-pay | Admitting: Family Medicine

## 2023-02-07 MED ORDER — FENOFIBRATE 160 MG PO TABS: 160.0000 mg | ORAL_TABLET | Freq: Every day | ORAL | 3 refills | Status: DC

## 2023-02-07 MED ORDER — ROSUVASTATIN CALCIUM 5 MG PO TABS: 5.0000 mg | ORAL_TABLET | Freq: Every day | ORAL | 3 refills | Status: DC

## 2023-02-07 MED ORDER — MIRABEGRON ER 25 MG PO TB24: 25.0000 mg | ORAL_TABLET | Freq: Every day | ORAL | 3 refills | Status: DC

## 2023-02-07 MED ORDER — DESVENLAFAXINE SUCCINATE ER 50 MG PO TB24: 50.0000 mg | ORAL_TABLET | Freq: Every day | ORAL | 3 refills | Status: DC

## 2023-02-07 MED ORDER — LEVOCETIRIZINE DIHYDROCHLORIDE 5 MG PO TABS: 5.0000 mg | ORAL_TABLET | Freq: Every evening | ORAL | 3 refills | Status: DC

## 2023-02-07 MED ORDER — OMEPRAZOLE 20 MG PO CPDR: 20.0000 mg | DELAYED_RELEASE_CAPSULE | Freq: Every day | ORAL | 3 refills | Status: DC

## 2023-02-07 MED ORDER — MONTELUKAST SODIUM 10 MG PO TABS: 10.0000 mg | ORAL_TABLET | Freq: Every day | ORAL | 3 refills | Status: DC

## 2023-02-07 MED ORDER — TRAZODONE HCL 100 MG PO TABS: 100.0000 mg | ORAL_TABLET | Freq: Every day | ORAL | 3 refills | Status: DC

## 2023-02-07 NOTE — Telephone Encounter (Signed)
Copied from CRM [id number]. Topic: Clinical - Medication Refill >> Feb 07, 2023 12:28 PM Pascal Lux wrote: Most Recent Primary Care Visit:  Provider: Shelva Majestic  Department: [facility]  Visit Type: OFFICE VISIT  Date: 02/02/2023  Medication: mirabegron ER (MYRBETRIQ) 25 MG TB24 tablet [[id number]] - currently out of medication rosuvastatin (CRESTOR) 5 MG tablet [[id number]] desvenlafaxine (PRISTIQ) 50 MG 24 hr tablet [[id number]] omeprazole (PRILOSEC) 20 MG capsule [[id number]] fenofibrate 160 MG tablet [[id number]] levocetirizine (XYZAL) 5 MG tablet [[id number]] montelukast (SINGULAIR) 10 MG tablet [[id number]] traZODone (DESYREL) 100 MG tablet [[id number]]  Has the patient contacted their pharmacy? Yes (Agent: If no, request that the patient contact the pharmacy for the refill. If patient does not wish to contact the pharmacy document the reason why and proceed with request.) (Agent: If yes, when and what did the pharmacy advise?)  Is this the correct pharmacy for this prescription? Yes If no, delete pharmacy and type the correct one.  This is the patient's preferred pharmacy:   [facility] - [place], [place] - [address] [place] [address] Phone: [phone] Fax: [phone]   Has the prescription been filled recently? Yes  Is the patient out of the medication? Yes  Has the patient been seen for an appointment in the last year OR does the patient have an upcoming appointment? Yes  Can we respond through MyChart? Yes  Agent: Please be advised that Rx refills may take up to 3 business days. We ask that you follow-up with your pharmacy.

## 2023-02-08 ENCOUNTER — Telehealth: Payer: Self-pay | Admitting: Family Medicine

## 2023-02-08 NOTE — Telephone Encounter (Unsigned)
Copied from CRM [id number]. Topic: Clinical - Prescription Issue >> Feb 08, 2023  3:30 PM Efraim Kaufmann C wrote: Reason for CRM: patient ordered several refills from [facility] pharmacy yesterday 1/21 which was different than normal, however she cancelled the refill for desvenlafaxine (PRISTIQ) 50 MG 24 hr tablet and would now like that ordered through  [facility]) - [place], [place] - [address] [facility] [address] [place] [address], [place] [address] Phone: [phone]  Fax: [phone]   If you have any questions, please advise with patient. Thank you.

## 2023-02-09 DIAGNOSIS — D2261 Melanocytic nevi of right upper limb, including shoulder: Secondary | ICD-10-CM | POA: Diagnosis not present

## 2023-02-09 DIAGNOSIS — D2271 Melanocytic nevi of right lower limb, including hip: Secondary | ICD-10-CM | POA: Diagnosis not present

## 2023-02-09 DIAGNOSIS — L649 Androgenic alopecia, unspecified: Secondary | ICD-10-CM | POA: Diagnosis not present

## 2023-02-09 DIAGNOSIS — L570 Actinic keratosis: Secondary | ICD-10-CM | POA: Diagnosis not present

## 2023-02-09 DIAGNOSIS — L821 Other seborrheic keratosis: Secondary | ICD-10-CM | POA: Diagnosis not present

## 2023-02-09 DIAGNOSIS — D485 Neoplasm of uncertain behavior of skin: Secondary | ICD-10-CM | POA: Diagnosis not present

## 2023-02-09 DIAGNOSIS — D225 Melanocytic nevi of trunk: Secondary | ICD-10-CM | POA: Diagnosis not present

## 2023-02-09 DIAGNOSIS — Z85828 Personal history of other malignant neoplasm of skin: Secondary | ICD-10-CM | POA: Diagnosis not present

## 2023-02-09 MED ORDER — MIRABEGRON ER 25 MG PO TB24: 25.0000 mg | ORAL_TABLET | Freq: Every day | ORAL | 3 refills | Status: DC

## 2023-02-09 MED ORDER — MONTELUKAST SODIUM 10 MG PO TABS: 10.0000 mg | ORAL_TABLET | Freq: Every day | ORAL | 3 refills | Status: DC

## 2023-02-09 MED ORDER — TRAZODONE HCL 100 MG PO TABS: 100.0000 mg | ORAL_TABLET | Freq: Every day | ORAL | 3 refills | Status: DC

## 2023-02-09 MED ORDER — LEVOCETIRIZINE DIHYDROCHLORIDE 5 MG PO TABS: 5.0000 mg | ORAL_TABLET | Freq: Every evening | ORAL | 3 refills | Status: DC

## 2023-02-09 MED ORDER — OMEPRAZOLE 20 MG PO CPDR: 20.0000 mg | DELAYED_RELEASE_CAPSULE | Freq: Every day | ORAL | 3 refills | Status: DC

## 2023-02-09 MED ORDER — FENOFIBRATE 160 MG PO TABS: 160.0000 mg | ORAL_TABLET | Freq: Every day | ORAL | 3 refills | Status: DC

## 2023-02-09 MED ORDER — ROSUVASTATIN CALCIUM 5 MG PO TABS: 5.0000 mg | ORAL_TABLET | Freq: Every day | ORAL | 3 refills | Status: DC

## 2023-02-09 MED ORDER — DESVENLAFAXINE SUCCINATE ER 50 MG PO TB24: 50.0000 mg | ORAL_TABLET | Freq: Every day | ORAL | 3 refills | Status: DC

## 2023-02-09 NOTE — Telephone Encounter (Signed)
Medications refilled

## 2023-02-09 NOTE — Telephone Encounter (Signed)
Pristiq sent to [facility].

## 2023-02-09 NOTE — Telephone Encounter (Signed)
Patient would also like medications below be sent to  [facility] [place], [place] - [address] Phone: [phone]  Fax: [phone]     fenofibrate 160 MG tablet  levocetirizine (XYZAL) 5 MG tablet  mirabegron ER (MYRBETRIQ) 25 MG TB24 tablet  montelukast (SINGULAIR) 10 MG tablet  omeprazole (PRILOSEC) 20 MG capsule  rosuvastatin (CRESTOR) 5 MG tablet  traZODone (DESYREL) 100 MG tablet

## 2023-02-09 NOTE — Addendum Note (Signed)
Addended by: Gwenette Greet on: 02/09/2023 03:24 PM   Modules accepted: Orders

## 2023-02-09 NOTE — Addendum Note (Signed)
Addended by: Gwenette Greet on: 02/09/2023 08:24 AM   Modules accepted: Orders

## 2023-02-13 ENCOUNTER — Telehealth: Payer: Self-pay | Admitting: Family Medicine

## 2023-02-13 NOTE — Telephone Encounter (Signed)
Copied from CRM [id number]. Topic: Clinical - Prescription Issue >> Feb 13, 2023  4:14 PM Irine Seal wrote: Reason for CRM: pt was previously with [facility] but [facility], the Rx for mirabegron ER (MYRBETRIQ) 25 MG TB24 tablet  - [facility] cancelled this order because they dont carry the generic form of this. Patient is wanting to see if the brand name can be called in instead,  I offered to send it to another pharmacy she stated she would need to call around and see.  Callback [phone]

## 2023-02-14 MED ORDER — MYRBETRIQ 25 MG PO TB24: 25.0000 mg | ORAL_TABLET | Freq: Every day | ORAL | 3 refills | Status: DC

## 2023-02-14 NOTE — Addendum Note (Signed)
Addended by: Gwenette Greet on: 02/14/2023 08:38 AM   Modules accepted: Orders

## 2023-02-14 NOTE — Telephone Encounter (Signed)
Brand has been sent to [facility].

## 2023-05-02 ENCOUNTER — Telehealth: Payer: Self-pay

## 2023-05-02 ENCOUNTER — Other Ambulatory Visit: Payer: Self-pay

## 2023-05-02 MED ORDER — DESVENLAFAXINE SUCCINATE ER 50 MG PO TB24: 50.0000 mg | ORAL_TABLET | Freq: Every day | ORAL | 3 refills | Status: DC

## 2023-05-02 NOTE — Telephone Encounter (Signed)
 Copied from CRM [id number]. Topic: Clinical - Medication Question >> May 02, 2023  1:06 PM Kristin Bonilla wrote: Reason for LKG:MWNUUVO would like to get her prescription desvenlafaxine (PRISTIQ) 50 MG 24 hr [facility] because she Can pay half of what insurance is pay. [facility]54 109 with insurance. Would like to know how to go about that, please call [phone]  [facility]   [address] [place], [place] [address]  Phone : [phone] Fax: N/A

## 2023-05-05 ENCOUNTER — Ambulatory Visit (INDEPENDENT_AMBULATORY_CARE_PROVIDER_SITE_OTHER): Payer: Self-pay | Admitting: Audiology

## 2023-05-05 DIAGNOSIS — H903 Sensorineural hearing loss, bilateral: Secondary | ICD-10-CM

## 2023-05-05 NOTE — Progress Notes (Signed)
  [address] [place], [place] [address] [phone]  Hearing Aid Check     DELESHA POHLMAN called and came as a walk in and dropped off her hearing aids at the front desk.      Right Left  Hearing aid manufacturer O[employer]on CROS PX miniRITE SN: [id number] O[employer]on Real 3 miniRITE-R SN: [id number]  Hearing aid style Receiver in the ear Receiver in the ear  Hearing aid battery rechargeable rechargeable  Receiver    Dome/ custom earpiece    Retention wire    Warranty expiration date 11-17-2024 11-17-2024  Loss and Damage    Initial fitting date 10-22-2021 10-22-2021  [facility] was [facility] at: [facility]    Chief complaint: Patient reports the left aid made a loud sound and then stopped working. Saw in clinic that it was not charging. .    Patient kept the charger. Both aids will be sent to [facility] for in warranty repair. No charges today or at pick-up.    Recommend: Return for a hearing aid check, when the aids are back in office. Return for a hearing evaluation and to see an ENT, if concerns with hearing changes arise.    Enriqueta Augusta MARIE LEROUX-MARTINEZ, AUD

## 2023-05-17 ENCOUNTER — Encounter (INDEPENDENT_AMBULATORY_CARE_PROVIDER_SITE_OTHER): Payer: Self-pay

## 2023-05-17 ENCOUNTER — Telehealth (INDEPENDENT_AMBULATORY_CARE_PROVIDER_SITE_OTHER): Payer: Self-pay | Admitting: Audiology

## 2023-05-17 NOTE — Telephone Encounter (Signed)
 Called patient per Dr. Nevelyn Barber request to schedule a Hearing Aid Check.  Appt can be scheduled at 4pm if nothing else is available per Dr. Colman Deans. LVM requesting a call back.

## 2023-05-22 ENCOUNTER — Ambulatory Visit (INDEPENDENT_AMBULATORY_CARE_PROVIDER_SITE_OTHER): Payer: Self-pay | Admitting: Audiology

## 2023-05-22 DIAGNOSIS — H903 Sensorineural hearing loss, bilateral: Secondary | ICD-10-CM

## 2023-05-22 NOTE — Progress Notes (Unsigned)
  [address] [place], [place] [address] [phone]  Hearing Aid Check     Kristin Bonilla comes for a scheduled appointment for a hearing aid check.    Right Left  Hearing aid manufacturer O[employer]on CROS PX miniRITE SN: [id number] Oticon Real 3 miniRITE-R SN: [id number]  Hearing aid style Receiver in the ear Receiver in the ear  Hearing aid battery rechargeable rechargeable  Receiver      Dome/ custom earpiece      Retention wire      Warranty expiration date 11-17-2024 11-17-2024  Loss and Damage      Initial fitting date 10-22-2021 10-22-2021  Device was fit at: [facility]     Reason for visit: Patient comes to pick hearing aids after they were sent to [facility] for repair.  Actions taken: Inspection of the device and listening check showed that both aids were working well. Before the patient came in hearing aids were programmed according to previous test results on file. Could not load previous settings as the file was not available in the fitting software. Patient says she has never noticed a difference when using the CROS side. We talked about situations when I would expect it to be of help. Live speech mapping may be ideal, but we do not have the equipment available at this clinic.  Services fee: $0 was paid at checkout.   Recommend: Return for a hearing aid check , as needed. Return for a hearing evaluation and to see an ENT, if concerns with hearing changes arise.    Kristin Bonilla Kristin Bonilla, AUD

## 2023-06-15 DIAGNOSIS — Z1231 Encounter for screening mammogram for malignant neoplasm of breast: Secondary | ICD-10-CM | POA: Diagnosis not present

## 2023-06-20 ENCOUNTER — Ambulatory Visit: Payer: Medicare HMO

## 2023-06-23 ENCOUNTER — Telehealth (INDEPENDENT_AMBULATORY_CARE_PROVIDER_SITE_OTHER): Payer: Self-pay

## 2023-06-23 ENCOUNTER — Telehealth (INDEPENDENT_AMBULATORY_CARE_PROVIDER_SITE_OTHER): Payer: Self-pay | Admitting: Audiology

## 2023-06-23 NOTE — Telephone Encounter (Signed)
 Called patient, left a voice message to let her know I tried to contact her. She can call back and leave a more detailed message about her questions or call and ask for a hearing aid check appointment with me.

## 2023-06-23 NOTE — Telephone Encounter (Signed)
 Patient left a message requesting a call back, she has some questions about her hearing aids.

## 2023-06-27 ENCOUNTER — Telehealth (INDEPENDENT_AMBULATORY_CARE_PROVIDER_SITE_OTHER): Payer: Self-pay

## 2023-06-27 NOTE — Telephone Encounter (Signed)
 Patient called and stated her hearing aids came with a device where the TV would come through her hearing aids and now when that happens she is hearing a noise.  She is asking is this device covered under her warranty.  She is requesting a call back  [phone]

## 2023-06-29 ENCOUNTER — Telehealth: Payer: Self-pay | Admitting: Audiology

## 2023-06-29 NOTE — Telephone Encounter (Signed)
 Called patient and gave her the [facility] Customer Service # [phone]).  She is going to call and try to resolve the problem.  Also, scheduled her for hearing aid check to bring in TV adapter with hearing aids.  If she can resolve the problem through [facility], she will call and cancel tomorrow's appt.  She is not having trouble connecting to the TV.  When it is connected to the TV, she hears a sound that sounds like a card hitting the wheel of a bicycle.

## 2023-06-30 ENCOUNTER — Ambulatory Visit (INDEPENDENT_AMBULATORY_CARE_PROVIDER_SITE_OTHER): Admitting: Audiology

## 2023-07-19 ENCOUNTER — Ambulatory Visit (INDEPENDENT_AMBULATORY_CARE_PROVIDER_SITE_OTHER): Payer: Self-pay | Admitting: Audiology

## 2023-07-19 DIAGNOSIS — H903 Sensorineural hearing loss, bilateral: Secondary | ICD-10-CM

## 2023-07-19 NOTE — Progress Notes (Addendum)
  [address] [place], [place] [address] [phone]  Hearing Aid Check     YANELLY CANTRELLE comes for a scheduled appointment for a hearing aid check.  Accompanied ab:[id number]     Right Left  Hearing aid manufacturer [employer] CROS PX miniRITE SN: [id number] [employer] Real 3 miniRITE-R SN: [id number]  Hearing aid style Receiver in the ear Receiver in the ear  Hearing aid battery rechargeable rechargeable  Receiver      Dome/ custom earpiece      Retention wire      Warranty expiration date 11-17-2024 11-17-2024  Loss and Damage      Initial fitting date 10-22-2021 10-22-2021  [facility]     Chief complaint: Patient reports the TV streamer is intermittently streaming sounds to her aid and speech from TV sound .  Actions taken:  Deleted connection from aids to TV connector in the software.  Paired the aids to the accessory again. Patient will try at home and if she continues to hear a fluttering sound/intermittent function, then she will let me know.  I will  then call [employer] to ask them to send a TOS link cable (recommended by [facility] -Audiology).  Services fee: $0 was paid at checkout.  Recommend: Return for a hearing aid check , as needed. Return for a hearing evaluation and to see an ENT, if concerns with hearing changes arise. *Of note, patient called later in the day to say that the changes made did not improve the connectivity. Called [employer] and they placed  an order for a TOS link cable as a courtesy. Left the patient a voice message to let her know that it was ordered and we will call back when it is ready to be picked up.    Wayburn Shaler MARIE LEROUX-MARTINEZ, AUD

## 2023-08-03 DIAGNOSIS — H524 Presbyopia: Secondary | ICD-10-CM | POA: Diagnosis not present

## 2023-08-03 DIAGNOSIS — H04123 Dry eye syndrome of bilateral lacrimal glands: Secondary | ICD-10-CM | POA: Diagnosis not present

## 2023-08-03 DIAGNOSIS — H25813 Combined forms of age-related cataract, bilateral: Secondary | ICD-10-CM | POA: Diagnosis not present

## 2023-08-04 ENCOUNTER — Ambulatory Visit (INDEPENDENT_AMBULATORY_CARE_PROVIDER_SITE_OTHER): Payer: Medicare HMO | Admitting: Otolaryngology

## 2023-08-04 ENCOUNTER — Encounter (INDEPENDENT_AMBULATORY_CARE_PROVIDER_SITE_OTHER): Payer: Self-pay | Admitting: Otolaryngology

## 2023-08-04 ENCOUNTER — Encounter: Payer: Medicare Other | Admitting: Family Medicine

## 2023-08-04 VITALS — BP 108/72 | HR 88

## 2023-08-04 DIAGNOSIS — H8101 Meniere's disease, right ear: Secondary | ICD-10-CM

## 2023-08-04 DIAGNOSIS — R42 Dizziness and giddiness: Secondary | ICD-10-CM | POA: Insufficient documentation

## 2023-08-04 DIAGNOSIS — H9041 Sensorineural hearing loss, unilateral, right ear, with unrestricted hearing on the contralateral side: Secondary | ICD-10-CM

## 2023-08-04 NOTE — Progress Notes (Signed)
 Patient ID: Kristin Bonilla, female   DOB: 1950/05/17, 73 y.o.   MRN: [id number]  Follow-up: Right ear hearing loss, right ear Mnire's disease  HPI: 7/24 The patient is a 73 year old female who returns today for her follow-up evaluation.  The patient was previously seen for her right ear hearing loss and right ear Mnire's disease. According to the patient, she has been symptomatic for 20+ years.  She was previously evaluated by Dr. Lonni Angle and ENT physicians at the [facility].  Her previous MRI scan was negative for intracranial lesion.  At her last visit, she was noted to have asymmetric right ear severe to profound sensorineural hearing loss.  Her right ear Mnire's disease was stable.  She was treated with low-salt diet and CROS hearing amplification.  The patient returns today complaining of 1 episode of dizziness in May 2025.  It lasted for 3 days.  She denies any change in her hearing.  She also denies any otalgia or otorrhea.  Exam: General: Communicates without difficulty, well nourished, no acute distress. Head: Normocephalic, no evidence injury, no tenderness, facial buttresses intact without stepoff. Face/sinus: No tenderness to palpation and percussion. Facial movement is normal and symmetric. Eyes: PERRL, EOMI. No scleral icterus, conjunctivae clear. Neuro: CN II exam reveals vision grossly intact.  No nystagmus at any point of gaze. Ears: Auricles well formed without lesions.  Ear canals are intact without mass or lesion.  No erythema or edema is appreciated.  The TMs are intact without fluid. Nose: External evaluation reveals normal support and skin without lesions.  Dorsum is intact.  Anterior rhinoscopy reveals pink mucosa over anterior aspect of inferior turbinates and intact septum.  No purulence noted. Oral:  Oral cavity and oropharynx are intact, symmetric, without erythema or edema.  Mucosa is moist without lesions. Neck: Full range of motion without pain.  There is  no significant lymphadenopathy.  No masses palpable.  Thyroid  bed within normal limits to palpation.  Parotid glands and submandibular glands equal bilaterally without mass.  Trachea is midline. Neuro:  CN 2-12 grossly intact. Gait wide-based. Vestibular: No nystagmus at any point of gaze. Dix Hallpike negative. Vestibular: There is no nystagmus with pneumatic pressure on either tympanic membrane or Valsalva. The cerebellar examination is unremarkable.   Assessment: 1.  Recurrent dizziness, secondary to right ear Mnire's disease. Currently she has no vestibular symptoms.  Her last dizzy spell was in May 2025. 2.  Subjectively stable asymmetric right ear severe to profound sensorineural hearing loss. 3.  The patient's ear canals, tympanic membranes, and middle ear spaces are normal.   Plan: 1.  The physical exam findings are reviewed with the patient. 2.  The pathophysiology of dizziness and Mnire's disease are extensively discussed with the patient.  The treatment options for Mnire's disease are discussed. 3.  Continue the use of her hearing aids. 4.  Continue with her 1500 mg low-salt diet. 5.  The patient will return for reevaluation in 1 year.

## 2023-08-11 ENCOUNTER — Telehealth (INDEPENDENT_AMBULATORY_CARE_PROVIDER_SITE_OTHER): Payer: Self-pay | Admitting: Audiology

## 2023-08-11 NOTE — Telephone Encounter (Signed)
 Patient requests call back regarding HA and connecting to HA App.

## 2023-08-17 DIAGNOSIS — M9904 Segmental and somatic dysfunction of sacral region: Secondary | ICD-10-CM | POA: Diagnosis not present

## 2023-08-17 DIAGNOSIS — M9903 Segmental and somatic dysfunction of lumbar region: Secondary | ICD-10-CM | POA: Diagnosis not present

## 2023-08-17 DIAGNOSIS — M51361 Other intervertebral disc degeneration, lumbar region with lower extremity pain only: Secondary | ICD-10-CM | POA: Diagnosis not present

## 2023-08-17 DIAGNOSIS — M9905 Segmental and somatic dysfunction of pelvic region: Secondary | ICD-10-CM | POA: Diagnosis not present

## 2023-08-21 ENCOUNTER — Encounter (INDEPENDENT_AMBULATORY_CARE_PROVIDER_SITE_OTHER): Payer: Self-pay

## 2023-08-21 ENCOUNTER — Ambulatory Visit (INDEPENDENT_AMBULATORY_CARE_PROVIDER_SITE_OTHER): Payer: Self-pay | Admitting: Audiology

## 2023-08-21 DIAGNOSIS — M9903 Segmental and somatic dysfunction of lumbar region: Secondary | ICD-10-CM | POA: Diagnosis not present

## 2023-08-21 DIAGNOSIS — H903 Sensorineural hearing loss, bilateral: Secondary | ICD-10-CM

## 2023-08-21 DIAGNOSIS — M51361 Other intervertebral disc degeneration, lumbar region with lower extremity pain only: Secondary | ICD-10-CM | POA: Diagnosis not present

## 2023-08-21 DIAGNOSIS — M9904 Segmental and somatic dysfunction of sacral region: Secondary | ICD-10-CM | POA: Diagnosis not present

## 2023-08-21 DIAGNOSIS — M9905 Segmental and somatic dysfunction of pelvic region: Secondary | ICD-10-CM | POA: Diagnosis not present

## 2023-08-21 NOTE — Progress Notes (Signed)
  [address] [place], [place] [address] [phone]  Hearing Aid Check     Kristin Bonilla came to pick up a cable for her TV accessory.       Right Left  Hearing aid manufacturer [employer] CROS PX miniRITE SN: [id number] [employer] Real 3 miniRITE-R SN: [id number]  Hearing aid style Receiver in the ear Receiver in the ear  Hearing aid battery rechargeable rechargeable  Receiver      Dome/ custom earpiece      Retention wire      Warranty expiration date 11-17-2024 11-17-2024  Loss and Damage      Initial fitting date 10-22-2021 10-22-2021  [facility]     The cable sent by [employer] was given to the patient by the front desk.   Services fee: $0 was paid at checkout.    Recommend: Return for a hearing aid check , as needed. Return for a hearing evaluation and to see an ENT, if concerns with hearing changes arise.    Lauria Depoy MARIE LEROUX-MARTINEZ, AUD

## 2023-08-23 DIAGNOSIS — M9905 Segmental and somatic dysfunction of pelvic region: Secondary | ICD-10-CM | POA: Diagnosis not present

## 2023-08-23 DIAGNOSIS — M51361 Other intervertebral disc degeneration, lumbar region with lower extremity pain only: Secondary | ICD-10-CM | POA: Diagnosis not present

## 2023-08-23 DIAGNOSIS — M9903 Segmental and somatic dysfunction of lumbar region: Secondary | ICD-10-CM | POA: Diagnosis not present

## 2023-08-23 DIAGNOSIS — M9904 Segmental and somatic dysfunction of sacral region: Secondary | ICD-10-CM | POA: Diagnosis not present

## 2023-08-28 DIAGNOSIS — M9905 Segmental and somatic dysfunction of pelvic region: Secondary | ICD-10-CM | POA: Diagnosis not present

## 2023-08-28 DIAGNOSIS — M51361 Other intervertebral disc degeneration, lumbar region with lower extremity pain only: Secondary | ICD-10-CM | POA: Diagnosis not present

## 2023-08-28 DIAGNOSIS — M9904 Segmental and somatic dysfunction of sacral region: Secondary | ICD-10-CM | POA: Diagnosis not present

## 2023-08-28 DIAGNOSIS — M9903 Segmental and somatic dysfunction of lumbar region: Secondary | ICD-10-CM | POA: Diagnosis not present

## 2023-08-29 DIAGNOSIS — M9903 Segmental and somatic dysfunction of lumbar region: Secondary | ICD-10-CM | POA: Diagnosis not present

## 2023-08-29 DIAGNOSIS — M9904 Segmental and somatic dysfunction of sacral region: Secondary | ICD-10-CM | POA: Diagnosis not present

## 2023-08-29 DIAGNOSIS — M51361 Other intervertebral disc degeneration, lumbar region with lower extremity pain only: Secondary | ICD-10-CM | POA: Diagnosis not present

## 2023-08-29 DIAGNOSIS — M9905 Segmental and somatic dysfunction of pelvic region: Secondary | ICD-10-CM | POA: Diagnosis not present

## 2023-08-31 DIAGNOSIS — M9904 Segmental and somatic dysfunction of sacral region: Secondary | ICD-10-CM | POA: Diagnosis not present

## 2023-08-31 DIAGNOSIS — M9905 Segmental and somatic dysfunction of pelvic region: Secondary | ICD-10-CM | POA: Diagnosis not present

## 2023-08-31 DIAGNOSIS — M51361 Other intervertebral disc degeneration, lumbar region with lower extremity pain only: Secondary | ICD-10-CM | POA: Diagnosis not present

## 2023-08-31 DIAGNOSIS — M9903 Segmental and somatic dysfunction of lumbar region: Secondary | ICD-10-CM | POA: Diagnosis not present

## 2023-09-05 DIAGNOSIS — M9904 Segmental and somatic dysfunction of sacral region: Secondary | ICD-10-CM | POA: Diagnosis not present

## 2023-09-05 DIAGNOSIS — M9903 Segmental and somatic dysfunction of lumbar region: Secondary | ICD-10-CM | POA: Diagnosis not present

## 2023-09-05 DIAGNOSIS — M51361 Other intervertebral disc degeneration, lumbar region with lower extremity pain only: Secondary | ICD-10-CM | POA: Diagnosis not present

## 2023-09-05 DIAGNOSIS — M9905 Segmental and somatic dysfunction of pelvic region: Secondary | ICD-10-CM | POA: Diagnosis not present

## 2023-09-06 ENCOUNTER — Ambulatory Visit (INDEPENDENT_AMBULATORY_CARE_PROVIDER_SITE_OTHER): Admitting: Family Medicine

## 2023-09-06 ENCOUNTER — Ambulatory Visit: Payer: Self-pay | Admitting: Family Medicine

## 2023-09-06 ENCOUNTER — Encounter: Payer: Self-pay | Admitting: Family Medicine

## 2023-09-06 VITALS — BP 100/68 | HR 68 | Temp 97.7°F | Ht 62.0 in | Wt 158.0 lb

## 2023-09-06 DIAGNOSIS — M1811 Unilateral primary osteoarthritis of first carpometacarpal joint, right hand: Secondary | ICD-10-CM | POA: Diagnosis not present

## 2023-09-06 DIAGNOSIS — R7303 Prediabetes: Secondary | ICD-10-CM

## 2023-09-06 DIAGNOSIS — Z0000 Encounter for general adult medical examination without abnormal findings: Secondary | ICD-10-CM | POA: Diagnosis not present

## 2023-09-06 DIAGNOSIS — Z131 Encounter for screening for diabetes mellitus: Secondary | ICD-10-CM

## 2023-09-06 DIAGNOSIS — Z87448 Personal history of other diseases of urinary system: Secondary | ICD-10-CM | POA: Diagnosis not present

## 2023-09-06 DIAGNOSIS — E785 Hyperlipidemia, unspecified: Secondary | ICD-10-CM | POA: Diagnosis not present

## 2023-09-06 LAB — URINALYSIS, ROUTINE W REFLEX MICROSCOPIC
Bilirubin Urine: NEGATIVE
Hgb urine dipstick: NEGATIVE
Ketones, ur: NEGATIVE
Leukocytes,Ua: NEGATIVE
Nitrite: NEGATIVE
Specific Gravity, Urine: 1.015 (ref 1.000–1.030)
Total Protein, Urine: NEGATIVE
Urine Glucose: NEGATIVE
Urobilinogen, UA: 0.2 (ref 0.0–1.0)
pH: 6.5 (ref 5.0–8.0)

## 2023-09-06 LAB — CBC WITH DIFFERENTIAL/PLATELET
Basophils Absolute: 0.1 K/uL (ref 0.0–0.1)
Basophils Relative: 2.5 % (ref 0.0–3.0)
Eosinophils Absolute: 0.2 K/uL (ref 0.0–0.7)
Eosinophils Relative: 5.1 % — ABNORMAL HIGH (ref 0.0–5.0)
HCT: 41.8 % (ref 36.0–46.0)
Hemoglobin: 13.8 g/dL (ref 12.0–15.0)
Lymphocytes Relative: 46.8 % — ABNORMAL HIGH (ref 12.0–46.0)
Lymphs Abs: 1.6 K/uL (ref 0.7–4.0)
MCHC: 33.1 g/dL (ref 30.0–36.0)
MCV: 90.2 fl (ref 78.0–100.0)
Monocytes Absolute: 0.3 K/uL (ref 0.1–1.0)
Monocytes Relative: 9.6 % (ref 3.0–12.0)
Neutro Abs: 1.2 K/uL — ABNORMAL LOW (ref 1.4–7.7)
Neutrophils Relative %: 36 % — ABNORMAL LOW (ref 43.0–77.0)
Platelets: 265 K/uL (ref 150.0–400.0)
RBC: 4.63 Mil/uL (ref 3.87–5.11)
RDW: 13.8 % (ref 11.5–15.5)
WBC: 3.4 K/uL — ABNORMAL LOW (ref 4.0–10.5)

## 2023-09-06 LAB — COMPREHENSIVE METABOLIC PANEL WITH GFR
ALT: 17 U/L (ref 0–35)
AST: 26 U/L (ref 0–37)
Albumin: 4.6 g/dL (ref 3.5–5.2)
Alkaline Phosphatase: 54 U/L (ref 39–117)
BUN: 25 mg/dL — ABNORMAL HIGH (ref 6–23)
CO2: 30 meq/L (ref 19–32)
Calcium: 10 mg/dL (ref 8.4–10.5)
Chloride: 104 meq/L (ref 96–112)
Creatinine, Ser: 0.98 mg/dL (ref 0.40–1.20)
GFR: 56.99 mL/min — ABNORMAL LOW (ref >60.00)
Glucose, Bld: 96 mg/dL (ref 70–99)
Potassium: 4.2 meq/L (ref 3.5–5.1)
Sodium: 142 meq/L (ref 135–145)
Total Bilirubin: 0.4 mg/dL (ref 0.2–1.2)
Total Protein: 7.2 g/dL (ref 6.0–8.3)

## 2023-09-06 LAB — TSH: TSH: 3.01 u[IU]/mL (ref 0.35–5.50)

## 2023-09-06 LAB — HEMOGLOBIN A1C: Hgb A1c MFr Bld: 5.9 % (ref 4.6–6.5)

## 2023-09-06 NOTE — Progress Notes (Signed)
 Phone [phone]   Subjective:  Patient presents today for their annual physical. Chief complaint-noted.   See problem oriented charting- ROS- full  review of systems was completed and negative Per full ROS sheet completed by patient except for topics noted under acute/chronic concerns other than some fatigue  The following were reviewed and entered/updated in epic: Past Medical History:  Diagnosis Date   Allergy 1983   Seasonal   Arthritis    Cataracts, bilateral 06/14/2018   Chronic kidney disease    Stage 3   GERD (gastroesophageal reflux disease)    Hyperlipidemia    Internal thrombosed hemorrhoids 01/09/2008   Meniere disease    Menopause    Nuclear sclerosis 06/14/2018   Patient Active Problem List   Diagnosis Date Noted   Overactive bladder 07/29/2021    Priority: Medium    Aortic atherosclerosis (HCC) 08/22/2020    Priority: Medium    Leukopenia 05/29/2014    Priority: Medium    CKD (chronic kidney disease), stage III (HCC) 12/20/2013    Priority: Medium    Meniere disease 12/20/2013    Priority: Medium    Major depression in full remission (HCC) 06/07/2007    Priority: Medium    Hyperlipidemia 10/25/2006    Priority: Medium    Cataracts, bilateral 06/14/2018    Priority: Low   Nuclear sclerosis 06/14/2018    Priority: Low   Allergic rhinitis 07/18/2017    Priority: Low   Osteopenia 12/03/2014    Priority: Low   Insomnia 05/29/2014    Priority: Low   Alopecia 04/27/2009    Priority: Low   GERD (gastroesophageal reflux disease) 01/09/2008    Priority: Low   Irritable bowel syndrome 01/09/2008    Priority: Low   Dizziness 08/04/2023   Sensorineural hearing loss, unilateral, right ear, with unrestricted hearing on the contralateral side 08/04/2023   Hematuria, microscopic 10/08/2018   History reviewed. No pertinent surgical history.  Family History  Problem Relation Age of Onset   Cancer Father        melanoma   Heart disease Father         Died at 14   Diverticulosis Father    Early death Father    COPD Mother    Osteoporosis Mother    Hiatal hernia Mother    Depression Mother    Early death Mother    Stroke Paternal Grandmother    Heart disease Other        died 2, around age 76-CABG, smoker   GER disease Son    Mental retardation Son    Heart disease Son    Birth defects Son     Medications- reviewed and updated Current Outpatient Medications  Medication Sig Dispense Refill   acetaminophen  (TYLENOL ) 500 MG tablet Take 1,000 mg by mouth daily as needed for mild pain or headache.     albuterol  (VENTOLIN  HFA) 108 (90 Base) MCG/ACT inhaler Inhale 2 puffs into the lungs every 6 (six) hours as needed for wheezing or shortness of breath. Inhale 1 puff, hold for a few seconds, wait a moment, then inhale 2nd puff. 8 g 2   Albuterol -Budesonide (AIRSUPRA ) 90-80 MCG/ACT AERO Inhale 1 puff into the lungs every 6 (six) hours as needed (as needed for shortness of breath). 10.7 g 1   aspirin EC 81 MG tablet Take 81 mg by mouth daily.     Biotin 5000 MCG CAPS Take 5,000 mcg by mouth daily.     Calcium  Carbonate-Vit D-Min (CALCIUM  1200  PO) Take 1 tablet by mouth daily.     desvenlafaxine  (PRISTIQ ) 50 MG 24 hr tablet Take 1 tablet (50 mg total) by mouth daily. 90 tablet 3   fenofibrate  160 MG tablet Take 1 tablet (160 mg total) by mouth daily. 90 tablet 3   ipratropium (ATROVENT ) 0.03 % nasal spray Place 2 sprays into both nostrils 2 (two) times daily as needed for rhinitis. 30 mL 12   levocetirizine (XYZAL ) 5 MG tablet Take 1 tablet (5 mg total) by mouth every evening. 90 tablet 3   Melatonin 5 MG TABS Take 1 tablet by mouth at bedtime.     montelukast  (SINGULAIR ) 10 MG tablet Take 1 tablet (10 mg total) by mouth at bedtime. 90 tablet 3   Multiple Vitamin (MULTIVITAMIN) tablet Take 1 tablet by mouth daily.     MYRBETRIQ  25 MG TB24 tablet Take 1 tablet (25 mg total) by mouth daily. 90 tablet 3   Omega-3 Fatty Acids (FISH OIL  BURP-LESS PO) Take 1 capsule by mouth daily.     omeprazole  (PRILOSEC) 20 MG capsule Take 1-2 capsules (20-40 mg total) by mouth daily. 180 capsule 3   polyethylene glycol powder (GLYCOLAX/MIRALAX) 17 GM/SCOOP powder Take 17 g by mouth daily as needed for mild constipation.     Probiotic Product (PROBIOTIC ADVANCED PO) Take by mouth daily.     rosuvastatin  (CRESTOR ) 5 MG tablet Take 1 tablet (5 mg total) by mouth daily. 90 tablet 3   traZODone  (DESYREL ) 100 MG tablet Take 1 tablet (100 mg total) by mouth at bedtime. 90 tablet 3   No current facility-administered medications for this visit.    Allergies-reviewed and updated Allergies  Allergen Reactions   Erythromycin Swelling   Sulfa Antibiotics    Sulfonamide Derivatives Nausea Only    Social History   Social History Narrative   Married. 2 children (son is special needs-in group home living). 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 Oropharynx normal Neck: no thyromegaly CV: RRR no murmurs rubs or gallops Lungs: CTAB no crackles, wheeze, rhonchi Abdomen: soft/nontender/nondistended/normal bowel sounds. No rebound or guarding.  Ext: no edema Skin: warm, dry Neuro: grossly normal, moves all extremities, PERRLA   Assessment and Plan   73 y.o. female presenting for annual physical.  Health Maintenance counseling: 1. Anticipatory guidance: Patient counseled regarding regular dental exams -q6 months, eye exams -yearly,  avoiding smoking and second hand smoke , limiting alcohol to 1 beverage per day- doesn't drink , no illicit drugs .   2. Risk factor  reduction:  Advised patient of need for regular exercise and diet rich and fruits and vegetables to reduce risk of heart attack and stroke.  Exercise- strongly encouraged getting going on her stationary bike or some other form of exercise such as [facility] center with family member.  Diet/weight management-stable from last visit- encouraged mild weight loss.  Wt Readings from Last 3 Encounters:  09/06/23 158 lb (71.7 kg)  02/02/23 157 lb 3.2 oz (71.3 kg)  01/09/23 158 lb 12.8  oz (72 kg)   3. Immunizations/screenings/ancillary studies- will get fall flu shot. Holding off on COVID. Otherwise up to date other than Tetanus, Diphtheria, and Pertussis (Tdap)  Immunization History  Administered Date(s) Administered   Fluad Quad(high Dose 65+) 09/13/2018, 10/28/2019   Fluad Trivalent(High Dose 65+) 11/01/2022   Influenza Split 11/26/2010, 12/12/2011   Influenza Whole 10/18/2006, 11/10/2008, 11/24/2009   Influenza, High Dose Seasonal PF 11/27/2015   Influenza,inj,Quad PF,6+ Mos 12/12/2012, 10/29/2013, 12/03/2014, 10/31/2020   Influenza-Unspecified 10/22/2016, 09/18/2017, 10/21/2021, 11/08/2022   PFIZER(Purple Top)SARS-COV-2 Vaccination 03/02/2019, 03/24/2019, 10/28/2019   Pneumococcal Conjugate-13 12/03/2014   Pneumococcal Polysaccharide-23 12/12/2011, 01/06/2017   Td 01/18/2000   Tdap 11/26/2010   Zoster Recombinant(Shingrix) 09/10/2017, 11/10/2017   Zoster, Live 04/09/2012   4. Cervical cancer screening- still sees gynecology andt hey decide on paps- but technically past age based screening recommendations  5. Breast cancer screening-  breast exam with GYN and mammogram - yearly- we are requesting most recnet one 6. Colon cancer screening - 03/17/22- will be due again in 2027 7. Skin cancer screening- Dr. Bonney dermatology. advised regular sunscreen use. Denies worrisome, changing, or new skin lesions.  8. Birth control/STD check- monogmous/ postmenopausal 9. Osteoporosis screening at 65- see  below  10. Smoking associated screening - never smoker  Status of chronic or acute concerns   #Right thumb pain at [facility]- can be very bothersome at times- would like to sit down with orthopedics- will refer to Dr. Camella with [facility]. She is right handed. Suspect CMC arthritis- most of her pain is located there- some wrist pain and some distal pain.   #Right low back pain- still workign with chiropractor  #hyperlipidemia-coronary artery calcium  score of 1 in 2022 #Aortic atherosclerosis S: Medication: Fenofibrate  160 mg, rosuvastatin  5 mg daily Lab Results  Component Value Date   CHOL 165 08/02/2022   HDL 70.10 08/02/2022   LDLCALC 77 08/02/2022   LDLDIRECT 66.0 01/29/2021   TRIG 90.0 08/02/2022   CHOLHDL 2 08/02/2022   A/P: close to ideal goals last year- continue current medications likely- update levels  #Chronic kidney disease stage III S: GFR is typically in the 50s range but has been higher at times -Patient knows to avoid NSAIDs  A/P: hopefully stable- update cmp today. Continue without meds for now    # GERD S:Medication: Prilosec 20 mg- rarely has to use 2nd A/P: doing well with this overall- continue current medications    # Depression #Insomnia S: Medication: Pristiq  50 mg for depression, trazodone  and melatonin combination for sleep in the past and have discussed possible Belsomra     09/06/2023    9:25 AM 12/06/2022    8:50 AM 08/02/2022   10:36 AM  Depression screen PHQ 2/9  Decreased Interest 0 0 1  Down, Depressed, Hopeless 0 0 1  PHQ - 2 Score 0 0 2  Altered sleeping 1 1 0  Tired, decreased energy 1 1 1   Change in appetite 0 0 0  Feeling bad or failure about yourself  0 0 0  Trouble concentrating 0 0 0  Moving slowly or fidgety/restless 0 0 0  Suicidal thoughts 0 0 0  PHQ-9 Score 2 2 3   Difficult doing work/chores Not difficult at all Not difficult at all Not difficult at all  A/P: depression full remission- continue current medications.  Sleep reportedly ok    #Mnire's disease S: 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  Has been on Maxide in the past but ENT  later took her off medication.  Dr. Karis follow up reguarly  A/P: ongoing issues- follow up with DrRONITA Karis- has hearing aid    # Low Bone density (formerly osteopenia) S: Last DEXA: 06/11/2020 with Dr. Celesta worst T score -1.2 and reported repeat in 2024- was told stable   Calcium : 1200mg  (through diet ok) recommended - taking Vitamin D: 1000 units a day recommended- taking -Weightbearing exercise encouraged-     A/P: low bone density reports stable- continue to follow up with gynecology   #Isolated leukopenia- very mild and intermittent monitor  #History hematuria-prior evaluation by Dr. Cam for gross hematuria-cystoscopy and CT in the past.  We will get a urine with labs annually   # Allergic rhinitis -Singulair  10 mg and also notes propensity to develop bronchitis-has Airsupra  available   #OAB 07/29/20- trial myrbetriq  25 mg- has been helpful  # Constipation-intermittent issues-intermittently uses. Doesn't always use miralax or stool softeners but works better if she does  # Hyperglycemia/insulin resistance/prediabetes S:  Medication: none Lab Results  Component Value Date   HGBA1C 5.9 02/02/2023   A/P: hopefully stable- update a1c today. Continue without meds for now   Recommended follow up: Return in about 6 months (around 03/08/2024) for followup or sooner if needed.Schedule b4 you leave.  Lab/Order associations: fasting   ICD-10-CM   1. Preventative health care  Z00.00     2. Osteoarthritis of carpometacarpal (CMC) joint of right thumb, unspecified osteoarthritis type  M18.11 Ambulatory referral to Hand Surgery    3. Hyperlipidemia, unspecified hyperlipidemia type  E78.5 Lipid panel    CBC w/Diff    Comp Met (CMET)    TSH    4. Screening for diabetes mellitus  Z13.1 Hemoglobin A1c    5. Prediabetes  R73.03 Hemoglobin A1c     6. History of hematuria  Z87.448 Urinalysis, Routine w reflex microscopic      No orders of the defined types were placed in this encounter.   Return precautions advised.  Garnette Lukes, MD

## 2023-09-06 NOTE — Patient Instructions (Addendum)
 Health Maintenance Due  Topic Date Due   Medicare Annual Wellness (AWV)  06/14/2023  You are eligible to schedule your annual wellness visit with our nurse specialist [place].  Please consider scheduling this before you leave today  Tetanus, Diphtheria, and Pertussis (Tdap) at pharmacy recommended  We have placed a referral for you today to Dr. Camella [facility]- please call their # if you do not hear within a week (may be listed below or you may see mychart message within a few days with #).    strongly encouraged getting going on her stationary bike or some other form of exercise such as smith center with family member or friend  Please stop by lab before you go If you have mychart- we will send your results within 3 business days of us  receiving them.  If you do not have mychart- we will call you about results within 5 business days of us  receiving them.  *please also note that you will see labs on mychart as soon as they post. I will later go in and write notes on them- will say notes from Dr. Katrinka   Recommended follow up: Return in about 6 months (around 03/08/2024) for followup or sooner if needed.Schedule b4 you leave.

## 2023-09-07 DIAGNOSIS — M9905 Segmental and somatic dysfunction of pelvic region: Secondary | ICD-10-CM | POA: Diagnosis not present

## 2023-09-07 DIAGNOSIS — M9903 Segmental and somatic dysfunction of lumbar region: Secondary | ICD-10-CM | POA: Diagnosis not present

## 2023-09-07 DIAGNOSIS — M9904 Segmental and somatic dysfunction of sacral region: Secondary | ICD-10-CM | POA: Diagnosis not present

## 2023-09-07 DIAGNOSIS — M51361 Other intervertebral disc degeneration, lumbar region with lower extremity pain only: Secondary | ICD-10-CM | POA: Diagnosis not present

## 2023-09-07 LAB — LIPID PANEL
Cholesterol: 170 mg/dL (ref <200)
HDL: 79 mg/dL (ref >=50)
LDL Cholesterol (Calc): 75 mg/dL
Non-HDL Cholesterol (Calc): 91 mg/dL (ref <130)
Total CHOL/HDL Ratio: 2.2 (calc) (ref <5.0)
Triglycerides: 79 mg/dL (ref <150)

## 2023-09-11 DIAGNOSIS — M51361 Other intervertebral disc degeneration, lumbar region with lower extremity pain only: Secondary | ICD-10-CM | POA: Diagnosis not present

## 2023-09-11 DIAGNOSIS — M9904 Segmental and somatic dysfunction of sacral region: Secondary | ICD-10-CM | POA: Diagnosis not present

## 2023-09-11 DIAGNOSIS — M9905 Segmental and somatic dysfunction of pelvic region: Secondary | ICD-10-CM | POA: Diagnosis not present

## 2023-09-11 DIAGNOSIS — M9903 Segmental and somatic dysfunction of lumbar region: Secondary | ICD-10-CM | POA: Diagnosis not present

## 2023-10-30 ENCOUNTER — Ambulatory Visit (INDEPENDENT_AMBULATORY_CARE_PROVIDER_SITE_OTHER): Payer: Self-pay | Admitting: Audiology

## 2023-10-30 DIAGNOSIS — H903 Sensorineural hearing loss, bilateral: Secondary | ICD-10-CM

## 2023-10-30 NOTE — Progress Notes (Unsigned)
  [address], [address] [place], [place] [address] [phone]  Hearing Aid Check     ZANYLA KLEBBA walked in to drop off the TV accessory because she keeps perceiving a fluttery sound.      Right Left  Hearing aid manufacturer [employer] CROS PX miniRITE SN: [id number] [employer] Real 3 miniRITE-R SN: [id number]  Hearing aid style Receiver in the ear Receiver in the ear  Hearing aid battery rechargeable rechargeable  Receiver      Dome/ custom earpiece      Retention wire      Warranty expiration date 11-17-2024 11-17-2024  Loss and Damage      Initial fitting date 10-22-2021 10-22-2021  Device was fit at: [facility]       Chief complaint: She reports continuing to perceive a fluttery sound, which she suspects may be related to the device.  Actions taken: Inspection of the device and listening check showed that ***.  Services fee: $0 was paid at checkout.  Patient was oriented about ***.  Recommend: Return for a hearing aid check ***. Return for a hearing evaluation and to see an ENT, if concerns with hearing changes arise. ***  Raheem Kolbe MARIE LEROUX-MARTINEZ, AUD

## 2023-10-31 ENCOUNTER — Telehealth (INDEPENDENT_AMBULATORY_CARE_PROVIDER_SITE_OTHER): Payer: Self-pay | Admitting: Audiology

## 2023-10-31 DIAGNOSIS — M9905 Segmental and somatic dysfunction of pelvic region: Secondary | ICD-10-CM | POA: Diagnosis not present

## 2023-10-31 DIAGNOSIS — M9903 Segmental and somatic dysfunction of lumbar region: Secondary | ICD-10-CM | POA: Diagnosis not present

## 2023-10-31 DIAGNOSIS — M5136 Other intervertebral disc degeneration, lumbar region with discogenic back pain only: Secondary | ICD-10-CM | POA: Diagnosis not present

## 2023-10-31 DIAGNOSIS — M9904 Segmental and somatic dysfunction of sacral region: Secondary | ICD-10-CM | POA: Diagnosis not present

## 2023-10-31 NOTE — Telephone Encounter (Signed)
 Called patient after she dropped off the TV connector yesterday with concerns of hearing a flutter.  No answer and left her a voice message.  I would like to know if when she had the TOS link cable changed for her TV , if it worked well until yesterday or if never took care of the issue.   I will touch base with [employer] representative again after I get clarification on this.

## 2023-11-06 ENCOUNTER — Telehealth (INDEPENDENT_AMBULATORY_CARE_PROVIDER_SITE_OTHER): Payer: Self-pay | Admitting: Audiology

## 2023-11-06 NOTE — Telephone Encounter (Signed)
 Called the patient at both her preferred and alternate phone numbers. There was no answer. I left another voicemail on her preferred number requesting a call back ( or message me via mychart), as I need additional information regarding the functionality of the TV adapter. I have not yet received a response.

## 2023-11-14 ENCOUNTER — Telehealth: Payer: Self-pay | Admitting: Family Medicine

## 2023-11-14 NOTE — Telephone Encounter (Signed)
 Patient aware they are up to date on prevnar vaccines.   Copied from CRM #[id number]. Topic: General - Other >> Nov 14, 2023 10:47 AM Kristin Bonilla wrote: Reason for CRM: patient called wanting to see if she ever had the prevnar shot  [phone]

## 2023-11-15 ENCOUNTER — Telehealth (INDEPENDENT_AMBULATORY_CARE_PROVIDER_SITE_OTHER): Payer: Self-pay | Admitting: Audiology

## 2023-11-15 ENCOUNTER — Encounter (INDEPENDENT_AMBULATORY_CARE_PROVIDER_SITE_OTHER): Payer: Self-pay

## 2023-11-15 NOTE — Telephone Encounter (Signed)
 Patient called me back and LVM stating the cable didn't make any difference, maybe not quite as bad, but it is still doing it..  She is going to wait to hear back from us .  She can be reached at [phone].

## 2023-11-15 NOTE — Telephone Encounter (Signed)
 Called patient per Dr. Tex request to find out if the cable we gave her for the TV Adapter improved the connection so she can inform the [facility] representative.  LVM and sent a MyChart msg requesting a call.

## 2023-11-16 ENCOUNTER — Encounter (INDEPENDENT_AMBULATORY_CARE_PROVIDER_SITE_OTHER): Payer: Self-pay

## 2023-11-16 ENCOUNTER — Telehealth (INDEPENDENT_AMBULATORY_CARE_PROVIDER_SITE_OTHER): Payer: Self-pay | Admitting: Audiology

## 2023-11-16 NOTE — Telephone Encounter (Signed)
 Patient called and left a voicemail message stating that the cable for the TV Adapter she was given to try did not make any difference.  Dr. Tiney called the [employer] representative and they are going to be sending the clinic a TV Adapter.  Dr. Tiney has agreed to loan it to the patient to try it to see if it will work for her.  I called the patient per Dr. Tex request and and left a voicemail message relaying this information to her. I also stated that we will call the patient when the loaner TV Adapter comes in.  I also sent a MyChart message.

## 2023-11-17 ENCOUNTER — Encounter (INDEPENDENT_AMBULATORY_CARE_PROVIDER_SITE_OTHER): Payer: Self-pay

## 2023-11-17 ENCOUNTER — Telehealth (INDEPENDENT_AMBULATORY_CARE_PROVIDER_SITE_OTHER): Payer: Self-pay | Admitting: Audiology

## 2023-11-17 ENCOUNTER — Ambulatory Visit (INDEPENDENT_AMBULATORY_CARE_PROVIDER_SITE_OTHER): Payer: Self-pay | Admitting: Audiology

## 2023-11-17 DIAGNOSIS — H903 Sensorineural hearing loss, bilateral: Secondary | ICD-10-CM

## 2023-11-17 NOTE — Telephone Encounter (Signed)
 Called patient and LVM to let patient know that we received the loaner TV Adapter from [place].  Dr. Tiney would like for her to try it to see if it will work for her.  Also sent MyChart msg.

## 2023-11-17 NOTE — Progress Notes (Signed)
  [address], [address] [place], [place] [address] [phone]  Hearing Aid Check     HAZYL MARSEILLE walked in to pick up a loaner TV accessory. She will call next week to let us  know if the fluttery sound persists.   Services fee: $0 was paid at checkout.  Recommend: Return for a hearing aid check , as needed. Return for a hearing evaluation and to see an ENT, if concerns with hearing changes arise.    Ciana Simmon MARIE LEROUX-MARTINEZ, AUD

## 2023-11-20 ENCOUNTER — Other Ambulatory Visit: Payer: Self-pay | Admitting: Family Medicine

## 2023-11-28 ENCOUNTER — Telehealth (INDEPENDENT_AMBULATORY_CARE_PROVIDER_SITE_OTHER): Payer: Self-pay | Admitting: Audiology

## 2023-11-28 ENCOUNTER — Encounter (INDEPENDENT_AMBULATORY_CARE_PROVIDER_SITE_OTHER): Payer: Self-pay

## 2023-11-28 NOTE — Telephone Encounter (Signed)
 Hi!  Patient called and wanted to thank you.  She also stated that she would like for you to dispose of the old TV Adapter.  Thank you. Randine

## 2023-11-28 NOTE — Telephone Encounter (Signed)
 Kristin Bonilla Received a voicemail message on the Clinical voicemailbox that Kristin Bonilla called to let us  know that her TV Adapter is working well.  Please advise of further action needed.

## 2023-11-28 NOTE — Telephone Encounter (Signed)
 Sent patient a MyChart message with the same information as the voicemail message.

## 2024-01-19 ENCOUNTER — Other Ambulatory Visit: Payer: Self-pay

## 2024-01-19 MED ORDER — MYRBETRIQ 25 MG PO TB24: 25.0000 mg | ORAL_TABLET | Freq: Every day | ORAL | 2 refills | Status: DC

## 2024-01-19 MED ORDER — DESVENLAFAXINE SUCCINATE ER 50 MG PO TB24: 50.0000 mg | ORAL_TABLET | Freq: Every day | ORAL | 3 refills | Status: DC

## 2024-01-23 ENCOUNTER — Other Ambulatory Visit: Payer: Self-pay

## 2024-01-23 MED ORDER — ROSUVASTATIN CALCIUM 5 MG PO TABS: 5.0000 mg | ORAL_TABLET | Freq: Every day | ORAL | 3 refills | Status: DC

## 2024-01-23 MED ORDER — LEVOCETIRIZINE DIHYDROCHLORIDE 5 MG PO TABS: 5.0000 mg | ORAL_TABLET | Freq: Every evening | ORAL | 3 refills | Status: AC

## 2024-01-23 MED ORDER — TRAZODONE HCL 100 MG PO TABS: 100.0000 mg | ORAL_TABLET | Freq: Every day | ORAL | 3 refills | Status: DC

## 2024-01-23 MED ORDER — MYRBETRIQ 25 MG PO TB24: 25.0000 mg | ORAL_TABLET | Freq: Every day | ORAL | 2 refills | Status: AC

## 2024-01-23 MED ORDER — FENOFIBRATE 160 MG PO TABS: 160.0000 mg | ORAL_TABLET | Freq: Every day | ORAL | 3 refills | Status: DC

## 2024-01-23 MED ORDER — MONTELUKAST SODIUM 10 MG PO TABS: 10.0000 mg | ORAL_TABLET | Freq: Every day | ORAL | 2 refills | Status: AC

## 2024-01-23 MED ORDER — OMEPRAZOLE 20 MG PO CPDR: 20.0000 mg | DELAYED_RELEASE_CAPSULE | Freq: Every day | ORAL | 3 refills | Status: AC

## 2024-01-23 MED ORDER — DESVENLAFAXINE SUCCINATE ER 50 MG PO TB24: 50.0000 mg | ORAL_TABLET | Freq: Every day | ORAL | 3 refills | Status: AC

## 2024-01-25 ENCOUNTER — Other Ambulatory Visit (HOSPITAL_COMMUNITY): Payer: Self-pay

## 2024-01-25 ENCOUNTER — Telehealth: Payer: Self-pay

## 2024-01-25 NOTE — Telephone Encounter (Signed)
 Pharmacy Patient Advocate Encounter   Received notification from [employer] [facility] KEY that prior authorization for [id number]  25MG  TABS s required/requested.   Insurance verification completed.   The patient is insured through [facility] [employer].   Per test claim: Refill too soon. PA is not needed at this time. Medication was filled 01/25/24. Next eligible fill date is 04/17/24.

## 2024-01-29 ENCOUNTER — Other Ambulatory Visit: Payer: Self-pay | Admitting: Family Medicine

## 2024-01-29 NOTE — Telephone Encounter (Signed)
Patient requesting 1 year supply.

## 2024-01-30 ENCOUNTER — Other Ambulatory Visit (HOSPITAL_COMMUNITY): Payer: Self-pay

## 2024-02-05 ENCOUNTER — Other Ambulatory Visit: Payer: Self-pay | Admitting: Family Medicine

## 2024-03-08 ENCOUNTER — Ambulatory Visit: Admitting: Family Medicine

## 2024-09-10 ENCOUNTER — Encounter: Admitting: Family Medicine
